# Patient Record
Sex: Female | Born: 1950 | Race: Black or African American | Hispanic: No | Marital: Married | Smoking: Former smoker
Health system: Southern US, Community
[De-identification: ages and names within clinical notes are randomized; demographics above are authoritative.]

## PROBLEM LIST (undated history)

## (undated) DIAGNOSIS — E113299 Type 2 diabetes mellitus with mild nonproliferative diabetic retinopathy without macular edema, unspecified eye: Secondary | ICD-10-CM

## (undated) DIAGNOSIS — E079 Disorder of thyroid, unspecified: Secondary | ICD-10-CM

## (undated) DIAGNOSIS — D509 Iron deficiency anemia, unspecified: Secondary | ICD-10-CM

## (undated) DIAGNOSIS — D8683 Sarcoid iridocyclitis: Secondary | ICD-10-CM

## (undated) DIAGNOSIS — H409 Unspecified glaucoma: Secondary | ICD-10-CM

## (undated) DIAGNOSIS — H269 Unspecified cataract: Secondary | ICD-10-CM

## (undated) DIAGNOSIS — K219 Gastro-esophageal reflux disease without esophagitis: Secondary | ICD-10-CM

## (undated) DIAGNOSIS — E785 Hyperlipidemia, unspecified: Secondary | ICD-10-CM

## (undated) DIAGNOSIS — K5790 Diverticulosis of intestine, part unspecified, without perforation or abscess without bleeding: Secondary | ICD-10-CM

## (undated) DIAGNOSIS — I10 Essential (primary) hypertension: Secondary | ICD-10-CM

## (undated) DIAGNOSIS — D860 Sarcoidosis of lung: Secondary | ICD-10-CM

## (undated) DIAGNOSIS — G4730 Sleep apnea, unspecified: Secondary | ICD-10-CM

## (undated) DIAGNOSIS — M5030 Other cervical disc degeneration, unspecified cervical region: Secondary | ICD-10-CM

## (undated) HISTORY — DX: Sarcoid iridocyclitis: D86.83

## (undated) HISTORY — DX: Other cervical disc degeneration, unspecified cervical region: M50.30

## (undated) HISTORY — DX: Sleep apnea, unspecified: G47.30

## (undated) HISTORY — DX: Iron deficiency anemia, unspecified: D50.9

## (undated) HISTORY — PX: OTHER SURGICAL HISTORY: SHX169

## (undated) HISTORY — PX: APPENDECTOMY: SHX54

## (undated) HISTORY — PX: TONSILLECTOMY: SUR1361

## (undated) HISTORY — PX: EYE SURGERY: SHX253

## (undated) HISTORY — DX: Unspecified glaucoma: H40.9

## (undated) HISTORY — DX: Sarcoidosis of lung: D86.0

## (undated) HISTORY — DX: Gastro-esophageal reflux disease without esophagitis: K21.9

## (undated) HISTORY — PX: VITRECTOMY: SHX106

## (undated) HISTORY — DX: Unspecified cataract: H26.9

## (undated) HISTORY — DX: Diverticulosis of intestine, part unspecified, without perforation or abscess without bleeding: K57.90

## (undated) HISTORY — PX: CATARACT EXTRACTION: SUR2

## (undated) HISTORY — DX: Hyperlipidemia, unspecified: E78.5

## (undated) HISTORY — PX: TOTAL ABDOMINAL HYSTERECTOMY: SHX209

## (undated) HISTORY — DX: Disorder of thyroid, unspecified: E07.9

## (undated) HISTORY — PX: SCLERAL BUCKLE: SHX5340

## (undated) HISTORY — PX: COLONOSCOPY: SHX174

## (undated) HISTORY — DX: Essential (primary) hypertension: I10

## (undated) HISTORY — PX: ABDOMINAL HYSTERECTOMY: SHX81

## (undated) HISTORY — DX: Type 2 diabetes mellitus with mild nonproliferative diabetic retinopathy without macular edema, unspecified eye: E11.3299

## (undated) HISTORY — PX: TUBAL LIGATION: SHX77

---

## 1998-07-22 ENCOUNTER — Ambulatory Visit (HOSPITAL_COMMUNITY): Admission: RE | Admit: 1998-07-22 | Discharge: 1998-07-22 | Payer: Self-pay | Admitting: Obstetrics and Gynecology

## 1998-08-06 ENCOUNTER — Ambulatory Visit (HOSPITAL_COMMUNITY): Admission: RE | Admit: 1998-08-06 | Discharge: 1998-08-06 | Payer: Self-pay | Admitting: Obstetrics and Gynecology

## 1998-08-09 ENCOUNTER — Inpatient Hospital Stay (HOSPITAL_COMMUNITY): Admission: RE | Admit: 1998-08-09 | Discharge: 1998-08-12 | Payer: Self-pay | Admitting: Obstetrics and Gynecology

## 1998-08-22 ENCOUNTER — Emergency Department (HOSPITAL_COMMUNITY): Admission: EM | Admit: 1998-08-22 | Discharge: 1998-08-22 | Payer: Self-pay | Admitting: Emergency Medicine

## 1999-03-31 ENCOUNTER — Encounter: Payer: Self-pay | Admitting: Emergency Medicine

## 1999-03-31 ENCOUNTER — Inpatient Hospital Stay (HOSPITAL_COMMUNITY): Admission: EM | Admit: 1999-03-31 | Discharge: 1999-04-01 | Payer: Self-pay | Admitting: Emergency Medicine

## 1999-04-17 ENCOUNTER — Emergency Department (HOSPITAL_COMMUNITY): Admission: EM | Admit: 1999-04-17 | Discharge: 1999-04-17 | Payer: Self-pay | Admitting: Emergency Medicine

## 1999-04-17 ENCOUNTER — Encounter: Payer: Self-pay | Admitting: Emergency Medicine

## 1999-09-19 ENCOUNTER — Ambulatory Visit (HOSPITAL_COMMUNITY): Admission: RE | Admit: 1999-09-19 | Discharge: 1999-09-19 | Payer: Self-pay | Admitting: Obstetrics and Gynecology

## 1999-09-19 ENCOUNTER — Encounter: Payer: Self-pay | Admitting: Obstetrics and Gynecology

## 2000-10-23 ENCOUNTER — Ambulatory Visit (HOSPITAL_COMMUNITY): Admission: RE | Admit: 2000-10-23 | Discharge: 2000-10-23 | Payer: Self-pay | Admitting: General Surgery

## 2000-10-23 ENCOUNTER — Encounter: Payer: Self-pay | Admitting: Internal Medicine

## 2001-03-25 ENCOUNTER — Ambulatory Visit (HOSPITAL_COMMUNITY): Admission: RE | Admit: 2001-03-25 | Discharge: 2001-03-25 | Payer: Self-pay | Admitting: Internal Medicine

## 2001-10-28 ENCOUNTER — Ambulatory Visit (HOSPITAL_COMMUNITY): Admission: RE | Admit: 2001-10-28 | Discharge: 2001-10-28 | Payer: Self-pay | Admitting: Obstetrics and Gynecology

## 2001-10-28 ENCOUNTER — Encounter: Payer: Self-pay | Admitting: Obstetrics and Gynecology

## 2002-10-17 ENCOUNTER — Other Ambulatory Visit: Admission: RE | Admit: 2002-10-17 | Discharge: 2002-10-17 | Payer: Self-pay | Admitting: Internal Medicine

## 2002-10-31 ENCOUNTER — Encounter: Payer: Self-pay | Admitting: Obstetrics and Gynecology

## 2002-10-31 ENCOUNTER — Ambulatory Visit (HOSPITAL_COMMUNITY): Admission: RE | Admit: 2002-10-31 | Discharge: 2002-10-31 | Payer: Self-pay | Admitting: Obstetrics and Gynecology

## 2004-10-11 ENCOUNTER — Ambulatory Visit (HOSPITAL_BASED_OUTPATIENT_CLINIC_OR_DEPARTMENT_OTHER): Admission: RE | Admit: 2004-10-11 | Discharge: 2004-10-11 | Payer: Self-pay | Admitting: Internal Medicine

## 2004-11-28 ENCOUNTER — Emergency Department (HOSPITAL_COMMUNITY): Admission: EM | Admit: 2004-11-28 | Discharge: 2004-11-28 | Payer: Self-pay | Admitting: Emergency Medicine

## 2005-12-09 ENCOUNTER — Ambulatory Visit (HOSPITAL_COMMUNITY): Admission: RE | Admit: 2005-12-09 | Discharge: 2005-12-09 | Payer: Self-pay | Admitting: Ophthalmology

## 2006-03-21 ENCOUNTER — Ambulatory Visit (HOSPITAL_COMMUNITY): Admission: RE | Admit: 2006-03-21 | Discharge: 2006-03-21 | Payer: Self-pay | Admitting: Internal Medicine

## 2006-03-21 ENCOUNTER — Encounter (INDEPENDENT_AMBULATORY_CARE_PROVIDER_SITE_OTHER): Payer: Self-pay | Admitting: *Deleted

## 2006-06-29 ENCOUNTER — Ambulatory Visit (HOSPITAL_COMMUNITY): Admission: RE | Admit: 2006-06-29 | Discharge: 2006-06-29 | Payer: Self-pay | Admitting: Internal Medicine

## 2007-06-07 ENCOUNTER — Ambulatory Visit (HOSPITAL_COMMUNITY): Admission: RE | Admit: 2007-06-07 | Discharge: 2007-06-07 | Payer: Self-pay | Admitting: Ophthalmology

## 2007-06-24 ENCOUNTER — Ambulatory Visit (HOSPITAL_COMMUNITY): Admission: RE | Admit: 2007-06-24 | Discharge: 2007-06-24 | Payer: Self-pay | Admitting: Ophthalmology

## 2007-07-03 ENCOUNTER — Ambulatory Visit (HOSPITAL_COMMUNITY): Admission: RE | Admit: 2007-07-03 | Discharge: 2007-07-03 | Payer: Self-pay | Admitting: Ophthalmology

## 2007-08-27 ENCOUNTER — Ambulatory Visit: Payer: Self-pay | Admitting: Internal Medicine

## 2008-04-22 ENCOUNTER — Ambulatory Visit (HOSPITAL_COMMUNITY): Admission: RE | Admit: 2008-04-22 | Discharge: 2008-04-22 | Payer: Self-pay | Admitting: Internal Medicine

## 2011-04-25 NOTE — Assessment & Plan Note (Signed)
[name]                             PULMONARY OFFICE NOTE   [name], Eileen Jefferson                     MRN:          [id number]  DATE:08/27/2007                            DOB:          May 14, 1951    PROBLEM LIST:  1. Sarcoid.  2. Obstructive sleep apnea.  3. Esophageal reflux.   HISTORY:  First visit for this woman since 2005, before moving to this  office.  She comes now with concern of cough.  Cough had been an issue  in the past.  She had had nocturnal polysomnogram on October 11, 2004,  documenting moderate obstructive apnea with an index of 16.7 per hour,  titrated to CPAP of 9, and she has continued using that.  Cough seems to  be worse when she lays down, and is said to have flared in the last few  weeks.  She has not recognized a trigger, has not had an infection, but  does notice occasional reflux.  Reflux had been thought to be the cause  of her cough in 2005 as well.  Her angiotensin-converting enzyme  inhibitor level in 2002 and sarcoid was thought to be inactive.  Chest x-  ray done at [facility] on June 05, 2007 was read as no active cardiopulmonary  disease, done preop for cataract surgery.  Lungs were clear.  She is  noted to be on lisinopril/hydrochlorothiazide and says she has been on  that for a year.  She started timolol eye drops 1 week ago.  She is  pretty clear that the current exacerbation of cough did not coincide  easily with those.  She does recognize seasonal rhinitis or postnasal  drip, and does not feel unusually short of breath.  There has been  little sputum, nothing purulent or bloody, no chest pain, leg pain,  edema, adenopathy, rash or fever.   MEDICATIONS:  1. CPAP at 9 CWP.  2. Lisinopril/hydrochlorothiazide 10/12.5.  3. Timolol eye drops 0.5% for left eye.  4. Acular for the left eye.   No medication allergy.   OBJECTIVE:  Weight 220 pounds, BP 112/62, pulse 62, room air saturation  100%.  She is obese in  no evident distress.  Palate spacing 3/4, but I can see the posterior pharyngeal wall is not  inflamed with no visible drainage.  Speech quality is normal.  There is  no stridor, thyromegaly or neck vein distension.  CHEST:  Clear to percussion and auscultation.  No cough demonstrated  until the end of our visit when she had 1 single dry cough episode.  Heart sounds regular without murmur or gallop.   IMPRESSION:  1. Cough, probably multifactorial with an active component of reflux.      The timing is not obvious for this to be related either to      lisinopril or timolol, although they may be aggravating factors.  2. There is past history of sarcoid and of obstructive sleep apnea on      continuous positive airway pressure.   PLAN:  1. For 3 weeks try changing lisinopril/hydrochlorothiazide to Benicar  20 mg/hydrochlorothiazide 12.5 mg once daily as discussed.  2. After that trial, if there is no difference in her cough, then she      will try a reflux management program, following reflux precautions,      elevating head of bed on brick and taking over-the-counter Prilosec      once daily.  3. Schedule return 2 months, earlier p.r.n.     Clinton D. Maple Hudson, MD, Tonny Bollman, FACP  Electronically Signed    CDY/MedQ  DD: 08/27/2007  DT: 08/27/2007  Job #: [id number]   cc:   Jonita Albee, M.D.

## 2011-04-25 NOTE — Op Note (Signed)
NAME:  Eileen Jefferson, Eileen Jefferson              ACCOUNT NO.:  [id number]   MEDICAL RECORD NO.:  [id number]          PATIENT TYPE:  AMB   LOCATION:  SDS                          FACILITY:  [facility]   PHYSICIAN:  Jillyn Hidden A. Rankin, M.D.   DATE OF BIRTH:  1951-09-18   DATE OF PROCEDURE:  06/24/2007  DATE OF DISCHARGE:                               OPERATIVE REPORT   PREOPERATIVE DIAGNOSES:  1. Dense, nonclearing vitreous hemorrhage, left eye.  2. 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   POSTOPERATIVE DIAGNOSES:  1. Dense, nonclearing vitreous hemorrhage, left eye.  2. 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  3. Retinal hole at the base of the fibrovascular proliferations      elsewhere temporally at the 9-o'clock position anterior to the      equator.   PROCEDURE:  1. Posterior vitrectomy with membrane peel, epiretinal membrane and      fibrovascular proliferations, OS (left eye).  2. Endolaser panphotocoagulation, OS (left eye).   SURGEON:  Alford Highland. Rankin, M.D.   ANESTHESIA:  General endotracheal anesthesia.   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  She understands this is an attempt to clear  the vitreous of opacification, but also to remove the fibrovascular  proliferations.  She understands the risks of anesthesia, including the  rare occurrence of death, but also to the eye, including but not limited  to hemorrhage, infection, scarring, need for further surgery, no change  in vision, loss of vision and progression of disease despite  intervention.  Appropriate signed consent was obtained and the patient  was taken to the operating room.   DESCRIPTION OF PROCEDURES:  In the operating room, appropriate  monitoring was followed by mild sedation and then general endotracheal  anesthesia.  The left periocular region was sterilely prepped and draped  in the usual ophthalmic fashion.  A lid speculum was applied.  A 25-  gauge trocar was placed in the inferotemporal quadrant.  A superior  trocar was applied.  Core vitrectomy was then begun.  Physician-induced  posterior hyaloid removal was necessary, and this was carried out at the  posterior pole, and then carried anteriorly anterior to the equator 360  degrees.  The vitreous skirt was trimmed.  The fibrovascular  proliferation temporally was identified and transected using scissors  delamination, as well as small-point vitrectomy.   The vitreous skirt was trimmed 360 degrees.   The anterior hyaloid was removed.  A fluid-air exchange was completed so  that the retinal hole could be drained, and then laser photocoagulation  placed in a retinopexy fashion around that, which was done without  difficulty, and thereafter, an air-fluid exchange, and fluid left in the  eye so as to leave the eye filled with fluid.   At this time, the retina was attached completely.  Hemostasis was  spontaneous.  The vitreous fluid was clear.  The superior trocar was  removed.  The infusion was then removed.  Subconjunctival Decadron  applied.  A sterile patch and a Fox shield were applied.  The patient  was awakened from anesthesia without difficulty and taken to the PACU.      Alford Highland Rankin, M.D.  Electronically Signed     GAR/MEDQ  D:  06/24/2007  T:  06/24/2007  Job:  [id number]

## 2011-04-25 NOTE — Op Note (Signed)
NAME:  Eileen Jefferson, Eileen Jefferson              ACCOUNT NO.:  [id number]   MEDICAL RECORD NO.:  [id number]          PATIENT TYPE:  AMB   LOCATION:  SDS                          FACILITY:  [facility]   PHYSICIAN:  Salley Scarlet., M.D.DATE OF BIRTH:  May 06, 1951   DATE OF PROCEDURE:  06/07/2007  DATE OF DISCHARGE:                               OPERATIVE REPORT   PREOPERATIVE DIAGNOSIS:  Immature cataract, left eye.   POSTOPERATIVE DIAGNOSIS:  Immature cataract, left eye.   OPERATION:  Kellman phacoemulsification cataract, left eye, with  intraocular lens implantation.   ANESTHESIA:  Local using Xylocaine 2% with Marcaine 0.75% and Wydase.   JUSTIFICATION FOR PROCEDURE:  This is a 60 year old lady who has been  followed for several years for progressive cataract formation.  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  She has recently seen Dr. Luciana Axe who recommended  vitrectomy because of proliferative disease.  However, because of the  posterior subcapsular cataract, he is recommending that I remove the  cataract at this time so it would enable him to see better to do his  vitrectomy.  The situation has been carefully explained to the patient.  Although she has a visual acuity best corrected to 20/50, she  understands the reason for surgery at this time and she is, therefore,  admitted for cataract extraction of the left eye with intraocular lens  implantation.   PROCEDURE:  Under the influence of IV sedation, a Van Lint akinesia and  retrobulbar anesthesia was given.  The patient was prepped and draped in  the usual manner.  The lid speculum was inserted under the upper and  lower lid of the left eye and a 4-0 silk traction suture was passed  through the belly of the superior rectus muscle for retraction.  A small  fornix based conjunctival flap was turned and hemostasis achieved using  cautery.  An incision was made in the sclera at  the limbus.  This  incision was dissected down to clear cornea using a crescent blade.  A  sideport incision was made at the 1:30 o'clock position.  Ocucoat was  injected into the eye through the sideport incision.  The anterior  chamber was then entered through the corneoscleral tunnel incision at  11:30 o'clock position using a 2.7 mm keratome.  An anterior capsulotomy  was done using a bent 25 gauge needle.  The nucleus was hydrodissected  using Xylocaine.  The KTP handpiece was passed in the eye and the  nucleus was emulsified without difficulty.  The residual cortical  material was aspirated.  The posterior capsule was polished using an  olive tipped polisher.  The anterior chamber was reformed by using  Ocucoat.  The wound was widened slightly to accommodate a foldable  acrylic lens.  This lens was seated into the eye behind the iris without  difficulty.  The anterior chamber was reformed and the pupil was  constricted using Miochol.  The lips of the wound were hydrated and  tested to make sure there was no leak.  After ascertaining there was no  leak, the conjunctiva was closed over the wound using thermal cautery.  1 mL of Celestone and 0.5 mL of gentamicin were injected  subconjunctivally.  Maxitrol ophthalmic ointment and Polocaine ointment  were applied along with a patch and Fox shield.  The patient tolerated  the procedure well and  was discharged to the post anesthesia recovery room in satisfactory  condition.  She is instructed to rest today, to take Vicodin every 4  hours as needed for pain, and to see me in the office tomorrow for  further evaluation.   DISCHARGE DIAGNOSIS:  Immature cataract, left eye.      Salley Scarlet., M.D.  Electronically Signed     TB/MEDQ  D:  06/08/2007  T:  06/08/2007  Job:  [id number]

## 2011-04-28 NOTE — Procedures (Signed)
NAME:  AMMI, HUTT NO.:  [id number]   MEDICAL RECORD NO.:  [id number]          PATIENT TYPE:  OUT   LOCATION:  SLEEP CENTER                 FACILITY:  [facility]   PHYSICIAN:  Clinton D. Maple Hudson, M.D. DATE OF BIRTH:  1951/07/13   DATE OF STUDY:  10/11/2004                              NOCTURNAL POLYSOMNOGRAM   REFERRING PHYSICIAN:  Dr. Jetty Duhamel.   INDICATION FOR STUDY:  Hypersomnia with sleep apnea.   EPWORTH SLEEPINESS SCORE:  14/24   BMI:  39.   WEIGHT:  215 pounds.   SLEEP ARCHITECTURE:  Total sleep time 277 minutes with sleep efficiency  76%.  Stage I was 10%, Stage II 43%, Stages III and IV 31%, REM was 17% of  total sleep time.  Sleep latency 1.5 minutes.  REM latency 120 minutes.  Awake after sleep onset 81 minutes, arousal index 14.   RESPIRATORY DATA:  Split-study protocol. RDI 16.7/hr indicating mild to  moderate obstructive sleep apnea/hypopnea syndrome before CPAP. This  included 4 obstructive apneas and 36 hypopneas before CPAP. Events were not  positional. REM RDI was 28.7. CPAP was titrated to 9 CWP, RDI 2.5/hr using a  medium Respironics nasal mask.   OXYGEN DATA:  Extremely loud snoring with oxygen desaturation to a nadir of  85% before CPAP.  A heated humidifier was used.   CARDIAC DATA:  Normal cardiac rhythm.   MOVEMENT/PARASOMNIA:  Occasional leg jerks.   IMPRESSION/RECOMMENDATION:  Moderate obstructive sleep apnea/hypopnea  syndrome, respiratory disturbance index 16.7/hr with desaturation to 85%.  CPAP control at 9 CWP, respiratory disturbance index 2.5/hr, using a medium  Respironics Nasal Mask with heated humidifier.                                                           Clinton D. Maple Hudson, M.D.  Diplomate, American Board  CDY/MEDQ  D:  10/16/2004 09:49:49  T:  10/17/2004 07:58:41  Job:  [id number]

## 2011-06-13 ENCOUNTER — Encounter: Payer: Self-pay | Admitting: Internal Medicine

## 2011-06-13 ENCOUNTER — Ambulatory Visit (INDEPENDENT_AMBULATORY_CARE_PROVIDER_SITE_OTHER): Payer: Medicare Other | Admitting: Internal Medicine

## 2011-06-13 DIAGNOSIS — G4730 Sleep apnea, unspecified: Secondary | ICD-10-CM

## 2011-06-13 DIAGNOSIS — D509 Iron deficiency anemia, unspecified: Secondary | ICD-10-CM

## 2011-06-13 DIAGNOSIS — K222 Esophageal obstruction: Secondary | ICD-10-CM

## 2011-06-13 DIAGNOSIS — K5730 Diverticulosis of large intestine without perforation or abscess without bleeding: Secondary | ICD-10-CM

## 2011-06-13 MED ORDER — PEG-KCL-NACL-NASULF-NA ASC-C 100 G PO SOLR: 1.0000 | Freq: Once | ORAL | Status: DC

## 2011-06-13 NOTE — Patient Instructions (Signed)
You have been scheduled for a Upper Endoscopy/ Colonoscopy with propofol. Separate instructions given. Pick up your prep kit from your pharmacy.  Hold iron supplement one week prior to your procedures. cc: Lesle Chris, MD

## 2011-06-15 ENCOUNTER — Encounter: Payer: Self-pay | Admitting: Internal Medicine

## 2011-06-15 NOTE — Progress Notes (Signed)
HISTORY OF PRESENT ILLNESS:  Eileen Jefferson is a 60 y.o. female with multiple medical problems as listed below. She is sent today for consultation regarding probable iron deficiency anemia. The patient was evaluated in December 2003 for anemia with a hemoglobin of 11.2, MCV 70.2, and iron saturation 12%. In January 2004 she underwent complete colonoscopy. This was negative except for left-sided diverticulosis. Upper endoscopy revealed an incidental esophageal stricture was otherwise normal. She was placed on iron and sent back to her primary provider. Currently with a different primary provider. Laboratories in May of 2012 reveal hemoglobin 11.2 and MCV 70.8. BUN and creatinine normal. Iron saturation low at 10%. She was placed on this appointment made. Her GI review of systems is remarkable only for occasional regurgitation and belching. She has known hemorrhoids. She denies melena, hematochezia, abdominal pain, change in bowel habits, or weight loss. No new medications. She denies aspirin or NSAID use. No family history of colon cancer.  REVIEW OF SYSTEMS:  All non-GI ROS negative except for cough and visual impairment  Past Medical History  Diagnosis Date  . Pulmonary sarcoidosis   . Diverticulosis   . Iron deficiency anemia   . Hyperlipidemia   . HTN (hypertension)   . Sleep apnea     Past Surgical History  Procedure Date  . C-section     x 2  . Appendectomy   . Tonsillectomy   . Total abdominal hysterectomy   . Tubal ligation   . Cataract extraction     bilateral  . Vitrectomy     bilateral  . Scleral buckle     right    Social History Eileen Jefferson  reports that she has never smoked. She has never used smokeless tobacco. She reports that she does not drink alcohol or use illicit drugs.  family history includes Clotting disorder in her mother; Colon polyps in her mother; Diabetes in her brother; Heart disease in her brother; and Stroke in her maternal grandmother.   There is no history of Colon cancer.  Allergies  Allergen Reactions  . Iohexol Other (See Comments)    " made me feel like I was burning inside"       PHYSICAL EXAMINATION: Vital signs: BP 130/76  Pulse 84  Ht 5\' 2"  (1.575 m)  Wt 218 lb (98.884 kg)  BMI 39.87 kg/m2  Constitutional: generally well-appearing, no acute distress Psychiatric: alert and oriented x3, cooperative Eyes: extraocular movements intact, anicteric, conjunctiva pink Mouth: oral pharynx moist, no lesions Neck: supple no lymphadenopathy Cardiovascular: heart regular rate and rhythm, no murmur Lungs: clear to auscultation bilaterally Abdomen: soft, nontender, nondistended, no obvious ascites, no peritoneal signs, normal bowel sounds, no organomegaly Rectal: Deferred until colonoscopy Extremities: no lower extremity edema bilaterally Skin: no lesions on visible extremities Neuro: No focal deficits.   ASSESSMENT:  #1. Microcytic anemia with low iron saturation #2. Colonoscopy in 2004 revealing diverticulosis #3. Upper endoscopy in 2004 revealing peptic stricture #4. Multiple comorbidities including sleep apnea and obesity   PLAN:  #1. Colonoscopy and upper endoscopy.The nature of the procedures, as well as the risks, benefits, and alternatives were carefully and thoroughly reviewed with the patient. Ample time for discussion and questions allowed. The patient understood, was satisfied, and agreed to proceed. Movi prep prescribed. The patient instructed on its use #2. Procedural sedation with propofol via CRNA supervision given the high-risk with sleep apnea and obesity. Discussed with patient. She is agreeable.

## 2011-06-20 ENCOUNTER — Encounter: Payer: Self-pay | Admitting: Internal Medicine

## 2011-07-28 ENCOUNTER — Encounter: Payer: Self-pay | Admitting: Internal Medicine

## 2011-07-28 ENCOUNTER — Ambulatory Visit (AMBULATORY_SURGERY_CENTER): Payer: Medicare Other | Admitting: Internal Medicine

## 2011-07-28 VITALS — BP 127/70 | HR 85 | Temp 98.2°F | Resp 18 | Ht 62.0 in | Wt 218.0 lb

## 2011-07-28 DIAGNOSIS — K5730 Diverticulosis of large intestine without perforation or abscess without bleeding: Secondary | ICD-10-CM

## 2011-07-28 DIAGNOSIS — R112 Nausea with vomiting, unspecified: Secondary | ICD-10-CM

## 2011-07-28 DIAGNOSIS — D649 Anemia, unspecified: Secondary | ICD-10-CM

## 2011-07-28 DIAGNOSIS — D509 Iron deficiency anemia, unspecified: Secondary | ICD-10-CM

## 2011-07-28 MED ORDER — SODIUM CHLORIDE 0.9 % IV SOLN: 500.0000 mL | INTRAVENOUS | Status: DC

## 2011-07-28 MED ORDER — PROMETHAZINE HCL 25 MG/ML IJ SOLN: INTRAMUSCULAR | Status: DC

## 2011-07-28 NOTE — Progress Notes (Signed)
11:00am    Patient is in recovery room and states extreme nausea.   Dr. Marina Goodell was informed, and a verbal order was received for phenergan 12.5mg  IV stat for pt.    Phenergan 12.5 obtained and diluted with 20 ml of sterile water; given slowly IV to patient.

## 2011-07-28 NOTE — Patient Instructions (Signed)
Your upper scope was normal, but Dr. Marina Goodell did do some biopsies to r/o celiac disease.  You will receive the results by mail within 2 weeks.   Your colonoscopy did show some diverticulosis.    It would be beneficial to you to increase the fiber in your diet.  Resume your routine medications today.  Read the handouts given to you by your recovery room nurse.  IF you have any questions or concerns, please call us at [phone].  Thank-you.

## 2011-07-31 ENCOUNTER — Telehealth: Payer: Self-pay

## 2011-07-31 NOTE — Telephone Encounter (Signed)

## 2011-09-26 LAB — BASIC METABOLIC PANEL
BUN: 11
Chloride: 101
Creatinine, Ser: 0.99
GFR calc Af Amer: >60
GFR calc non Af Amer: 58 — ABNORMAL LOW

## 2011-09-26 LAB — CBC
Hemoglobin: 10.4 — ABNORMAL LOW
RBC: 4.73
WBC: 6.9

## 2011-09-27 LAB — CBC
HCT: 36.3
Hemoglobin: 11.2 — ABNORMAL LOW
MCHC: 30.9
RDW: 15.9 — ABNORMAL HIGH

## 2011-09-27 LAB — BASIC METABOLIC PANEL
CO2: 29
Chloride: 99
Glucose, Bld: 103 — ABNORMAL HIGH
Potassium: 3.5
Sodium: 135

## 2011-09-27 LAB — URINALYSIS, ROUTINE W REFLEX MICROSCOPIC
Bilirubin Urine: NEGATIVE
Glucose, UA: NEGATIVE
Hgb urine dipstick: NEGATIVE
Protein, ur: NEGATIVE
Specific Gravity, Urine: 1.015 (ref 1.005–1.035)
Urobilinogen, UA: 0.2

## 2012-01-29 ENCOUNTER — Ambulatory Visit (INDEPENDENT_AMBULATORY_CARE_PROVIDER_SITE_OTHER): Payer: Medicare Other | Admitting: Family Medicine

## 2012-01-29 DIAGNOSIS — R05 Cough: Secondary | ICD-10-CM

## 2012-01-29 DIAGNOSIS — J029 Acute pharyngitis, unspecified: Secondary | ICD-10-CM

## 2012-01-29 DIAGNOSIS — D869 Sarcoidosis, unspecified: Secondary | ICD-10-CM

## 2012-01-29 MED ORDER — AZITHROMYCIN 250 MG PO TABS: ORAL_TABLET | ORAL | Status: AC

## 2012-01-29 NOTE — Progress Notes (Signed)
  Subjective:    Patient ID: Eileen Jefferson, female    DOB: [birth year], 61 y.o.   MRN: [id number]  HPI  Eileen Jefferson is a 61 y.o. female Sore/scratchy throat x 3 days.  Coughing yellow phlegm.  Hx sarcoidosis, but usually affects eyes.  Has not wheezed in years No fever/wheeze currently.  Tx: Nyquil  Son with cold now.    Review of Systems  Constitutional: Negative for fever and chills.  HENT: Positive for congestion, sore throat, voice change and postnasal drip. Negative for rhinorrhea, sneezing and sinus pressure.   Respiratory: Positive for cough. Negative for chest tightness, shortness of breath and wheezing.   Cardiovascular: Negative for chest pain.  Skin: Negative for rash.  .     Objective:   Physical Exam  Constitutional: She is oriented to person, place, and time. She appears well-developed and well-nourished. No distress.  HENT:  Head: Normocephalic and atraumatic.  Right Ear: Hearing, tympanic membrane, external ear and ear canal normal.  Left Ear: Hearing, tympanic membrane, external ear and ear canal normal.  Nose: Nose normal.  Mouth/Throat: Oropharynx is clear and moist. No oropharyngeal exudate.  Eyes: Conjunctivae and EOM are normal. Pupils are equal, round, and reactive to light.  Cardiovascular: Normal rate, regular rhythm, normal heart sounds and intact distal pulses.   No murmur heard. Pulmonary/Chest: Effort normal and breath sounds normal. No respiratory distress. She has no wheezes. She has no rhonchi.  Neurological: She is alert and oriented to person, place, and time.  Skin: Skin is warm and dry. No rash noted.  Psychiatric: She has a normal mood and affect. Her behavior is normal.    Results for orders placed in visit on 01/29/12  POCT RAPID STREP A (OFFICE)      Component Value Range   Rapid Strep A Screen Negative  Negative          Assessment & Plan:   1. Pharyngitis  POCT rapid strep A  2. Cough    3. Sarcoid     Likely viral  at present.  Sx care - fluids,lozenges, rest, rtc precautions.  Hx of sarcoid, but no recent flare.  Rx Zpak # 1, can start in next 3 days if not improving. Return to the clinic or go to the nearest emergency room if symptoms worsen or new symptoms occur.

## 2012-01-29 NOTE — Patient Instructions (Signed)
Use lozenges, drink plenty of fluids for sore throat.  Can try mucinex over the counter as needed for cough, but if any fever, shortness of breath, or wheezing - return to clinic or ER.  Can fill antibiotic if not improving in few days.  Sore Throat Sore throats may be caused by bacteria and viruses. They may also be caused by:  Smoking.   Pollution.   Allergies.  If a sore throat is due to strep infection (a bacterial infection), you may need:  A throat swab.   A culture test to verify the strep infection.  You will need one of these:  An antibiotic shot.   Oral medicine for a full 10 days.  Strep infection is very contagious. A doctor should check any close contacts who have a sore throat or fever. A sore throat caused by a virus infection will usually last only 3-4 days. Antibiotics will not treat a viral sore throat.  Infectious mononucleosis (a viral disease), however, can cause a sore throat that lasts for up to 3 weeks. Mononucleosis can be diagnosed with blood tests. You must have been sick for at least 1 week in order for the test to give accurate results. HOME CARE INSTRUCTIONS   To treat a sore throat, take mild pain medicine.   Increase your fluids.   Eat a soft diet.   Do not smoke.      Gargling with warm water or salt water (1 tsp. salt in 8 oz. water) can be helpful.   Try throat sprays or lozenges or sucking on hard candy to ease the symptoms.  Call your doctor if your sore throat lasts longer than 1 week.  SEEK IMMEDIATE MEDICAL CARE IF:  You have difficulty breathing.   You have increased swelling in the throat.   You have pain so severe that you are unable to swallow fluids or your saliva.   You have a severe headache, a high fever, vomiting, or a red rash.  Document Released: 01/04/2005 Document Revised: 08/09/2011 Document Reviewed: 11/14/2007 [facility] Patient Information 2012 [place], [place].

## 2012-04-29 ENCOUNTER — Other Ambulatory Visit: Payer: Self-pay | Admitting: Emergency Medicine

## 2012-10-23 ENCOUNTER — Encounter: Payer: Self-pay | Admitting: Pulmonary Disease

## 2012-10-23 ENCOUNTER — Ambulatory Visit (INDEPENDENT_AMBULATORY_CARE_PROVIDER_SITE_OTHER): Payer: BC Managed Care – PPO | Admitting: Pulmonary Disease

## 2012-10-23 VITALS — BP 124/82 | HR 93 | Temp 98.9°F | Ht 62.0 in | Wt 230.0 lb

## 2012-10-23 DIAGNOSIS — G4733 Obstructive sleep apnea (adult) (pediatric): Secondary | ICD-10-CM | POA: Insufficient documentation

## 2012-10-23 NOTE — Patient Instructions (Signed)
Will arrange for sleep study Will call to arrange for follow up after sleep study reviewed 

## 2012-10-23 NOTE — Progress Notes (Deleted)
  Subjective:    Patient ID: Eileen Jefferson, female    DOB: [birth year], 61 y.o.   MRN: [id number]  HPI    Review of Systems  Constitutional: Negative for fever and unexpected weight change.  HENT: Positive for rhinorrhea. Negative for ear pain, nosebleeds, congestion, sore throat, sneezing, trouble swallowing, dental problem, postnasal drip and sinus pressure.   Eyes: Negative for redness and itching.  Respiratory: Positive for cough. Negative for chest tightness, shortness of breath and wheezing.   Cardiovascular: Negative for palpitations and leg swelling.  Gastrointestinal: Negative for nausea and vomiting.  Genitourinary: Negative for dysuria.  Musculoskeletal: Positive for myalgias and arthralgias. Negative for joint swelling.  Skin: Negative for rash.  Neurological: Negative for headaches.  Hematological: Does not bruise/bleed easily.  Psychiatric/Behavioral: Negative for dysphoric mood. The patient is not nervous/anxious.        Objective:   Physical Exam        Assessment & Plan:

## 2012-10-23 NOTE — Progress Notes (Signed)
Primary Care Physician:   Referring provider:    Chief Complaint  Patient presents with  . Advice Only    former pt of [facility] last seen 2008. last sleep study 10/11/2004.     History of Present Illness: Eileen Jefferson is a 61 y.o. female for evaluation of sleep apnea.  She had a sleep study in 2005, and was diagnosed with sleep apnea.  She was started on CPAP with full face mask.  She only tried one type of mask.  She did not like the way the mask fit, and felt like the pressure was too much.  As a result she stopped using CPAP.  Since then her sleep has gotten worse.  Her family reports that she continues to snore and stop breathing while asleep.  She feels tired during the day.  She falls asleep easily while watching TV or reading.  She naps for an hour every day since she retired.  She used to work night shift.  She goes to bed at 11 pm.  She does not use anything to help sleep.  She falls asleep quickly.  She wakes up several times.  She gets up at 3 am to see her husband off to work, and then goes back to bed until 730 am.  She sometimes feel tired in the morning, but denies morning headache.  Her Epworth score is 12 out of 24.  The patient denies sleep walking, sleep talking, bruxism, or nightmares.  There is no history of restless legs.  The patient denies sleep hallucinations, sleep paralysis, or cataplexy.  She has a history of sarcoidosis with ocular involvement.  As a result she can not drive due to visual problems.  Tests: PSG 10/11/04>>AHI 16.7, SpO2 low 85%  Past Medical History  Diagnosis Date  . Pulmonary sarcoidosis   . Diverticulosis   . Iron deficiency anemia   . Hyperlipidemia   . HTN (hypertension)   . Sleep apnea     Past Surgical History  Procedure Date  . C-section     x 2  . Appendectomy   . Tonsillectomy   . Total abdominal hysterectomy   . Tubal ligation   . Cataract extraction     bilateral  . Vitrectomy     bilateral  . Scleral buckle    right    Current Outpatient Prescriptions on File Prior to Visit  Medication Sig Dispense Refill  . brimonidine-timolol (COMBIGAN) 0.2-0.5 % ophthalmic solution Place 1 drop into the left eye 2 (two) times daily.        . brinzolamide (AZOPT) 1 % ophthalmic suspension Place 1 drop into the left eye 2 (two) times daily.        Marland Kitchen lisinopril-hydrochlorothiazide (PRINZIDE,ZESTORETIC) 10-12.5 MG per tablet TAKE 1 TABLET BY MOUTH DIALY  30 tablet  0  . prednisoLONE acetate (PRED FORTE) 1 % ophthalmic suspension Place 1 drop into the right eye daily.        . ferrous sulfate 325 (65 FE) MG tablet Take 325 mg by mouth daily.          Allergies  Allergen Reactions  . Iohexol Other (See Comments)    " made me feel like I was burning inside"    Family History  Problem Relation Age of Onset  . Colon polyps Mother     brothers x2  . Colon cancer Neg Hx   . Heart disease Brother     mother, MGM  . Diabetes Brother  x 2, MGM  . Clotting disorder Mother     brother x 2, MGM  . Stroke Maternal Grandmother     History  Substance Use Topics  . Smoking status: Former Smoker    Types: Cigarettes    Quit date: 12/11/1968  . Smokeless tobacco: Never Used  . Alcohol Use: No    Review of Systems  Constitutional: Negative for fever and unexpected weight change.  HENT: Positive for rhinorrhea. Negative for ear pain, nosebleeds, congestion, sore throat, sneezing, trouble swallowing, dental problem, postnasal drip and sinus pressure.   Eyes: Negative for redness and itching.  Respiratory: Positive for cough. Negative for chest tightness, shortness of breath and wheezing.   Cardiovascular: Negative for palpitations and leg swelling.  Gastrointestinal: Negative for nausea and vomiting.  Genitourinary: Negative for dysuria.  Musculoskeletal: Positive for myalgias and arthralgias. Negative for joint swelling.  Skin: Negative for rash.  Neurological: Negative for headaches.  Hematological: Does  not bruise/bleed easily.  Psychiatric/Behavioral: Negative for dysphoric mood. The patient is not nervous/anxious.    Physical Exam: Filed Vitals:   10/23/12 1615  BP: 124/82  Pulse: 93  Temp: 98.9 F (37.2 C)  Height: 5\' 2"  (1.575 m)  Weight: 230 lb (104.327 kg)  SpO2: 94%  ,  Current Encounter SPO2  10/23/12 1615 94%  07/28/11 0956 100%    Wt Readings from Last 3 Encounters:  10/23/12 230 lb (104.327 kg)  01/29/12 228 lb 6.4 oz (103.602 kg)  07/28/11 218 lb (98.884 kg)    Body mass index is 42.07 kg/(m^2).   General - No distress ENT - No sinus tenderness, no oral exudate, no LAN, no thyromegaly Cardiac - s1s2 regular, no murmur, pulses symmetric Chest - No wheeze/rales/dullness, good air entry, normal respiratory excursion Back - No focal tenderness Abd - Soft, non-tender, no organomegaly, + bowel sounds Ext - No edema Neuro - Normal strength, cranial nerves intact Skin - No rashes Psych - Normal mood, and behavior.   Lab Results  Component Value Date   WBC 6.9 06/24/2007   HGB 10.4* 06/24/2007   HCT 32.9* 06/24/2007   MCV 69.5* 06/24/2007   PLT 310 06/24/2007    Lab Results  Component Value Date   CREATININE 0.99 06/24/2007   BUN 11 06/24/2007   NA 135 06/24/2007   K 3.7 06/24/2007   CL 101 06/24/2007   CO2 28 06/24/2007    Assessment/Plan:  Coralyn Helling, MD [place] [facility]/Critical Care/Sleep Pager:  [phone] 10/23/2012, 4:35 PM

## 2012-10-23 NOTE — Assessment & Plan Note (Signed)
She has prior history of sleep apnea, but had trouble tolerating CPAP mask.  She has continued snoring, witnessed apnea, sleep disruption, and daytime sleepiness.  She has history of hypertension and sarcoidosis.  I am concerned she still has sleep apnea.  I have explained how sleep apnea can affect the patient's health.  Driving precautions and importance of weight loss were discussed.  Treatment options for sleep apnea were reviewed.  To further assess will arrange for in lab sleep study.

## 2012-10-31 ENCOUNTER — Ambulatory Visit (HOSPITAL_BASED_OUTPATIENT_CLINIC_OR_DEPARTMENT_OTHER): Payer: BC Managed Care – PPO | Attending: Pulmonary Disease | Admitting: Radiology

## 2012-10-31 VITALS — Ht 62.0 in | Wt 230.0 lb

## 2012-10-31 DIAGNOSIS — I10 Essential (primary) hypertension: Secondary | ICD-10-CM | POA: Insufficient documentation

## 2012-10-31 DIAGNOSIS — Z9119 Patient's noncompliance with other medical treatment and regimen: Secondary | ICD-10-CM | POA: Insufficient documentation

## 2012-10-31 DIAGNOSIS — G4733 Obstructive sleep apnea (adult) (pediatric): Secondary | ICD-10-CM | POA: Insufficient documentation

## 2012-10-31 DIAGNOSIS — Z91199 Patient's noncompliance with other medical treatment and regimen due to unspecified reason: Secondary | ICD-10-CM | POA: Insufficient documentation

## 2012-10-31 DIAGNOSIS — D869 Sarcoidosis, unspecified: Secondary | ICD-10-CM | POA: Insufficient documentation

## 2012-10-31 DIAGNOSIS — E785 Hyperlipidemia, unspecified: Secondary | ICD-10-CM | POA: Insufficient documentation

## 2012-11-13 ENCOUNTER — Telehealth: Payer: Self-pay | Admitting: Pulmonary Disease

## 2012-11-13 DIAGNOSIS — G4733 Obstructive sleep apnea (adult) (pediatric): Secondary | ICD-10-CM

## 2012-11-13 NOTE — Procedures (Signed)
NAME:  Eileen, Jefferson NO.:  [id number]  MEDICAL RECORD NO.:  [id number]          PATIENT TYPE:  OUT  LOCATION:  SLEEP CENTER                 FACILITY:  [facility]  PHYSICIAN:  Coralyn Helling, MD        DATE OF BIRTH:  [birth year]  DATE OF STUDY:  10/31/2012                           NOCTURNAL POLYSOMNOGRAM  REFERRING PHYSICIAN:  Coralyn Helling, MD  FACILITY:  [facility]  REFERRING PHYSICIAN:  Coralyn Helling, MD  INDICATION:  [name] is a 61 year old female, who has a history of hypertension and sarcoidosis.  She was also diagnosed with obstructive sleep apnea after having a sleep study on October 11, 2004.  This showed moderate sleep apnea with an apnea/hypopnea index of 16.7, oxygen saturation nadir of 85%.  She had difficulty tolerating CPAP therapy at that time.  She continued to have snoring, sleep disruption, and daytime sleepiness.  She is therefore referred to the sleep lab for further evaluation of hypersomnia with obstructive sleep apnea.  Height is 5 feet 2 inches, weight is 230 pounds, BMI is 42, neck size is 15 inches.  MEDICATIONS:  Lisinopril/hydrochlorothiazide, prednisone, Azopt, and Combigan.  EPWORTH SLEEPINESS SCORE:  9.  SLEEP ARCHITECTURE:  Total recording time was 325 minutes.  Total sleep time was 311 minutes.  Sleep efficiency was 83%.  Sleep latency was 17 minutes.  REM latency was 111 minutes.  The study was notable for lack of slow-wave sleep.  She slept predominantly in the nonsupine position.  RESPIRATORY DATA:  The average respiratory rate was 18.  Loud snoring was noted by the technician.  The overall apnea/hypopnea index was 12.9. The events were exclusively obstructive in nature.  She had a preponderance of respiratory events during REM sleep.  There REM apnea/hypopnea index was 45.  The non-REM apnea/hypopnea index was 1.3.  OXYGEN DATA:  The baseline oxygenation was 91%.  The oxygen saturation nadir was 59%.  The study  was conducted without the use of supplemental oxygen.  CARDIAC DATA:  The average heart rate is 80 and the rhythm strip showed sinus rhythm.  MOVEMENT/PARASOMNIA:  The periodic limb movement index was 0 and the patient had no restroom trips.  IMPRESSION:  This study shows evidence for mild obstructive sleep apnea with an apnea/hypopnea index of 13 and oxygen saturation nadir of 59%. She did have predominance of her respiratory events during REM sleep.  In addition to diet, exercise, and weight reduction, additional therapeutic interventions could include CPAP therapy, oral appliance, or surgical intervention.     Coralyn Helling, MD Diplomat, American Board of Sleep Medicine    VS/MEDQ  D:  11/13/2012 12:40:18  T:  11/13/2012 65:78:46  Job:  [id number]

## 2012-11-13 NOTE — Telephone Encounter (Signed)
PSG 10/31/12>>AHI 12.9, REM 44.8, SpO2 low 59%, PLMI 0.  Will have my nurse schedule ROV to review results.

## 2012-11-14 NOTE — Telephone Encounter (Signed)
lmomtcb x1 

## 2012-11-20 ENCOUNTER — Encounter (HOSPITAL_BASED_OUTPATIENT_CLINIC_OR_DEPARTMENT_OTHER): Payer: BC Managed Care – PPO

## 2012-11-20 NOTE — Telephone Encounter (Signed)
i spoke with pt and she is scheduled 11/25/12 at 2:15

## 2012-11-25 ENCOUNTER — Encounter: Payer: Self-pay | Admitting: Pulmonary Disease

## 2012-11-25 ENCOUNTER — Ambulatory Visit (INDEPENDENT_AMBULATORY_CARE_PROVIDER_SITE_OTHER): Payer: BC Managed Care – PPO | Admitting: Pulmonary Disease

## 2012-11-25 VITALS — BP 130/84 | HR 85 | Temp 98.1°F | Ht 62.5 in | Wt 233.0 lb

## 2012-11-25 DIAGNOSIS — G4733 Obstructive sleep apnea (adult) (pediatric): Secondary | ICD-10-CM

## 2012-11-25 NOTE — Assessment & Plan Note (Signed)
She has mild sleep apnea, but severe sleep apnea in REM sleep.  I have reviewed her sleep test results with the patient.  Explained how sleep apnea can affect the patient's health.  Driving precautions and importance of weight loss were discussed.  Treatment options for sleep apnea were reviewed.  Will arrange for auto CPAP set up.

## 2012-11-25 NOTE — Progress Notes (Signed)
Chief Complaint  Patient presents with  . Follow-up    Go over Sleep Study results    History of Present Illness: Eileen Jefferson is a 61 y.o. female with OSA.  She is here to review her sleep study.   TESTS: PSG 10/11/04>>AHI 16.7, SpO2 low 85% PSG 10/31/12>>AHI 12.9, REM 44.8, SpO2 low 59%, PLMI 0.   Past Medical History  Diagnosis Date  . Pulmonary sarcoidosis   . Diverticulosis   . Iron deficiency anemia   . Hyperlipidemia   . HTN (hypertension)   . Sleep apnea     Past Surgical History  Procedure Date  . C-section     x 2  . Appendectomy   . Tonsillectomy   . Total abdominal hysterectomy   . Tubal ligation   . Cataract extraction     bilateral  . Vitrectomy     bilateral  . Scleral buckle     right    Outpatient Encounter Prescriptions as of 11/25/2012  Medication Sig Dispense Refill  . brimonidine-timolol (COMBIGAN) 0.2-0.5 % ophthalmic solution Place 1 drop into the left eye 2 (two) times daily.        . brinzolamide (AZOPT) 1 % ophthalmic suspension Place 1 drop into the left eye 2 (two) times daily.        . ferrous sulfate 325 (65 FE) MG tablet Take 325 mg by mouth daily.        Marland Kitchen lisinopril-hydrochlorothiazide (PRINZIDE,ZESTORETIC) 10-12.5 MG per tablet TAKE 1 TABLET BY MOUTH DIALY  30 tablet  0  . prednisoLONE acetate (PRED FORTE) 1 % ophthalmic suspension Place 1 drop into the right eye daily.          Allergies  Allergen Reactions  . Iohexol Other (See Comments)    " made me feel like I was burning inside"    Physical Exam:  Filed Vitals:   11/25/12 1421  BP: 130/84  Pulse: 85  Temp: 98.1 F (36.7 C)  Height: 5' 2.5" (1.588 m)  Weight: 233 lb (105.688 kg)  SpO2: 95%     Current Encounter SPO2  11/25/12 1421 95%  10/23/12 1615 94%  07/28/11 0956 100%     Body mass index is 41.94 kg/(m^2).   Wt Readings from Last 2 Encounters:  11/25/12 233 lb (105.688 kg)  10/31/12 230 lb (104.327 kg)    General - No distress  ENT -  No sinus tenderness, no oral exudate, no LAN, no thyromegaly  Cardiac - s1s2 regular, no murmur, pulses symmetric  Chest - No wheeze/rales/dullness, good air entry, normal respiratory excursion  Back - No focal tenderness  Abd - Soft, non-tender, no organomegaly, + bowel sounds  Ext - No edema  Neuro - Normal strength, cranial nerves intact  Skin - No rashes  Psych - Normal mood, and behavior.  Assessment/Plan:  Coralyn Helling, MD [place] [facility]/Critical Care/Sleep Pager:  [phone] 11/25/2012, 2:27 PM

## 2012-11-25 NOTE — Patient Instructions (Signed)
Will arrange for CPAP set up at home Follow up in 8 weeks 

## 2013-01-02 ENCOUNTER — Telehealth: Payer: Self-pay | Admitting: Pulmonary Disease

## 2013-01-02 NOTE — Telephone Encounter (Signed)
lmomtcb x1 

## 2013-01-02 NOTE — Telephone Encounter (Signed)
Auto CPAP 12/02/12 to 12/15/12 >> used on 3 of 14 nights with average 5 hrs 45 min.  Average AHI 0.6 with median CPAP 12 cm H2O and 95th percentile CPAP 13 cm H2O.  Will have my nurse inform pt that CPAP is controlling sleep apnea well, but she needs to use CPAP whenever asleep to get maximal benefit.  Will discuss in more detail at [facility] in February.

## 2013-01-08 NOTE — Telephone Encounter (Signed)
lmomtcb x2 on home # 

## 2013-01-09 ENCOUNTER — Encounter: Payer: Self-pay | Admitting: *Deleted

## 2013-01-09 NOTE — Telephone Encounter (Signed)
I spoke with patient about results and she verbalized understanding and had no questions 

## 2013-01-09 NOTE — Telephone Encounter (Signed)
lmomtcb x3 for pt. Will send letter out to pt. Will forward to VS so he is aware.

## 2013-01-20 ENCOUNTER — Ambulatory Visit: Payer: Medicare Other | Admitting: Pulmonary Disease

## 2013-02-05 ENCOUNTER — Encounter: Payer: Self-pay | Admitting: Pulmonary Disease

## 2013-02-05 ENCOUNTER — Ambulatory Visit (INDEPENDENT_AMBULATORY_CARE_PROVIDER_SITE_OTHER): Payer: BC Managed Care – PPO | Admitting: Pulmonary Disease

## 2013-02-05 VITALS — BP 138/76 | HR 88 | Temp 99.6°F | Ht 62.5 in | Wt 236.0 lb

## 2013-02-05 DIAGNOSIS — G4733 Obstructive sleep apnea (adult) (pediatric): Secondary | ICD-10-CM

## 2013-02-05 NOTE — Progress Notes (Signed)
Chief Complaint  Patient presents with  . Follow-up    pt reports she wears the CPAP everynight x 4-6 hrs sometimes more. Pt denies any problems w/ mask/.machine. Pt reports feeling rested during the day    History of Present Illness: Eileen Jefferson is a 62 y.o. female with OSA.  She has been doing better with CPAP.  She is now using about 6 hours per night.  She has nasal pillows, and this works better.  She is sleeping better, and feeling better.  She had a death in the family around the time she first got her CPAP, and was not able to use it then.    TESTS: PSG 10/11/04>>AHI 16.7, SpO2 low 85% PSG 10/31/12>>AHI 12.9, REM 44.8, SpO2 low 59%, PLMI 0. Auto CPAP 12/11/12 to 02/04/13 >> used on 37  of 59 nights with average 5 hrs 3 min. Average AHI 0.7 with median CPAP 10 cm H2O and 95th percentile CPAP 14 cm H2O.  Eileen Jefferson  has a past medical history of Pulmonary sarcoidosis; Diverticulosis; Iron deficiency anemia; Hyperlipidemia; HTN (hypertension); and Sleep apnea.  Eileen Jefferson  has past surgical history that includes c-section; Appendectomy; Tonsillectomy; Total abdominal hysterectomy; Tubal ligation; Cataract extraction; Vitrectomy; and Scleral buckle.  Prior to Admission medications   Medication Sig Start Date End Date Taking? Authorizing Provider  brimonidine-timolol (COMBIGAN) 0.2-0.5 % ophthalmic solution Place 1 drop into the left eye 2 (two) times daily.      Historical Provider, MD  brinzolamide (AZOPT) 1 % ophthalmic suspension Place 1 drop into the left eye 2 (two) times daily.      Historical Provider, MD  ferrous sulfate 325 (65 FE) MG tablet Take 325 mg by mouth daily.      Historical Provider, MD  lisinopril-hydrochlorothiazide (PRINZIDE,ZESTORETIC) 10-12.5 MG per tablet TAKE 1 TABLET BY MOUTH DIALY 04/29/12   Pattricia Boss, PA-C  prednisoLONE acetate (PRED FORTE) 1 % ophthalmic suspension Place 1 drop into the right eye daily.      Historical Provider, MD     Allergies  Allergen Reactions  . Iohexol Other (See Comments)    " made me feel like I was burning inside"     Physical Exam:  General - No distress ENT - No sinus tenderness, no oral exudate, no LAN Cardiac - s1s2 regular, no murmur Chest - No wheeze/rales/dullness Back - No focal tenderness Abd - Soft, non-tender Ext - No edema Neuro - Normal strength Skin - No rashes Psych - normal mood, and behavior   Assessment/Plan:  Coralyn Helling, MD [place] [facility]/Critical Care/Sleep Pager:  [phone] 02/05/2013, 4:44 PM

## 2013-02-05 NOTE — Assessment & Plan Note (Signed)
She reports compliance with therapy and improvement in her symptoms with CPAP.

## 2013-02-05 NOTE — Patient Instructions (Addendum)
Follow up in 1 year.

## 2013-03-06 ENCOUNTER — Encounter: Payer: Self-pay | Admitting: Internal Medicine

## 2013-10-02 ENCOUNTER — Ambulatory Visit (INDEPENDENT_AMBULATORY_CARE_PROVIDER_SITE_OTHER): Payer: BC Managed Care – PPO | Admitting: Internal Medicine

## 2013-10-02 VITALS — BP 146/86 | HR 84 | Temp 98.0°F | Resp 16 | Ht 62.5 in | Wt 235.2 lb

## 2013-10-02 DIAGNOSIS — Z79899 Other long term (current) drug therapy: Secondary | ICD-10-CM

## 2013-10-02 DIAGNOSIS — Z23 Encounter for immunization: Secondary | ICD-10-CM

## 2013-10-02 DIAGNOSIS — G4733 Obstructive sleep apnea (adult) (pediatric): Secondary | ICD-10-CM

## 2013-10-02 DIAGNOSIS — N898 Other specified noninflammatory disorders of vagina: Secondary | ICD-10-CM

## 2013-10-02 DIAGNOSIS — E785 Hyperlipidemia, unspecified: Secondary | ICD-10-CM

## 2013-10-02 DIAGNOSIS — E039 Hypothyroidism, unspecified: Secondary | ICD-10-CM

## 2013-10-02 DIAGNOSIS — D649 Anemia, unspecified: Secondary | ICD-10-CM

## 2013-10-02 DIAGNOSIS — D869 Sarcoidosis, unspecified: Secondary | ICD-10-CM

## 2013-10-02 DIAGNOSIS — R7302 Impaired glucose tolerance (oral): Secondary | ICD-10-CM

## 2013-10-02 DIAGNOSIS — E119 Type 2 diabetes mellitus without complications: Secondary | ICD-10-CM | POA: Insufficient documentation

## 2013-10-02 DIAGNOSIS — Z0000 Encounter for general adult medical examination without abnormal findings: Secondary | ICD-10-CM

## 2013-10-02 DIAGNOSIS — I10 Essential (primary) hypertension: Secondary | ICD-10-CM

## 2013-10-02 DIAGNOSIS — Z139 Encounter for screening, unspecified: Secondary | ICD-10-CM

## 2013-10-02 LAB — POCT WET PREP WITH KOH
KOH Prep POC: NEGATIVE
Trichomonas, UA: NEGATIVE
Yeast Wet Prep HPF POC: NEGATIVE

## 2013-10-02 LAB — LIPID PANEL
HDL: 40 mg/dL (ref >39)
Total CHOL/HDL Ratio: 5.7 Ratio
VLDL: 26 mg/dL (ref 0–40)

## 2013-10-02 LAB — POCT URINALYSIS DIPSTICK
Bilirubin, UA: NEGATIVE
Blood, UA: NEGATIVE
Leukocytes, UA: NEGATIVE
Nitrite, UA: NEGATIVE
Protein, UA: NEGATIVE
Urobilinogen, UA: 0.2
pH, UA: 5.5

## 2013-10-02 LAB — POCT UA - MICROSCOPIC ONLY
Casts, Ur, LPF, POC: NEGATIVE
Crystals, Ur, HPF, POC: NEGATIVE
Yeast, UA: NEGATIVE

## 2013-10-02 LAB — POCT CBC
MCH, POC: 21.2 pg — AB (ref 27–31.2)
MID (cbc): 0.4 (ref 0–0.9)
MPV: 9.5 fL (ref 0–99.8)
POC LYMPH PERCENT: 29.8 %L (ref 10–50)
POC MID %: 4.9 %M (ref 0–12)
Platelet Count, POC: 324 10*3/uL (ref 142–424)
RBC: 5.18 M/uL (ref 4.04–5.48)
RDW, POC: 16.9 %
WBC: 7.4 10*3/uL (ref 4.6–10.2)

## 2013-10-02 LAB — TSH: TSH: 1.59 u[IU]/mL (ref 0.350–4.500)

## 2013-10-02 LAB — COMPREHENSIVE METABOLIC PANEL
ALT: 14 U/L (ref 0–35)
AST: 14 U/L (ref 0–37)
Alkaline Phosphatase: 91 U/L (ref 39–117)
BUN: 13 mg/dL (ref 6–23)
Creat: 1.07 mg/dL (ref 0.50–1.10)
Total Bilirubin: 0.3 mg/dL (ref 0.3–1.2)

## 2013-10-02 LAB — IFOBT (OCCULT BLOOD): IFOBT: NEGATIVE

## 2013-10-02 MED ORDER — ZOSTER VACCINE LIVE 19400 UNT/0.65ML ~~LOC~~ SOLR: 0.6500 mL | Freq: Once | SUBCUTANEOUS | Status: DC

## 2013-10-02 MED ORDER — LISINOPRIL-HYDROCHLOROTHIAZIDE 10-12.5 MG PO TABS: 1.0000 | ORAL_TABLET | Freq: Every day | ORAL | Status: DC

## 2013-10-02 NOTE — Patient Instructions (Addendum)
1500 Calorie Diabetic Diet The 1500 calorie diabetic diet limits calories to 1500 each day. Following this diet and making healthy meal choices can help improve overall health. It controls blood glucose (sugar) levels and can also help lower blood pressure and cholesterol.  SERVING SIZES Measuring foods and serving sizes helps to make sure you are getting the right amount of food. The list below tells how big or small some common serving sizes are.  1 oz.........4 stacked dice.  3 oz.........Deck of cards.  1 tsp........Tip of little finger.  1 tbs........Thumb.  2 tbs........Golf ball.   cup.......Half of a fist.  1 cup........A fist. GUIDELINES FOR CHOOSING FOODS The goal of this diet is to eat a variety of foods and limit calories to 1500 each day. This can be done by choosing foods that are low in calories and fat. The diet also suggests eating small amounts of food frequently. Doing this helps control your blood glucose levels, so they do not get too high or too low. Each meal or snack may include a protein food source to help you feel more satisfied. Try to eat about the same amount of food around the same time each day. This includes weekend days, travel days, and days off work. Space your meals about 4 to 5 hours apart, and add a snack between them, if you wish.  For example, a daily food plan could include breakfast, a morning snack, lunch, dinner, and an evening snack. Healthy meals and snacks have different types of foods, including whole grains, vegetables, fruits, lean meats, poultry, fish, and dairy products. As you plan your meals, select a variety of foods. Choose from the bread and starch, vegetable, fruit, dairy, and meat/protein groups. Examples of foods from each group are listed below, with their suggested serving sizes. Use measuring cups and spoons to become familiar with what a healthy portion looks like. Bread and Starch Each serving equals 15 grams of  carbohydrate.  1 slice bread.   bagel.   cup cold cereal (unsweetened).   cup hot cereal or mashed potatoes.  1 small potato (size of a computer mouse).   cup cooked pasta or rice.   English muffin.  1 cup broth-based soup.  3 cups of popcorn.  4 to 6 whole-wheat crackers.   cup cooked beans, peas, or corn. Vegetables Each serving equals 5 grams of carbohydrate.   cup cooked vegetables.  1 cup raw vegetables.   cup tomato or vegetable juice. Fruit Each serving equals 15 grams of carbohydrate.  1 small apple or orange.  1  cup watermelon or strawberries.   cup applesauce (no sugar added).  2 tbs raisins.   banana.   cup canned fruit, packed in water or in its own juice.   cup unsweetened fruit juice. Dairy Each serving equals 12 to 15 grams of carbohydrate.  1 cup fat-free milk.  6 oz artificially sweetened yogurt or plain yogurt.  1 cup low-fat buttermilk.  1 cup soy milk.  1 cup almond milk. Meat/Protein  1 large egg.  2 to 3 oz meat, poultry, or fish.   cup low-fat cottage cheese.  1 tbs peanut butter.  1 oz low-fat cheese.   cup tuna, packed in water.   cup tofu. Fat  1 tsp oil.  1 tsp trans-fat-free margarine.  1 tsp butter.  1 tsp mayonnaise.  2 tbs avocado.  1 tbs salad dressing.  1 tbs cream cheese.  2 tbs sour cream. SAMPLE 1500 CALORIE DIET   PLAN Breakfast   whole-wheat English muffin (1 carb serving).  1 tsp trans-fat-free margarine.  1 scrambled egg.  1 cup fat-free milk (1 carb serving).  1 small orange (1 carb serving). Lunch  Chicken wrap.  1 whole-wheat tortilla, 8-inch (1 carb servings).  2 oz chicken breast, sliced.  2 tbs low-fat salad dressing, such as [place].   cup shredded lettuce.  2 slices tomato.   cup carrot sticks.  1 small apple (1 carb serving). Afternoon Snack  3 graham cracker squares (1 carb serving).  1 tbs peanut butter. Dinner  2 oz lean  pork chop, broiled.  1 cup brown rice (3 carb servings).   cup steamed carrots.   cup green beans.  1 cup fat-free milk (1 carb serving).  1 tsp trans-fat-free margarine. Evening Snack   cup low-fat cottage cheese.  1 small peach or pear, sliced (or  cup canned in water) (1 carb serving). MEAL PLAN You can use this worksheet to help you make a daily meal plan based on the 1500 calorie diabetic diet suggestions. If you are using this plan to help you control your blood glucose, you may interchange carbohydrate containing foods (dairy, starches, and fruits). Select a variety of fresh foods of varying colors and flavors. The total amount of carbohydrate in your meals or snacks is more important than making sure you include all of the food groups every time you eat. You can choose from approximately this many of the following foods to build your day's meals:  6 Starches.  3 Vegetables.  2 Fruits.  2 Dairy.  4 to 6 oz Meat/Protein.  Up to 3 Fats. Your dietician can use this worksheet to help you decide how many servings and which types of foods are right for you. BREAKFAST Food Group and Servings / Food Choice Starch _________________________________________________________ Dairy __________________________________________________________ Fruit ___________________________________________________________ Meat/Protein____________________________________________________ Fat ____________________________________________________________ LUNCH Food Group and Servings / Food Choice  Starch _________________________________________________________ Meat/Protein ___________________________________________________ Vegetables _____________________________________________________ Fruit __________________________________________________________ Dairy __________________________________________________________ Fat ____________________________________________________________ Eileen Jefferson Food Group and Servings / Food Choice Dairy __________________________________________________________ Starch _________________________________________________________ Meat/Protein____________________________________________________ Eileen Jefferson ___________________________________________________________ Eileen Jefferson Food Group and Servings / Food Choice Starch _________________________________________________________ Meat/Protein ___________________________________________________ Dairy __________________________________________________________ Vegetable ______________________________________________________ Fruit ___________________________________________________________ Fat ____________________________________________________________ Eileen Jefferson Food Group and Servings / Food Choice Fruit ___________________________________________________________ Meat/Protein ____________________________________________________ Dairy __________________________________________________________ Starch __________________________________________________________ DAILY TOTALS Starches _________________________ Vegetables _______________________ Fruits ____________________________ Dairy ____________________________ Meat/Protein_____________________ Fats _____________________________ Document Released: 06/19/2005 Document Revised: 02/19/2012 Document Reviewed: 10/14/2009 [employer] Patient Information 2014 [employer] DASH Diet The DASH diet stands for "Dietary Approaches to Stop Hypertension." It is a healthy eating plan that has been shown to reduce high blood pressure (hypertension) in as little as 14 days, while also possibly providing other significant health benefits. These other health benefits include reducing the risk of breast cancer after menopause and reducing the risk of type 2 diabetes, heart disease, colon cancer, and stroke. Health benefits also include weight loss and slowing kidney failure  in patients with chronic kidney disease.  DIET GUIDELINES  Limit salt (sodium). Your diet should contain less than 1500 mg of sodium daily.  Limit refined or processed carbohydrates. Your diet should include mostly whole grains. Desserts and added sugars should be used sparingly.  Include small amounts of heart-healthy fats. These types of fats include nuts, oils, and tub margarine. Limit saturated and trans fats. These fats have been shown to be harmful in the body. CHOOSING FOODS  The following food groups are based on a 2000 calorie diet. See your  Registered Dietitian for individual calorie needs. Grains and Grain Products (6 to 8 servings daily)  Eat More Often: Whole-wheat bread, brown rice, whole-grain or wheat pasta, quinoa, popcorn without added fat or salt (air popped).  Eat Less Often: White bread, white pasta, white rice, cornbread. Vegetables (4 to 5 servings daily)  Eat More Often: Fresh, frozen, and canned vegetables. Vegetables may be raw, steamed, roasted, or grilled with a minimal amount of fat.  Eat Less Often/Avoid: Creamed or fried vegetables. Vegetables in a cheese sauce. Fruit (4 to 5 servings daily)  Eat More Often: All fresh, canned (in natural juice), or frozen fruits. Dried fruits without added sugar. One hundred percent fruit juice ( cup [237 mL] daily).  Eat Less Often: Dried fruits with added sugar. Canned fruit in light or heavy syrup. [employer], Fish, and Poultry (2 servings or less daily. One serving is 3 to 4 oz [85-114 g]).  Eat More Often: Ninety percent or leaner ground beef, tenderloin, sirloin. Round cuts of beef, chicken breast, [place] breast. All fish. Grill, bake, or broil your meat. Nothing should be fried.  Eat Less Often/Avoid: Fatty cuts of meat, [place], or chicken leg, thigh, or wing. Fried cuts of meat or fish. Dairy (2 to 3 servings)  Eat More Often: Low-fat or fat-free milk, low-fat plain or light yogurt, reduced-fat or part-skim  cheese.  Eat Less Often/Avoid: Milk (whole, 2%).Whole milk yogurt. Full-fat cheeses. Nuts, Seeds, and Legumes (4 to 5 servings per week)  Eat More Often: All without added salt.  Eat Less Often/Avoid: Salted nuts and seeds, canned beans with added salt. Fats and Sweets (limited)  Eat More Often: Vegetable oils, tub margarines without trans fats, sugar-free gelatin. Mayonnaise and salad dressings.  Eat Less Often/Avoid: Coconut oils, palm oils, butter, stick margarine, cream, half and half, cookies, candy, pie. FOR MORE INFORMATION The Dash Diet Eating Plan: www.dashdiet.org Document Released: 11/16/2011 Document Revised: 02/19/2012 Document Reviewed: 11/16/2011 [facility] Patient Information 2014 [place], [place].

## 2013-10-02 NOTE — Progress Notes (Signed)
  Subjective:    Patient ID: Eileen Jefferson, female    DOB: 1951-11-16, 62 y.o.   MRN: [id number]  HPI    Review of Systems  Constitutional: Negative.   Eyes:       Last eye exam 05/2013  Respiratory: Negative.   Cardiovascular:       Hypertension  Gastrointestinal: Negative.   Endocrine:       Diabetes and thyroid abnormalities  Genitourinary: Negative.   Musculoskeletal: Negative.   Skin: Negative.   Allergic/Immunologic: Negative.   Neurological: Positive for headaches.  Hematological:       Anemia  Psychiatric/Behavioral: Negative.        Objective:   Physical Exam        Assessment & Plan:

## 2013-10-02 NOTE — Progress Notes (Signed)
Subjective:    Patient ID: Eileen Jefferson, female    DOB: [birth year], 62 y.o.   MRN: [id number]  HPI 62 year old female presents to clinic this morning for her CPE. She states that she needs refills for Lisinopril/HCTZ 10-12.5mg  QD and Lipitor 10mg  QD. Pt states she has not had Lipitor in over a year. Pt is following up with Dr. Mitzi Davenport (Opthamologist) in the morning. She had a normal mammogram done in 01/2013 and a colonoscopy done in 2012 which found Diverticulosis.  Patient states she has been feeling pretty good this past year. Patient does use her CPAP machine at night. She does state that she gets up to urinate 1-2 times at night and she is having night sweats. Patient does want to have flu shot today.  Pre diabetic by hx but has gained weight and drinks sweet tea.  Review of Systems  Constitutional: Negative.   Eyes:       Last eye exam 05/2013  Respiratory: Negative.   Cardiovascular:       Hypertension  Gastrointestinal: Negative.   Endocrine:       Diabetes and thyroid abnormalities  Genitourinary: Negative.   Musculoskeletal: Negative.   Skin: Negative.   Allergic/Immunologic: Negative.   Neurological: Positive for headaches.  Hematological:       Anemia  Psychiatric/Behavioral: Negative.        Objective:   Physical Exam  Vitals reviewed. Constitutional: She is oriented to person, place, and time. She appears well-nourished. No distress.  HENT:  Head: Normocephalic.  Right Ear: External ear normal.  Left Ear: External ear normal.  Nose: Nose normal.  Mouth/Throat: Oropharynx is clear and moist.  Eyes: Conjunctivae and EOM are normal. Pupils are equal, round, and reactive to light.  Neck: Normal range of motion. Neck supple.  Cardiovascular: Normal rate, regular rhythm, normal heart sounds and intact distal pulses.   Pulmonary/Chest: Effort normal and breath sounds normal.  Abdominal: Soft. Bowel sounds are normal. There is no tenderness. Hernia confirmed  negative in the right inguinal area and confirmed negative in the left inguinal area.  Genitourinary: Uterus normal. No breast swelling, tenderness, discharge or bleeding. Pelvic exam was performed with patient in the knee-chest position. There is no rash, tenderness or lesion on the right labia. There is no rash, tenderness, lesion or injury on the left labia. There is erythema around the vagina. No tenderness or bleeding around the vagina. No foreign body around the vagina. No signs of injury around the vagina. Vaginal discharge found.  Hx TAH  Musculoskeletal: Normal range of motion.  Lymphadenopathy:       Right: No inguinal adenopathy present.       Left: No inguinal adenopathy present.  Neurological: She is alert and oriented to person, place, and time. She has normal reflexes. No cranial nerve deficit or sensory deficit. She exhibits normal muscle tone. Coordination and gait normal.  Skin: Skin is warm and dry. No rash noted.  Psychiatric: She has a normal mood and affect. Her behavior is normal. Judgment and thought content normal.   EKG stable unchanged from 2011 Results for orders placed in visit on 10/02/13  POCT CBC      Result Value Range   WBC 7.4  4.6 - 10.2 K/uL   Lymph, poc 2.2  0.6 - 3.4   POC LYMPH PERCENT 29.8  10 - 50 %L   MID (cbc) 0.4  0 - 0.9   POC MID % 4.9  0 - 12 %  M   POC Granulocyte 4.8  2 - 6.9   Granulocyte percent 65.3  37 - 80 %G   RBC 5.18  4.04 - 5.48 M/uL   Hemoglobin 11.0 (*) 12.2 - 16.2 g/dL   HCT, POC 16.1  09.6 - 47.9 %   MCV 73.2 (*) 80 - 97 fL   MCH, POC 21.2 (*) 27 - 31.2 pg   MCHC 29.0 (*) 31.8 - 35.4 g/dL   RDW, POC 04.5     Platelet Count, POC 324  142 - 424 K/uL   MPV 9.5  0 - 99.8 fL  GLUCOSE, POCT (MANUAL RESULT ENTRY)      Result Value Range   POC Glucose 87  70 - 99 mg/dl  POCT GLYCOSYLATED HEMOGLOBIN (HGB A1C)      Result Value Range   Hemoglobin A1C 6.3    POCT UA - MICROSCOPIC ONLY      Result Value Range   WBC, Ur, HPF, POC  2-5     RBC, urine, microscopic neg     Bacteria, U Microscopic neg     Mucus, UA neg     Epithelial cells, urine per micros 0-4     Crystals, Ur, HPF, POC neg     Casts, Ur, LPF, POC neg     Yeast, UA neg    POCT URINALYSIS DIPSTICK      Result Value Range   Color, UA yellow     Clarity, UA clear     Glucose, UA neg     Bilirubin, UA neg     Ketones, UA nneg     Spec Grav, UA 1.015     Blood, UA neg     pH, UA 5.5     Protein, UA neg     Urobilinogen, UA 0.2     Nitrite, UA neg     Leukocytes, UA Negative    POCT WET PREP WITH KOH      Result Value Range   Trichomonas, UA Negative     Clue Cells Wet Prep HPF POC neg     Epithelial Wet Prep HPF POC 6-15     Yeast Wet Prep HPF POC neg     Bacteria Wet Prep HPF POC 1+     RBC Wet Prep HPF POC 20-25     WBC Wet Prep HPF POC 0-3     KOH Prep POC Negative    IFOBT (OCCULT BLOOD)      Result Value Range   IFOBT Negative            Assessment & Plan:  Flu/Pneumia/Shingles vacs RF lisinopril

## 2013-10-03 LAB — PAP IG W/ RFLX HPV ASCU

## 2013-10-05 ENCOUNTER — Encounter: Payer: Self-pay | Admitting: Family Medicine

## 2013-10-09 ENCOUNTER — Telehealth: Payer: Self-pay | Admitting: Pulmonary Disease

## 2013-10-09 NOTE — Telephone Encounter (Signed)
To:  Dr. Coralyn Helling  From:  Ritta Slot, [place]  Re:  [name]                             DOB:  [birth year]  At [facility] we are dedicated to providing our patients and referral sources with excellent service and continuous follow-up.    Please note as of today, October 09, 2013, we have not been able to contact your patient.  We have called [name] several times with no response.  We mailed a letter to [name] today requesting a download.  Without this data, we cannot help the patient with her compliance or communication with Physicians.  We would like to assist [name], and I will continue to try and get in touch with her.  If you have any questions, please contact me at [phone].  Thank you,     (Signature)  Ritta Slot, [place]

## 2013-10-09 NOTE — Telephone Encounter (Signed)
LMOM x 1 for pt to return call [name] only working number on chart is [phone]

## 2013-10-09 NOTE — Telephone Encounter (Signed)
Spoke with Clydie Braun with [facility]-- Clydie Braun states that patient has limited use recorded on CPAP modem. Clydie Braun states that she is not sure if modem is working or not or if she is even using CPAP as she says she is.   Clydie Braun has tried multiple times to get in touch with pt with patient and left multiple messages with no response. Clydie Braun is wanting to send pt a SD card for her machine to place in machine so that they can get a recent D/L of compliance.   [facility] has a number on file that we do not have in the patients chart--[phone]. Clydie Braun unsure if this is a direct contact for the patient or someone else's number that the patient knows.  I have called and left a messages for the patient to contact our office on [phone] and [phone]. Pt needs to contact Clydie Braun with [facility]

## 2013-10-10 NOTE — Telephone Encounter (Signed)
I spoke with the pt and advised that [facility] has been trying to get in contact with her but has been unable to do so. I provided her with the # and Karens name. Pt states she will call now. Carron Curie, CMA

## 2013-10-15 ENCOUNTER — Other Ambulatory Visit: Payer: Self-pay | Admitting: Internal Medicine

## 2013-10-28 ENCOUNTER — Encounter: Payer: Self-pay | Admitting: Pulmonary Disease

## 2014-03-04 ENCOUNTER — Telehealth: Payer: Self-pay | Admitting: Radiology

## 2014-04-06 ENCOUNTER — Ambulatory Visit (INDEPENDENT_AMBULATORY_CARE_PROVIDER_SITE_OTHER): Payer: BC Managed Care – PPO | Admitting: Family Medicine

## 2014-04-06 VITALS — BP 140/77 | HR 90 | Temp 98.2°F | Resp 16 | Ht 62.0 in | Wt 231.0 lb

## 2014-04-06 DIAGNOSIS — D8683 Sarcoid iridocyclitis: Secondary | ICD-10-CM

## 2014-04-06 DIAGNOSIS — R7303 Prediabetes: Secondary | ICD-10-CM

## 2014-04-06 DIAGNOSIS — H409 Unspecified glaucoma: Secondary | ICD-10-CM

## 2014-04-06 DIAGNOSIS — I10 Essential (primary) hypertension: Secondary | ICD-10-CM

## 2014-04-06 DIAGNOSIS — R7309 Other abnormal glucose: Secondary | ICD-10-CM

## 2014-04-06 DIAGNOSIS — E785 Hyperlipidemia, unspecified: Secondary | ICD-10-CM

## 2014-04-06 DIAGNOSIS — H3530 Unspecified macular degeneration: Secondary | ICD-10-CM | POA: Insufficient documentation

## 2014-04-06 DIAGNOSIS — E119 Type 2 diabetes mellitus without complications: Secondary | ICD-10-CM

## 2014-04-06 HISTORY — DX: Sarcoid iridocyclitis: D86.83

## 2014-04-06 HISTORY — DX: Unspecified glaucoma: H40.9

## 2014-04-06 LAB — BASIC METABOLIC PANEL
BUN: 19 mg/dL (ref 6–23)
CO2: 29 mEq/L (ref 19–32)
Calcium: 9.4 mg/dL (ref 8.4–10.5)
Chloride: 102 mEq/L (ref 96–112)
Creat: 1 mg/dL (ref 0.50–1.10)
GLUCOSE: 89 mg/dL (ref 70–99)
POTASSIUM: 4.2 meq/L (ref 3.5–5.3)
SODIUM: 140 meq/L (ref 135–145)

## 2014-04-06 LAB — LIPID PANEL
CHOL/HDL RATIO: 5.5 ratio
CHOLESTEROL: 252 mg/dL — AB (ref 0–200)
HDL: 46 mg/dL (ref >39)
LDL Cholesterol: 185 mg/dL — ABNORMAL HIGH (ref 0–99)
TRIGLYCERIDES: 107 mg/dL (ref <150)
VLDL: 21 mg/dL (ref 0–40)

## 2014-04-06 LAB — HEMOGLOBIN A1C
Hgb A1c MFr Bld: 7 % — ABNORMAL HIGH (ref <5.7)
MEAN PLASMA GLUCOSE: 154 mg/dL — AB (ref <117)

## 2014-04-06 NOTE — Patient Instructions (Signed)
I will be in touch with your labs and will find out if you had your tetanus shot.  Please come and see me in about 6 months

## 2014-04-06 NOTE — Progress Notes (Addendum)
[facility] [address], [place] [id number] [phone]  Date:  04/06/2014   Name:  Eileen FarberVictoria M Jefferson   DOB:  08-19-1951   MRN:  [id number]  PCP:  Tally DueGUEST, CHRIS WARREN, MD    Chief Complaint: Follow-up   History of Present Illness:  Eileen FarberVictoria M Jefferson is a 63 y.o. very pleasant female patient who presents with the following:  Here today for a follow-up visit.  She needs a BP check today.  She will occasionally check her BP at home- may get 130/74- 89, sometimes up to 140/90 at the highest.    She also has glaucoma and macular degeneration- her optho is Dr. Mitzi Daven[place], and Dr. Luciana Axeankin for her retinas.  She uses Cpap for her OSA.  History of borderline DM.    She is fasting today.   She has sarcoid mostly in her eyes.  She has just mild lung sx.  Otherwise she is feeling well today   Patient Active Problem List   Diagnosis Date Noted  . HTN (hypertension) 10/02/2013  . Sarcoidosis 10/02/2013  . Glucose intolerance (impaired glucose tolerance) 10/02/2013  . OSA (obstructive sleep apnea) 10/23/2012    Past Medical History  Diagnosis Date  . Pulmonary sarcoidosis   . Diverticulosis   . Iron deficiency anemia   . Hyperlipidemia   . HTN (hypertension)   . Sleep apnea   . Thyroid disease   . Diabetes mellitus without complication     per patient health survery-borderline    Past Surgical History  Procedure Laterality Date  . C-section      x 2  . Appendectomy    . Tonsillectomy    . Total abdominal hysterectomy    . Tubal ligation    . Cataract extraction      bilateral  . Vitrectomy      bilateral  . Scleral buckle      right    History  Substance Use Topics  . Smoking status: Former Smoker -- 0.20 packs/day for 1 years    Types: Cigarettes    Quit date: 12/11/1968  . Smokeless tobacco: Never Used  . Alcohol Use: No    Family History  Problem Relation Age of Onset  . Colon polyps Mother     brothers x2  . Colon cancer Neg Hx    . Heart disease Brother     mother, MGM  . Diabetes Brother     x 2, MGM  . Clotting disorder Mother     brother x 2, MGM  . Stroke Maternal Grandmother     Allergies  Allergen Reactions  . Contrast Media [Iodinated Diagnostic Agents]   . Iohexol Other (See Comments)    " made me feel like I was burning inside"    Medication list has been reviewed and updated.  Current Outpatient Prescriptions on File Prior to Visit  Medication Sig Dispense Refill  . atorvastatin (LIPITOR) 10 MG tablet TAKE 1 TABLET BY MOUTH DAILY  30 tablet  5  . brimonidine-timolol (COMBIGAN) 0.2-0.5 % ophthalmic solution Place 1 drop into the left eye 2 (two) times daily.        . brinzolamide (AZOPT) 1 % ophthalmic suspension Place 1 drop into the left eye 2 (two) times daily.        . ferrous sulfate 325 (65 FE) MG tablet Take 325 mg by mouth daily as needed.       Marland Kitchen. lisinopril-hydrochlorothiazide (PRINZIDE,ZESTORETIC) 10-12.5 MG per tablet  Take 1 tablet by mouth daily.  90 tablet  3  . prednisoLONE acetate (PRED FORTE) 1 % ophthalmic suspension Place 1 drop into the right eye daily.        Marland Kitchen. zoster vaccine live, PF, (ZOSTAVAX) [id number] UNT/0.65ML injection Inject 19,400 Units into the skin once.  1 each  0   No current facility-administered medications on file prior to visit.    Review of Systems:  As per HPI- otherwise negative.   Physical Examination: Filed Vitals:   04/06/14 0959  BP: 140/77  Pulse: 90  Temp: 98.2 F (36.8 C)  Resp: 16   Filed Vitals:   04/06/14 0959  Height: 5\' 2"  (1.575 m)  Weight: 231 lb (104.781 kg)   Body mass index is 42.24 kg/(m^2). Ideal Body Weight: Weight in (lb) to have BMI = 25: 136.4  GEN: WDWN, NAD, Non-toxic, A & O x 3, obese, looks well HEENT: Atraumatic, Normocephalic. Neck supple. No masses, No LAD.  S/p eye operations visible with irregular pupil right Ears and Nose: No external deformity. CV: RRR, No M/G/R. No JVD. No thrill. No extra heart  sounds. PULM: CTA B, no wheezes, crackles, rhonchi. No retractions. No resp. distress. No accessory muscle use. EXTR: No c/c/e NEURO Normal gait.  PSYCH: Normally interactive. Conversant. Not depressed or anxious appearing.  Calm demeanor.    Assessment and Plan: HTN (hypertension) - Plan: Basic metabolic panel  Hyperlipidemia - Plan: Lipid panel  Borderline diabetes - Plan: POCT glycosylated hemoglobin (Hb A1C)  Remind with labs- she does need a Tdap.  Looks like she had a td in 2003, nothing since per chart  BP is acceptable today Follow-up with labs when available   Signed Abbe AmsterdamJessica Copland, MD  Called to go over her labs on 4/30.  Cholesterol is too high- she admits to not taking her lipitor very consistently.  She will work on this. Also, she has now developed DM.  Will start metformin 500 qhs and go up to BID after a couple of weeks assuming GI SE are ok.  Plan to follow-up in 3 months  Results for orders placed in visit on 04/06/14  BASIC METABOLIC PANEL      Result Value Ref Range   Sodium 140  135 - 145 mEq/L   Potassium 4.2  3.5 - 5.3 mEq/L   Chloride 102  96 - 112 mEq/L   CO2 29  19 - 32 mEq/L   Glucose, Bld 89  70 - 99 mg/dL   BUN 19  6 - 23 mg/dL   Creat 9.811.00  1.910.50 - 4.781.10 mg/dL   Calcium 9.4  8.4 - 29.510.5 mg/dL  LIPID PANEL      Result Value Ref Range   Cholesterol 252 (*) 0 - 200 mg/dL   Triglycerides 621107  <308<150 mg/dL   HDL 46  >65>39 mg/dL   Total CHOL/HDL Ratio 5.5     VLDL 21  0 - 40 mg/dL   LDL Cholesterol 784185 (*) 0 - 99 mg/dL  HEMOGLOBIN [id number]      Result Value Ref Range   Hemoglobin A1C 7.0 (*) <5.7 %   Mean Plasma Glucose 154 (*) <117 mg/dL   Her

## 2014-04-09 ENCOUNTER — Encounter: Payer: Self-pay | Admitting: Family Medicine

## 2014-04-09 MED ORDER — METFORMIN HCL 500 MG PO TABS: 500.0000 mg | ORAL_TABLET | Freq: Two times a day (BID) | ORAL | Status: DC

## 2014-04-09 NOTE — Addendum Note (Signed)
Addended by: Abbe AmsterdamOPLAND, JESSICA C on: 04/09/2014 11:30 AM   Modules accepted: Orders

## 2014-04-13 ENCOUNTER — Telehealth: Payer: Self-pay

## 2014-04-13 DIAGNOSIS — E119 Type 2 diabetes mellitus without complications: Secondary | ICD-10-CM

## 2014-04-13 MED ORDER — GLUCOSE BLOOD VI STRP: ORAL_STRIP | Status: DC

## 2014-04-13 MED ORDER — BLOOD GLUCOSE METER KIT: PACK | Status: DC

## 2014-04-13 NOTE — Telephone Encounter (Signed)
Sent in rx for meter and strips.  LMOM; she may check her sugar a few times a week, and check if she is not feeling well

## 2014-04-13 NOTE — Telephone Encounter (Signed)
PT STATES SHE WAS PUT ON MEDICATION FOR DIABETICS AND WANTED TO KNOW IF SHE COULD GET AN ACCU-CHEK CALLED IN FOR HER SO SHE CAN TEST HER SUGAR PLEASE CALL [phone]    [facility] [address]

## 2014-04-15 ENCOUNTER — Ambulatory Visit: Payer: BC Managed Care – PPO | Admitting: Pulmonary Disease

## 2014-04-30 ENCOUNTER — Ambulatory Visit (INDEPENDENT_AMBULATORY_CARE_PROVIDER_SITE_OTHER): Payer: BC Managed Care – PPO | Admitting: Pulmonary Disease

## 2014-04-30 ENCOUNTER — Encounter: Payer: Self-pay | Admitting: Pulmonary Disease

## 2014-04-30 VITALS — BP 132/78 | HR 77 | Temp 98.0°F | Ht 62.5 in | Wt 235.0 lb

## 2014-04-30 DIAGNOSIS — G4733 Obstructive sleep apnea (adult) (pediatric): Secondary | ICD-10-CM

## 2014-04-30 NOTE — Patient Instructions (Signed)
Follow up in 1 year.

## 2014-04-30 NOTE — Progress Notes (Signed)
Chief Complaint  Patient presents with  . Follow-up    Wears CPAP x 6hr per night. States that she has not been using x 3-4 weeks d/t traveling a lot. Pt denies problems with mask or pressure setting.    History of Present Illness: Eileen Jefferson is a 63 y.o. female with OSA.  She uses cpap when she is at home.  She doesn't take her machine when she travels.    She has nasal pillows.  She goes to bed at 11 pm.  She gets up at 330 with her husband to go to work, and then goes back to sleep.  She gets out of bed at 8 am.  She feels rested.   TESTS: PSG 10/11/04>>AHI 16.7, SpO2 low 85% PSG 10/31/12>>AHI 12.9, REM 44.8, SpO2 low 59%, PLMI 0. Auto CPAP 12/11/12 to 02/04/13 >> used on 37  of 59 nights with average 5 hrs 3 min. Average AHI 0.7 with median CPAP 10 cm H2O and 95th percentile CPAP 14 cm H2O.  Eileen Jefferson  has a past medical history of Pulmonary sarcoidosis; Diverticulosis; Iron deficiency anemia; Hyperlipidemia; HTN (hypertension); Sleep apnea; Thyroid disease; and Diabetes mellitus without complication.  Eileen Jefferson  has past surgical history that includes c-section; Appendectomy; Tonsillectomy; Total abdominal hysterectomy; Tubal ligation; Cataract extraction; Vitrectomy; and Scleral buckle.  Prior to Admission medications   Medication Sig Start Date End Date Taking? Authorizing Provider  brimonidine-timolol (COMBIGAN) 0.2-0.5 % ophthalmic solution Place 1 drop into the left eye 2 (two) times daily.      Historical Provider, MD  brinzolamide (AZOPT) 1 % ophthalmic suspension Place 1 drop into the left eye 2 (two) times daily.      Historical Provider, MD  ferrous sulfate 325 (65 FE) MG tablet Take 325 mg by mouth daily.      Historical Provider, MD  lisinopril-hydrochlorothiazide (PRINZIDE,ZESTORETIC) 10-12.5 MG per tablet TAKE 1 TABLET BY MOUTH DIALY 04/29/12   Pattricia BossAlicia G Warrick, PA-C  prednisoLONE acetate (PRED FORTE) 1 % ophthalmic suspension Place 1 drop into the  right eye daily.      Historical Provider, MD    Allergies  Allergen Reactions  . Contrast Media [Iodinated Diagnostic Agents]   . Iohexol Other (See Comments)    " made me feel like I was burning inside"     Physical Exam:  General - No distress ENT - No sinus tenderness, no oral exudate, no LAN Cardiac - s1s2 regular, no murmur Chest - No wheeze/rales/dullness Back - No focal tenderness Abd - Soft, non-tender Ext - No edema Neuro - Normal strength Skin - No rashes Psych - normal mood, and behavior   Assessment/Plan:  Coralyn HellingVineet Karia Ehresman, MD [facility] Pager:  [phone] 04/30/2014, 4:38 PM

## 2014-04-30 NOTE — Assessment & Plan Note (Signed)
She is compliant with CPAP and reports benefit from therapy.  Advised her to try using CPAP more when she travels.

## 2014-06-25 ENCOUNTER — Other Ambulatory Visit: Payer: Self-pay | Admitting: Family Medicine

## 2014-06-26 MED ORDER — GLUCOSE BLOOD VI STRP: ORAL_STRIP | Status: DC

## 2014-07-02 ENCOUNTER — Other Ambulatory Visit: Payer: Self-pay

## 2014-07-02 MED ORDER — LANCETS MISC: Status: DC

## 2014-10-06 ENCOUNTER — Other Ambulatory Visit: Payer: Self-pay | Admitting: Internal Medicine

## 2014-10-10 ENCOUNTER — Other Ambulatory Visit: Payer: Self-pay | Admitting: Internal Medicine

## 2014-10-11 NOTE — Telephone Encounter (Signed)
Called in.

## 2014-10-12 ENCOUNTER — Ambulatory Visit: Payer: Self-pay | Admitting: Family Medicine

## 2014-11-11 ENCOUNTER — Other Ambulatory Visit: Payer: Self-pay | Admitting: Internal Medicine

## 2015-01-11 ENCOUNTER — Other Ambulatory Visit: Payer: Self-pay | Admitting: Family Medicine

## 2015-01-11 ENCOUNTER — Ambulatory Visit (INDEPENDENT_AMBULATORY_CARE_PROVIDER_SITE_OTHER): Payer: BLUE CROSS/BLUE SHIELD | Admitting: Family Medicine

## 2015-01-11 VITALS — BP 134/74 | HR 87 | Temp 98.1°F | Resp 18 | Ht 63.0 in | Wt 218.2 lb

## 2015-01-11 DIAGNOSIS — E785 Hyperlipidemia, unspecified: Secondary | ICD-10-CM | POA: Diagnosis not present

## 2015-01-11 DIAGNOSIS — Z23 Encounter for immunization: Secondary | ICD-10-CM | POA: Diagnosis not present

## 2015-01-11 DIAGNOSIS — I10 Essential (primary) hypertension: Secondary | ICD-10-CM | POA: Diagnosis not present

## 2015-01-11 DIAGNOSIS — D509 Iron deficiency anemia, unspecified: Secondary | ICD-10-CM | POA: Diagnosis not present

## 2015-01-11 DIAGNOSIS — E119 Type 2 diabetes mellitus without complications: Secondary | ICD-10-CM | POA: Diagnosis not present

## 2015-01-11 DIAGNOSIS — E669 Obesity, unspecified: Secondary | ICD-10-CM | POA: Diagnosis not present

## 2015-01-11 LAB — COMPREHENSIVE METABOLIC PANEL
ALBUMIN: 4.2 g/dL (ref 3.5–5.2)
ALT: 12 U/L (ref 0–35)
AST: 15 U/L (ref 0–37)
Alkaline Phosphatase: 98 U/L (ref 39–117)
BILIRUBIN TOTAL: 0.3 mg/dL (ref 0.2–1.2)
BUN: 22 mg/dL (ref 6–23)
CO2: 29 mEq/L (ref 19–32)
CREATININE: 1.06 mg/dL (ref 0.50–1.10)
Calcium: 9.5 mg/dL (ref 8.4–10.5)
Chloride: 105 mEq/L (ref 96–112)
Glucose, Bld: 67 mg/dL — ABNORMAL LOW (ref 70–99)
POTASSIUM: 3.9 meq/L (ref 3.5–5.3)
SODIUM: 142 meq/L (ref 135–145)
Total Protein: 7.8 g/dL (ref 6.0–8.3)

## 2015-01-11 LAB — CBC
HEMATOCRIT: 36.2 % (ref 36.0–46.0)
HEMOGLOBIN: 11.2 g/dL — AB (ref 12.0–15.0)
MCH: 21.5 pg — ABNORMAL LOW (ref 26.0–34.0)
MCHC: 30.9 g/dL (ref 30.0–36.0)
MCV: 69.5 fL — ABNORMAL LOW (ref 78.0–100.0)
MPV: 9.7 fL (ref 8.6–12.4)
Platelets: 339 10*3/uL (ref 150–400)
RBC: 5.21 MIL/uL — AB (ref 3.87–5.11)
RDW: 17.3 % — AB (ref 11.5–15.5)
WBC: 8.5 10*3/uL (ref 4.0–10.5)

## 2015-01-11 LAB — HEMOGLOBIN A1C
Hgb A1c MFr Bld: 6.3 % — ABNORMAL HIGH (ref <5.7)
Mean Plasma Glucose: 134 mg/dL — ABNORMAL HIGH (ref <117)

## 2015-01-11 LAB — LIPID PANEL
CHOLESTEROL: 238 mg/dL — AB (ref 0–200)
HDL: 48 mg/dL (ref >39)
LDL Cholesterol: 172 mg/dL — ABNORMAL HIGH (ref 0–99)
TRIGLYCERIDES: 92 mg/dL (ref <150)
Total CHOL/HDL Ratio: 5 Ratio
VLDL: 18 mg/dL (ref 0–40)

## 2015-01-11 MED ORDER — ATORVASTATIN CALCIUM 10 MG PO TABS
10.0000 mg | ORAL_TABLET | Freq: Every day | ORAL | Status: DC
Start: 2015-01-11 — End: 2015-01-12

## 2015-01-11 MED ORDER — LISINOPRIL-HYDROCHLOROTHIAZIDE 10-12.5 MG PO TABS: 1.0000 | ORAL_TABLET | Freq: Every day | ORAL | Status: DC

## 2015-01-11 MED ORDER — METFORMIN HCL 500 MG PO TABS: 500.0000 mg | ORAL_TABLET | Freq: Two times a day (BID) | ORAL | Status: DC

## 2015-01-11 NOTE — Progress Notes (Addendum)
[facility] [address], [place] [address] [phone]  Date:  01/11/2015   Name:  Eileen Jefferson   DOB:  January 13, 1951   MRN:  [id number]  PCP:  Kennon Portela, MD    Chief Complaint: Medication Refill   History of Present Illness:  Eileen Jefferson is a 64 y.o. very pleasant female patient who presents with the following:  Here today for a diabetes  recheck and medication refill.  She missed her appt so she came in to see me today.  Her mother died last year which was hard, although she was 65 years old.  Lab Results  Component Value Date   HGBA1C 7.0* 04/06/2014   She is otherwise doing well.  She last ate cereal at 8 pm- 7 hours ago BP Readings from Last 3 Encounters:  01/11/15 134/74  04/30/14 132/78  04/06/14 140/77   She will do a flu shot today, also due for a tetanus shot.  She has not had a Tdap yet.   She follows up regularly with her eye doctor for history of glaucoma and macular degeneratiion.   Her vision is stable.    Patient Active Problem List   Diagnosis Date Noted  . Glaucoma 04/06/2014  . Macular degeneration 04/06/2014  . 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 HTN (hypertension) 10/02/2013  . Sarcoidosis 10/02/2013  . Diabetes mellitus, type 2 10/02/2013  . OSA (obstructive sleep apnea) 10/23/2012    Past Medical History  Diagnosis Date  . Pulmonary sarcoidosis   . Diverticulosis   . Iron deficiency anemia   . Hyperlipidemia   . HTN (hypertension)   . Sleep apnea   . Thyroid disease   . Diabetes mellitus without complication     per patient health survery-borderline    Past Surgical History  Procedure Laterality Date  . C-section      x 2  . Appendectomy    . Tonsillectomy    . Total abdominal hysterectomy    . Tubal ligation    . Cataract extraction      bilateral  . Vitrectomy      bilateral  . Scleral buckle      right    History  Substance Use Topics  . Smoking status:  Former Smoker -- 0.20 packs/day for 1 years    Types: Cigarettes    Quit date: 12/11/1968  . Smokeless tobacco: Never Used  . Alcohol Use: No    Family History  Problem Relation Age of Onset  . Colon polyps Mother     brothers x2  . Colon cancer Neg Hx   . Heart disease Brother     mother, MGM  . Diabetes Brother     x 2, MGM  . Clotting disorder Mother     brother x 2, MGM  . Stroke Maternal Grandmother     Allergies  Allergen Reactions  . Contrast Media [Iodinated Diagnostic Agents]   . Iohexol Other (See Comments)    " made me feel like I was burning inside"    Medication list has been reviewed and updated.  Current Outpatient Prescriptions on File Prior to Visit  Medication Sig Dispense Refill  . atorvastatin (LIPITOR) 10 MG tablet Take 1 tablet (10 mg total) by mouth daily. PATIENT NEEDS OFFICE VISIT FOR ADDITIONAL REFILLS 30 tablet 0  . Blood Glucose Monitoring Suppl (CONTOUR NEXT EZ MONITOR) W/DEVICE KIT USE TO TEST GLUCOSE ONCE A DAY OR  AS NEEDED 100 kit 11  . brimonidine-timolol (COMBIGAN) 0.2-0.5 % ophthalmic solution Place 1 drop into the left eye 2 (two) times daily.      . brinzolamide (AZOPT) 1 % ophthalmic suspension Place 1 drop into the left eye 2 (two) times daily.      Marland Kitchen glucose blood (ONE TOUCH ULTRA TEST) test strip Use as instructed 100 each 12  . Lancets MISC Use to test blood sugar once daily or as needed. 100 each 3  . lisinopril-hydrochlorothiazide (PRINZIDE,ZESTORETIC) 10-12.5 MG per tablet TAKE 1 TABLET BY MOUTH DAILY. 30 tablet 0  . lisinopril-hydrochlorothiazide (PRINZIDE,ZESTORETIC) 10-12.5 MG per tablet TAKE 1 TABLET BY MOUTH DAILY. 30 tablet 0  . metFORMIN (GLUCOPHAGE) 500 MG tablet Take 1 tablet (500 mg total) by mouth 2 (two) times daily with a meal. Start with one at bedtime for one week 180 tablet 3  . prednisoLONE acetate (PRED FORTE) 1 % ophthalmic suspension Place 1 drop into the right eye daily.       No current facility-administered  medications on file prior to visit.    Review of Systems:  As per HPI- otherwise negative.   Physical Examination: Filed Vitals:   01/11/15 1432  BP: 134/74  Pulse: 87  Temp: 98.1 F (36.7 C)  Resp: 18   Filed Vitals:   01/11/15 1432  Height: _0  (1.6 m)  Weight: 218 lb 3.2 oz (98.975 kg)   Body mass index is 38.66 kg/(m^2). Ideal Body Weight: Weight in (lb) to have BMI = 25: 140.8  GEN: WDWN, NAD, Non-toxic, A & O x 3, obese, looks well HEENT: Atraumatic, Normocephalic. Neck supple. No masses, No LAD.  Bilateral TM wnl, oropharynx normal.  PEERL,EOMI.   Ears and Nose: No external deformity. CV: RRR, No M/G/R. No JVD. No thrill. No extra heart sounds. PULM: CTA B, no wheezes, crackles, rhonchi. No retractions. No resp. distress. No accessory muscle use. EXTR: No c/c/e NEURO Normal gait.  PSYCH: Normally interactive. Conversant. Not depressed or anxious appearing.  Calm demeanor.  Normal bilateral foot exam  Assessment and Plan: Diabetes mellitus type 2, controlled - Plan: metFORMIN (GLUCOPHAGE) 500 MG tablet, Hemoglobin A1c, Microalbumin, urine  Hyperlipidemia - Plan: atorvastatin (LIPITOR) 10 MG tablet, Lipid panel  Essential hypertension - Plan: lisinopril-hydrochlorothiazide (PRINZIDE,ZESTORETIC) 10-12.5 MG per tablet, CBC, Comprehensive metabolic panel  Immunization due - Plan: Flu Vaccine QUAD 36+ mos IM, Tdap vaccine greater than or equal to 7yo IM  Refills and labs today. tdap and flu shot today Follow-up pending labs  Signed Lamar Blinks, MD  2/2- labs in, gave her a call.  A1c looks ok.  She is anemic- better than usual but suspect low iron.  I will ask lab to add a ferritin. Also her cholesterol needs improvement- we will increase her lipitor to 20 mg  Results for orders placed or performed in visit on 01/11/15  CBC  Result Value Ref Range   WBC 8.5 4.0 - 10.5 K/uL   RBC 5.21 (H) 3.87 - 5.11 MIL/uL   Hemoglobin 11.2 (L) 12.0 - 15.0 g/dL   HCT  36.2 36.0 - 46.0 %   MCV 69.5 (L) 78.0 - 100.0 fL   MCH 21.5 (L) 26.0 - 34.0 pg   MCHC 30.9 30.0 - 36.0 g/dL   RDW 17.3 (H) 11.5 - 15.5 %   Platelets 339 150 - 400 K/uL   MPV 9.7 8.6 - 12.4 fL  Comprehensive metabolic panel  Result Value Ref Range   Sodium 142  135 - 145 mEq/L   Potassium 3.9 3.5 - 5.3 mEq/L   Chloride 105 96 - 112 mEq/L   CO2 29 19 - 32 mEq/L   Glucose, Bld 67 (L) 70 - 99 mg/dL   BUN 22 6 - 23 mg/dL   Creat 1.06 0.50 - 1.10 mg/dL   Total Bilirubin 0.3 0.2 - 1.2 mg/dL   Alkaline Phosphatase 98 39 - 117 U/L   AST 15 0 - 37 U/L   ALT 12 0 - 35 U/L   Total Protein 7.8 6.0 - 8.3 g/dL   Albumin 4.2 3.5 - 5.2 g/dL   Calcium 9.5 8.4 - 10.5 mg/dL  Lipid panel  Result Value Ref Range   Cholesterol 238 (H) 0 - 200 mg/dL   Triglycerides 92 <150 mg/dL   HDL 48 >39 mg/dL   Total CHOL/HDL Ratio 5.0 Ratio   VLDL 18 0 - 40 mg/dL   LDL Cholesterol 172 (H) 0 - 99 mg/dL  Hemoglobin A1c  Result Value Ref Range   Hgb A1c MFr Bld 6.3 (H) <5.7 %   Mean Plasma Glucose 134 (H) <117 mg/dL  Microalbumin, urine  Result Value Ref Range   Microalb, Ur 0.6 <2.0 mg/dL

## 2015-01-11 NOTE — Patient Instructions (Signed)
Let's recheck in 3-6 months depending on your lab results Take care and let me know if you need anything I am sorry to hear about the loss of your mother

## 2015-01-12 ENCOUNTER — Encounter: Payer: Self-pay | Admitting: Family Medicine

## 2015-01-12 LAB — MICROALBUMIN, URINE: Microalb, Ur: 0.6 mg/dL (ref <2.0)

## 2015-01-12 MED ORDER — ATORVASTATIN CALCIUM 20 MG PO TABS: 10.0000 mg | ORAL_TABLET | Freq: Every day | ORAL | Status: DC

## 2015-01-12 NOTE — Addendum Note (Signed)
Addended by: Abbe AmsterdamOPLAND, JESSICA C on: 01/12/2015 12:54 PM   Modules accepted: Orders

## 2015-01-14 ENCOUNTER — Encounter: Payer: Self-pay | Admitting: Family Medicine

## 2015-01-14 LAB — FERRITIN: Ferritin: 77 ng/mL (ref 10–291)

## 2015-05-05 ENCOUNTER — Encounter: Payer: Self-pay | Admitting: Family Medicine

## 2015-05-05 DIAGNOSIS — E11319 Type 2 diabetes mellitus with unspecified diabetic retinopathy without macular edema: Secondary | ICD-10-CM | POA: Insufficient documentation

## 2015-05-13 ENCOUNTER — Encounter: Payer: Self-pay | Admitting: *Deleted

## 2015-07-11 ENCOUNTER — Other Ambulatory Visit: Payer: Self-pay | Admitting: Family Medicine

## 2015-09-13 ENCOUNTER — Telehealth: Payer: Self-pay | Admitting: Family Medicine

## 2015-09-13 NOTE — Telephone Encounter (Signed)
Left a message for patient to return call and let us know where and when she had her mammogram done.  It states in her chart that she had it in 02/2015 but does not say where.

## 2016-01-13 ENCOUNTER — Other Ambulatory Visit: Payer: Self-pay | Admitting: Family Medicine

## 2016-01-20 ENCOUNTER — Ambulatory Visit (INDEPENDENT_AMBULATORY_CARE_PROVIDER_SITE_OTHER): Payer: Commercial Managed Care - HMO | Admitting: Family Medicine

## 2016-01-20 VITALS — BP 120/74 | HR 85 | Temp 98.0°F | Resp 20 | Ht 63.0 in | Wt 220.2 lb

## 2016-01-20 DIAGNOSIS — H409 Unspecified glaucoma: Secondary | ICD-10-CM

## 2016-01-20 DIAGNOSIS — Z119 Encounter for screening for infectious and parasitic diseases, unspecified: Secondary | ICD-10-CM

## 2016-01-20 DIAGNOSIS — E119 Type 2 diabetes mellitus without complications: Secondary | ICD-10-CM | POA: Diagnosis not present

## 2016-01-20 DIAGNOSIS — I10 Essential (primary) hypertension: Secondary | ICD-10-CM

## 2016-01-20 DIAGNOSIS — E785 Hyperlipidemia, unspecified: Secondary | ICD-10-CM | POA: Diagnosis not present

## 2016-01-20 DIAGNOSIS — Z23 Encounter for immunization: Secondary | ICD-10-CM

## 2016-01-20 LAB — COMPREHENSIVE METABOLIC PANEL
ALBUMIN: 4.1 g/dL (ref 3.6–5.1)
ALT: 8 U/L (ref 6–29)
AST: 13 U/L (ref 10–35)
Alkaline Phosphatase: 89 U/L (ref 33–130)
BUN: 20 mg/dL (ref 7–25)
CALCIUM: 9.6 mg/dL (ref 8.6–10.4)
CHLORIDE: 105 mmol/L (ref 98–110)
CO2: 27 mmol/L (ref 20–31)
CREATININE: 1.1 mg/dL — AB (ref 0.50–0.99)
Glucose, Bld: 80 mg/dL (ref 65–99)
POTASSIUM: 4.6 mmol/L (ref 3.5–5.3)
SODIUM: 145 mmol/L (ref 135–146)
TOTAL PROTEIN: 8.1 g/dL (ref 6.1–8.1)
Total Bilirubin: 0.3 mg/dL (ref 0.2–1.2)

## 2016-01-20 LAB — LIPID PANEL
CHOL/HDL RATIO: 4.9 ratio (ref <=5.0)
CHOLESTEROL: 229 mg/dL — AB (ref 125–200)
HDL: 47 mg/dL (ref >=46)
LDL Cholesterol: 161 mg/dL — ABNORMAL HIGH (ref <130)
Triglycerides: 103 mg/dL (ref <150)
VLDL: 21 mg/dL (ref <30)

## 2016-01-20 LAB — CBC
HCT: 37 % (ref 36.0–46.0)
Hemoglobin: 11.1 g/dL — ABNORMAL LOW (ref 12.0–15.0)
MCH: 21.2 pg — AB (ref 26.0–34.0)
MCHC: 30 g/dL (ref 30.0–36.0)
MCV: 70.6 fL — AB (ref 78.0–100.0)
MPV: 10 fL (ref 8.6–12.4)
Platelets: 349 10*3/uL (ref 150–400)
RBC: 5.24 MIL/uL — AB (ref 3.87–5.11)
RDW: 16.7 % — ABNORMAL HIGH (ref 11.5–15.5)
WBC: 8.5 10*3/uL (ref 4.0–10.5)

## 2016-01-20 MED ORDER — LISINOPRIL-HYDROCHLOROTHIAZIDE 10-12.5 MG PO TABS: ORAL_TABLET | ORAL | Status: DC

## 2016-01-20 MED ORDER — ATORVASTATIN CALCIUM 20 MG PO TABS: 20.0000 mg | ORAL_TABLET | Freq: Every day | ORAL | Status: DC

## 2016-01-20 MED ORDER — METFORMIN HCL 500 MG PO TABS: ORAL_TABLET | ORAL | Status: DC

## 2016-01-20 NOTE — Patient Instructions (Signed)
It would be my pleasure to continue to see you as a patient at my new office, starting the last week of February.  If you prefer to remain at [facility] one of my partners will be happy to see you here.   [facility] [address] Shavers [place], [place] [address] Phone: [phone]  I will be in touch with your labs and refilled your medications today.  I also did your referral to Dr. Melene Muller Let's plan to follow-up in about 6 months unless your labs dictate otherwise

## 2016-01-20 NOTE — Progress Notes (Signed)
[facility] [address], [place] [address] [phone] 0000  Date:  01/20/2016   Name:  Eileen Jefferson   DOB:  [birth year]   MRN:  [id number]  PCP:  Kennon Portela, MD    Chief Complaint: Medication Refill   History of Present Illness:  Eileen Jefferson is a 65 y.o. very pleasant female patient who presents with the following:  Here today for a follow-up visit of her DM and HTN Her mother and brother did die in the last year-  She is still taking the same medication  BP Readings from Last 3 Encounters:  01/20/16 120/74  01/11/15 134/74  04/30/14 132/78   She sees Dr. Lanell Matar- she needs a new referral to him.  She sees him every 3-4 months. She does have glaucoma Needs a flu shot  Lab Results  Component Value Date   HGBA1C 6.3* 01/11/2015     Patient Active Problem List   Diagnosis Date Noted  . Diabetic retinopathy ([place]) 05/05/2015  . Obesity 01/11/2015  . Glaucoma 04/06/2014  . Macular degeneration 04/06/2014  . 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 HTN (hypertension) 10/02/2013  . Sarcoidosis ([place]) 10/02/2013  . Diabetes mellitus, type 2 ([place]) 10/02/2013  . OSA (obstructive sleep apnea) 10/23/2012    Past Medical History  Diagnosis Date  . Pulmonary sarcoidosis ([place])   . Diverticulosis   . Iron deficiency anemia   . Hyperlipidemia   . HTN (hypertension)   . Sleep apnea   . Thyroid disease   . Diabetes mellitus without complication ([place])     per patient health survery-borderline    Past Surgical History  Procedure Laterality Date  . C-section      x 2  . Appendectomy    . Tonsillectomy    . Total abdominal hysterectomy    . Tubal ligation    . Cataract extraction      bilateral  . Vitrectomy      bilateral  . Scleral buckle      right    Social History  Substance Use Topics  . Smoking status: Former Smoker -- 0.20 packs/day for 1 years    Types: Cigarettes    Quit date: 12/11/1968   . Smokeless tobacco: Never Used  . Alcohol Use: No    Family History  Problem Relation Age of Onset  . Colon polyps Mother     brothers x2  . Colon cancer Neg Hx   . Heart disease Brother     mother, MGM  . Diabetes Brother     x 2, MGM  . Clotting disorder Mother     brother x 2, MGM  . Stroke Maternal Grandmother     Allergies  Allergen Reactions  . Contrast Media [Iodinated Diagnostic Agents]   . Iohexol Other (See Comments)    " made me feel like I was burning inside"    Medication list has been reviewed and updated.  Current Outpatient Prescriptions on File Prior to Visit  Medication Sig Dispense Refill  . atorvastatin (LIPITOR) 20 MG tablet TAKE 0.5 TABLETS (10 MG TOTAL) BY MOUTH DAILY. 30 tablet 0  . Blood Glucose Monitoring Suppl (CONTOUR NEXT EZ MONITOR) W/DEVICE KIT USE TO TEST GLUCOSE ONCE A DAY OR AS NEEDED 100 kit 11  . brimonidine-timolol (COMBIGAN) 0.2-0.5 % ophthalmic solution Place 1 drop into the left eye 2 (two) times daily.      . brinzolamide (AZOPT) 1 %  ophthalmic suspension Place 1 drop into the left eye 2 (two) times daily.      Marland Kitchen glucose blood (ONE TOUCH ULTRA TEST) test strip Use as instructed 100 each 12  . Lancets MISC Use to test blood sugar once daily or as needed. 100 each 3  . lisinopril-hydrochlorothiazide (PRINZIDE,ZESTORETIC) 10-12.5 MG per tablet TAKE 1 TABLET BY MOUTH DAILY. "OV NEEDED" 30 tablet 0  . metFORMIN (GLUCOPHAGE) 500 MG tablet TAKE 1 TABLET (500 MG TOTAL) BY MOUTH 2 (TWO) TIMES DAILY WITH A MEAL. 180 tablet 0  . prednisoLONE acetate (PRED FORTE) 1 % ophthalmic suspension Place 1 drop into the right eye daily.       No current facility-administered medications on file prior to visit.    Review of Systems:  As per HPI- otherwise negative.   Physical Examination: Filed Vitals:   01/20/16 1050  BP: 120/74  Pulse: 85  Temp: 98 F (36.7 C)  Resp: 20   Filed Vitals:   01/20/16 1050  Height: 5' 3"  (1.6 m)  Weight:  220 lb 3.2 oz (99.882 kg)   Body mass index is 39.02 kg/(m^2). Ideal Body Weight: Weight in (lb) to have BMI = 25: 140.8  GEN: WDWN, NAD, Non-toxic, A & O x 3, obese, looks well HEENT: Atraumatic, Normocephalic. Neck supple. No masses, No LAD. Ears and Nose: No external deformity. CV: RRR, No M/G/R. No JVD. No thrill. No extra heart sounds. PULM: CTA B, no wheezes, crackles, rhonchi. No retractions. No resp. distress. No accessory muscle use. ABD: S, NT, ND, +BS. No rebound. No HSM. EXTR: No c/c/e NEURO Normal gait.  PSYCH: Normally interactive. Conversant. Not depressed or anxious appearing.  Calm demeanor.    Assessment and Plan: Diabetes mellitus type 2 without retinopathy ([place]) - Plan: metFORMIN (GLUCOPHAGE) 500 MG tablet, Comprehensive metabolic panel, Hemoglobin A1c  Glaucoma - Plan: Ambulatory referral to Ophthalmology  Hyperlipidemia - Plan: atorvastatin (LIPITOR) 20 MG tablet, Lipid panel  Essential hypertension - Plan: lisinopril-hydrochlorothiazide (PRINZIDE,ZESTORETIC) 10-12.5 MG tablet, CBC  Immunization due - Plan: Flu Vaccine QUAD 36+ mos IM  Screening examination for infectious disease - Plan: Hepatitis C antibody  Flu shot today Did refills Labs pending  Signed Lamar Blinks, MD

## 2016-01-21 LAB — HEPATITIS C ANTIBODY: HCV Ab: NEGATIVE

## 2016-01-21 LAB — HEMOGLOBIN A1C
Hgb A1c MFr Bld: 6.6 % — ABNORMAL HIGH (ref <5.7)
MEAN PLASMA GLUCOSE: 143 mg/dL — AB (ref <117)

## 2016-01-26 ENCOUNTER — Encounter: Payer: Self-pay | Admitting: Family Medicine

## 2016-01-27 ENCOUNTER — Telehealth: Payer: Self-pay

## 2016-01-27 DIAGNOSIS — E785 Hyperlipidemia, unspecified: Secondary | ICD-10-CM

## 2016-01-27 NOTE — Telephone Encounter (Signed)
Dr Patsy Lager called patient and told her to increase her atorvastatin (LIPITOR) 20 MG tablet To 40 MG   [facility] [address]   [phone]

## 2016-01-29 ENCOUNTER — Ambulatory Visit (INDEPENDENT_AMBULATORY_CARE_PROVIDER_SITE_OTHER): Payer: Commercial Managed Care - HMO | Admitting: Internal Medicine

## 2016-01-29 VITALS — BP 122/76 | HR 86 | Temp 98.6°F | Resp 12 | Ht 63.0 in | Wt 220.0 lb

## 2016-01-29 DIAGNOSIS — J988 Other specified respiratory disorders: Secondary | ICD-10-CM

## 2016-01-29 DIAGNOSIS — J22 Unspecified acute lower respiratory infection: Secondary | ICD-10-CM

## 2016-01-29 MED ORDER — AZITHROMYCIN 500 MG PO TABS: 500.0000 mg | ORAL_TABLET | Freq: Every day | ORAL | Status: DC

## 2016-01-29 MED ORDER — HYDROCODONE-HOMATROPINE 5-1.5 MG/5ML PO SYRP: 5.0000 mL | ORAL_SOLUTION | Freq: Four times a day (QID) | ORAL | Status: DC | PRN

## 2016-01-29 NOTE — Progress Notes (Signed)
   Subjective:    Patient ID: Eileen Jefferson, female    DOB: 01-18-1951, 65 y.o.   MRN: [id number] By signing my name below, I, Littie Deeds, attest that this documentation has been prepared under the direction and in the presence of Ellamae Sia, MD.  Electronically Signed: Littie Deeds, Medical Scribe. 01/29/2016. 9:36 AM.  HPI HPI Comments: Eileen Jefferson is a 65 y.o. female who [facility] complaining of gradual onset sore throat that started 4 days ago. Patient reports having associated productive cough and fever. The cough is worse at night. She has a cough at baseline which she attributes to lisinopril, but her current cough is worse than her usual cough. Patient denies rhinorrhea.   Review of Systems  Constitutional: Positive for fever.  HENT: Positive for sore throat. Negative for rhinorrhea.   Respiratory: Positive for cough.        Objective:   Physical Exam  Constitutional: She is oriented to person, place, and time. She appears well-developed and well-nourished. No distress.  HENT:  Head: Normocephalic and atraumatic.  Nose: Nose normal.  Mouth/Throat: Posterior oropharyngeal erythema present. No oropharyngeal exudate.  Throat red with no exudate.  Eyes: Pupils are equal, round, and reactive to light.  Neck: Neck supple.  Cardiovascular: Normal rate.   Pulmonary/Chest: Effort normal. She has no wheezes. She has rhonchi.  Rhonchi at both bases.  Musculoskeletal: She exhibits no edema.  Neurological: She is alert and oriented to person, place, and time. No cranial nerve deficit.  Skin: Skin is warm and dry. No rash noted.  Psychiatric: She has a normal mood and affect. Her behavior is normal.  Nursing note and vitals reviewed.         Assessment & Plan:  LRI with pharyngitis  Meds ordered this encounter  Medications  . azithromycin (ZITHROMAX) 500 MG tablet    Sig: Take 1 tablet (500 mg total) by mouth daily.   Dispense:  5 tablet    Refill:  0  . HYDROcodone-homatropine (HYCODAN) 5-1.5 MG/5ML syrup    Sig: Take 5 mLs by mouth every 6 (six) hours as needed.    Dispense:  120 mL    Refill:  0     I have completed the patient encounter in its entirety as documented by the scribe, with editing by me where necessary. Robert P. Merla Riches, M.D.

## 2016-01-31 MED ORDER — ATORVASTATIN CALCIUM 40 MG PO TABS: 40.0000 mg | ORAL_TABLET | Freq: Every day | ORAL | Status: DC

## 2016-01-31 NOTE — Telephone Encounter (Signed)
Dr Patsy Lager wrote the following in lab result notes on 01/26/16: "I would like to get your LDL cholesterol lower.  Would you be ok with increasing your dose of atrovastatin to 40 mg?"  Sent in new Rx for the 40 mg tabs. Notified pt done on VM.

## 2016-02-04 DIAGNOSIS — D869 Sarcoidosis, unspecified: Secondary | ICD-10-CM | POA: Diagnosis not present

## 2016-02-04 DIAGNOSIS — E113593 Type 2 diabetes mellitus with proliferative diabetic retinopathy without macular edema, bilateral: Secondary | ICD-10-CM | POA: Diagnosis not present

## 2016-02-04 DIAGNOSIS — H401133 Primary open-angle glaucoma, bilateral, severe stage: Secondary | ICD-10-CM | POA: Diagnosis not present

## 2016-02-05 ENCOUNTER — Other Ambulatory Visit: Payer: Self-pay | Admitting: Family Medicine

## 2016-03-11 DIAGNOSIS — Z1231 Encounter for screening mammogram for malignant neoplasm of breast: Secondary | ICD-10-CM | POA: Diagnosis not present

## 2016-03-11 LAB — HM MAMMOGRAPHY

## 2016-03-29 ENCOUNTER — Encounter: Payer: Self-pay | Admitting: Family Medicine

## 2016-06-28 DIAGNOSIS — E079 Disorder of thyroid, unspecified: Secondary | ICD-10-CM | POA: Insufficient documentation

## 2016-06-28 DIAGNOSIS — E785 Hyperlipidemia, unspecified: Secondary | ICD-10-CM | POA: Insufficient documentation

## 2016-06-28 NOTE — Progress Notes (Signed)
Subjective:    Patient ID: Eileen Jefferson, female    DOB: 08/24/1951, 65 y.o.   MRN: [id number] Chief Complaint  Patient presents with  . Medication Refill    METFORMIN/TEST STRIPS    HPI  Eileen Jefferson is a 65 yo woman who is here for a follow-up on her chronic medical problems.  This is my first time meeting Eileen Jefferson.  DMII: On metformin 500 bid.  Her optho is Dr. Venetia Maxon who Eileen Jefferson sees every 3-4 mos for glaucoma and DM retinopathy - Eileen Jefferson would like a referral again for her insurance as Eileen Jefferson has an appt with him in Aug. Cbgs fasting 105-150, normally around 120s fasting. Rarely checks during day. No lows.  Not taking Diona Fanti but willing to start.  Hemoglobin a1c 7 -> 6.3 -> 6.6.  Urine microalb 02/16 nml HPL: On lipitor 40.  Lipid panel has not been at goal with LDL 185 -> 172 -> 161 HTN: On lisinopril-hctz 10-12.5.  Does check BP at home occ. OSA:  On cpap but with partial compliance - Eileen Jefferson doesn't know why. 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 Does have a goiter. Anemia: Hgb has ben stable at 11 for the past 2 years - nml ferritin.   No vit or supp.  Has been walking in the mornings for exercise and Eileen Jefferson plans to increase this.  Past Medical History  Diagnosis Date  . Pulmonary sarcoidosis ([place])   . Diverticulosis   . Iron deficiency anemia   . Hyperlipidemia   . HTN (hypertension)   . Sleep apnea   . Thyroid disease   . Diabetes mellitus without complication ([place])     per patient health survery-borderline   Past Surgical History  Procedure Laterality Date  . C-section      x 2  . Appendectomy    . Tonsillectomy    . Total abdominal hysterectomy    . Tubal ligation    . Cataract extraction      bilateral  . Vitrectomy      bilateral  . Scleral buckle      right   Current Outpatient Prescriptions on File Prior to Visit  Medication Sig Dispense Refill  . atorvastatin (LIPITOR) 40 MG tablet  Take 1 tablet (40 mg total) by mouth daily at 6 PM. 90 tablet 1  . Blood Glucose Monitoring Suppl (CONTOUR NEXT EZ MONITOR) W/DEVICE KIT USE TO TEST GLUCOSE ONCE A DAY OR AS NEEDED 100 kit 11  . brimonidine-timolol (COMBIGAN) 0.2-0.5 % ophthalmic solution Place 1 drop into the left eye 2 (two) times daily.      . brinzolamide (AZOPT) 1 % ophthalmic suspension Place 1 drop into the left eye 2 (two) times daily.      Marland Kitchen glucose blood (ONE TOUCH ULTRA TEST) test strip Use as instructed 100 each 12  . Lancets MISC Use to test blood sugar once daily or as needed. 100 each 3  . lisinopril-hydrochlorothiazide (PRINZIDE,ZESTORETIC) 10-12.5 MG tablet TAKE 1 TABLET BY MOUTH DAILY. 90 tablet 3  . metFORMIN (GLUCOPHAGE) 500 MG tablet TAKE 1 TABLET (500 MG TOTAL) BY MOUTH 2 (TWO) TIMES DAILY WITH A MEAL. 180 tablet 3   No current facility-administered medications on file prior to visit.   Allergies  Allergen Reactions  . Contrast Media [Iodinated Diagnostic Agents]   . Iohexol Other (See Comments)    " made me feel like I was burning inside"  Family History  Problem Relation Age of Onset  . Colon polyps Mother     brothers x2  . Colon cancer Neg Hx   . Heart disease Brother     mother, MGM  . Diabetes Brother     x 2, MGM  . Clotting disorder Mother     brother x 2, MGM  . Stroke Maternal Grandmother    Social History   Social History  . Marital Status: Married    Spouse Name: N/A  . Number of Children: 2  . Years of Education: N/A   Occupational History  . behavioral health    Social History Main Topics  . Smoking status: Former Smoker -- 0.20 packs/day for 1 years    Types: Cigarettes    Quit date: 12/11/1968  . Smokeless tobacco: Never Used  . Alcohol Use: No  . Drug Use: No  . Sexual Activity: Not Asked   Other Topics Concern  . None   Social History Narrative    Review of Systems  Constitutional: Positive for activity change and fatigue. Negative for appetite change,  chills, diaphoresis, fever and unexpected weight change.  Eyes: Positive for visual disturbance.  Respiratory: Negative for cough and shortness of breath.   Cardiovascular: Negative for chest pain, palpitations and leg swelling.  Genitourinary: Negative for decreased urine volume.  Neurological: Negative for syncope and headaches.  Hematological: Does not bruise/bleed easily.       Objective:  BP 130/82 mmHg  Pulse 97  Temp(Src) 97.8 F (36.6 C) (Oral)  Resp 18  Ht 5' 3" (1.6 m)  Wt 220 lb (99.791 kg)  BMI 38.98 kg/m2  SpO2 100%  Physical Exam  Constitutional: Eileen Jefferson is oriented to person, place, and time. Eileen Jefferson appears well-developed and well-nourished. No distress.  HENT:  Head: Normocephalic and atraumatic.  Right Ear: External ear normal.  Left Ear: External ear normal.  Eyes: Conjunctivae are normal. No scleral icterus.  Neck: Normal range of motion. Neck supple. No thyromegaly present.  Cardiovascular: Normal rate, regular rhythm, normal heart sounds and intact distal pulses.   Pulmonary/Chest: Effort normal and breath sounds normal. No respiratory distress.  Musculoskeletal: Eileen Jefferson exhibits no edema.  Lymphadenopathy:    Eileen Jefferson has no cervical adenopathy.  Neurological: Eileen Jefferson is alert and oriented to person, place, and time.  Skin: Skin is warm and dry. Eileen Jefferson is not diaphoretic. No erythema.  Psychiatric: Eileen Jefferson has a normal mood and affect. Her behavior is normal.       Diabetic Foot Exam - Simple   Simple Foot Form  Diabetic Foot exam was performed with the following findings:  Yes 06/29/2016  8:39 AM  Visual Inspection  No deformities, no ulcerations, no other skin breakdown bilaterally:  Yes  Sensation Testing  Intact to touch and monofilament testing bilaterally:  Yes  Pulse Check  Posterior Tibialis and Dorsalis pulse intact bilaterally:  Yes  Comments      Assessment & Plan:   1. Type 2 diabetes mellitus with retinopathy, without long-term current use of insulin,  macular edema presence unspecified, unspecified laterality, unspecified retinopathy severity ([place]) - Cr. Is at baseline of 1 - 1.1 but cont to monitor as eGFR today 61. Current Hemoglobin a1c 7 -> 6.3 -> 6.6 -> 6.5 so well controlled, continue current regimen of metformin  2. Hyperlipidemia - LDL still not near goal at 175.  Increase atorvastatin from 40 to 80. Recheck alt at next OV in 3 mos, then repeat fasting labs in 6 mos.  3. Essential hypertension - at goal, cont lisinopril-hctz  4. OSA (obstructive sleep apnea)  - pt will try to increase her compliance with cpap  5. Sarcoidosis ([place])   6. Thyroid disease - resolved hypothyroid, cont to monitor  7. Anemia, unspecified anemia type - stable, likely congenital - cons hgb electrophoresis at f/u  8. Screening for STD (sexually transmitted disease)   9.      Imm - sent rx for zostavax to pt's pharmacy  Orders Placed This Encounter  Procedures  . Lipid panel    Order Specific Question:   Has the patient fasted?    Answer:   Yes  . Comprehensive metabolic panel    Order Specific Question:   Has the patient fasted?    Answer:   Yes  . CBC  . HIV antibody  . TSH  . Microalbumin/Creatinine Ratio, Urine  . POCT glycosylated hemoglobin (Hb A1C)  . HM DIABETES FOOT EXAM    Meds ordered this encounter  Medications  . glucose blood (ONE TOUCH ULTRA TEST) test strip    Sig: Use as instructed    Dispense:  100 each    Refill:  12    PLEASE DISPENSE STRIPS COORDINATED WITH MONITOR PT HAS- INSURANCE WILL COVER.  Marland Kitchen atorvastatin (LIPITOR) 80 MG tablet    Sig: Take 1 tablet (80 mg total) by mouth daily at 6 PM.    Dispense:  90 tablet    Refill:  1    THIS IS AN INCREASE IN DOSE. PLS DC ANY RFs LEFT OF 40 MG.    I personally performed the services described in this documentation, which was scribed in my presence. The recorded information has been reviewed and considered, and addended by me as needed.   [name], M.D.  [facility] [address], [place] [address] [phone] phone [phone] fax  07/02/16 11:15 AM  Results for orders placed or performed in visit on 06/29/16  Lipid panel  Result Value Ref Range   Cholesterol 239 (H) 125 - 200 mg/dL   Triglycerides 84 <150 mg/dL   HDL 47 >=46 mg/dL   Total CHOL/HDL Ratio 5.1 (H) <=5.0 Ratio   VLDL 17 <30 mg/dL   LDL Cholesterol 175 (H) <130 mg/dL  Comprehensive metabolic panel  Result Value Ref Range   Sodium 142 135 - 146 mmol/L   Potassium 4.4 3.5 - 5.3 mmol/L   Chloride 104 98 - 110 mmol/L   CO2 30 20 - 31 mmol/L   Glucose, Bld 95 65 - 99 mg/dL   BUN 23 7 - 25 mg/dL   Creat 1.11 (H) 0.50 - 0.99 mg/dL   Total Bilirubin 0.3 0.2 - 1.2 mg/dL   Alkaline Phosphatase 75 33 - 130 U/L   AST 12 10 - 35 U/L   ALT 9 6 - 29 U/L   Total Protein 7.9 6.1 - 8.1 g/dL   Albumin 4.0 3.6 - 5.1 g/dL   Calcium 9.6 8.6 - 10.4 mg/dL  CBC  Result Value Ref Range   WBC 7.4 3.8 - 10.8 K/uL   RBC 5.26 (H) 3.80 - 5.10 MIL/uL   Hemoglobin 11.1 (L) 11.7 - 15.5 g/dL   HCT 37.0 35.0 - 45.0 %   MCV 70.3 (L) 80.0 - 100.0 fL   MCH 21.1 (L) 27.0 - 33.0 pg   MCHC 30.0 (L) 32.0 - 36.0 g/dL   RDW 16.4 (H) 11.0 - 15.0 %  Platelets 368 140 - 400 K/uL   MPV 9.8 7.5 - 12.5 fL  HIV antibody  Result Value Ref Range   HIV 1&2 Ab, 4th Generation NONREACTIVE NONREACTIVE  TSH  Result Value Ref Range   TSH 1.39 mIU/L  POCT glycosylated hemoglobin (Hb A1C)  Result Value Ref Range   Hemoglobin A1C 6.5

## 2016-06-29 ENCOUNTER — Encounter: Payer: Self-pay | Admitting: Family Medicine

## 2016-06-29 ENCOUNTER — Ambulatory Visit (INDEPENDENT_AMBULATORY_CARE_PROVIDER_SITE_OTHER): Payer: Commercial Managed Care - HMO | Admitting: Family Medicine

## 2016-06-29 VITALS — BP 130/82 | HR 97 | Temp 97.8°F | Resp 18 | Ht 63.0 in | Wt 220.0 lb

## 2016-06-29 DIAGNOSIS — G4733 Obstructive sleep apnea (adult) (pediatric): Secondary | ICD-10-CM | POA: Diagnosis not present

## 2016-06-29 DIAGNOSIS — E11319 Type 2 diabetes mellitus with unspecified diabetic retinopathy without macular edema: Secondary | ICD-10-CM | POA: Diagnosis not present

## 2016-06-29 DIAGNOSIS — I10 Essential (primary) hypertension: Secondary | ICD-10-CM

## 2016-06-29 DIAGNOSIS — E079 Disorder of thyroid, unspecified: Secondary | ICD-10-CM | POA: Diagnosis not present

## 2016-06-29 DIAGNOSIS — D649 Anemia, unspecified: Secondary | ICD-10-CM | POA: Diagnosis not present

## 2016-06-29 DIAGNOSIS — D869 Sarcoidosis, unspecified: Secondary | ICD-10-CM

## 2016-06-29 DIAGNOSIS — Z113 Encounter for screening for infections with a predominantly sexual mode of transmission: Secondary | ICD-10-CM

## 2016-06-29 DIAGNOSIS — E785 Hyperlipidemia, unspecified: Secondary | ICD-10-CM | POA: Diagnosis not present

## 2016-06-29 LAB — COMPREHENSIVE METABOLIC PANEL
ALBUMIN: 4 g/dL (ref 3.6–5.1)
ALT: 9 U/L (ref 6–29)
AST: 12 U/L (ref 10–35)
Alkaline Phosphatase: 75 U/L (ref 33–130)
BILIRUBIN TOTAL: 0.3 mg/dL (ref 0.2–1.2)
BUN: 23 mg/dL (ref 7–25)
CHLORIDE: 104 mmol/L (ref 98–110)
CO2: 30 mmol/L (ref 20–31)
CREATININE: 1.11 mg/dL — AB (ref 0.50–0.99)
Calcium: 9.6 mg/dL (ref 8.6–10.4)
GLUCOSE: 95 mg/dL (ref 65–99)
Potassium: 4.4 mmol/L (ref 3.5–5.3)
SODIUM: 142 mmol/L (ref 135–146)
Total Protein: 7.9 g/dL (ref 6.1–8.1)

## 2016-06-29 LAB — CBC
HEMATOCRIT: 37 % (ref 35.0–45.0)
Hemoglobin: 11.1 g/dL — ABNORMAL LOW (ref 11.7–15.5)
MCH: 21.1 pg — AB (ref 27.0–33.0)
MCHC: 30 g/dL — AB (ref 32.0–36.0)
MCV: 70.3 fL — AB (ref 80.0–100.0)
MPV: 9.8 fL (ref 7.5–12.5)
PLATELETS: 368 10*3/uL (ref 140–400)
RBC: 5.26 MIL/uL — ABNORMAL HIGH (ref 3.80–5.10)
RDW: 16.4 % — AB (ref 11.0–15.0)
WBC: 7.4 10*3/uL (ref 3.8–10.8)

## 2016-06-29 LAB — TSH: TSH: 1.39 mIU/L

## 2016-06-29 LAB — LIPID PANEL
CHOL/HDL RATIO: 5.1 ratio — AB (ref <=5.0)
Cholesterol: 239 mg/dL — ABNORMAL HIGH (ref 125–200)
HDL: 47 mg/dL (ref >=46)
LDL CALC: 175 mg/dL — AB (ref <130)
Triglycerides: 84 mg/dL (ref <150)
VLDL: 17 mg/dL (ref <30)

## 2016-06-29 LAB — POCT GLYCOSYLATED HEMOGLOBIN (HGB A1C): HEMOGLOBIN A1C: 6.5

## 2016-06-29 LAB — HIV ANTIBODY (ROUTINE TESTING W REFLEX): HIV: NONREACTIVE

## 2016-06-29 MED ORDER — GLUCOSE BLOOD VI STRP: ORAL_STRIP | Status: DC

## 2016-06-29 NOTE — Patient Instructions (Addendum)
IF you received an x-ray today, you will receive an invoice from [facility]. Please contact [facility] at [phone] with questions or concerns regarding your invoice.   IF you received labwork today, you will receive an invoice from [facility]. Please contact [facility] at [phone] with questions or concerns regarding your invoice.   Our billing staff will not be able to assist you with questions regarding bills from these companies.  You will be contacted with the lab results as soon as they are available. The fastest way to get your results is to activate your My Chart account. Instructions are located on the last page of this paperwork. If you have not heard from us regarding the results in 2 weeks, please contact this office.    Bone Health Bones protect organs, store calcium, and anchor muscles. Good health habits, such as eating nutritious foods and exercising regularly, are important for maintaining healthy bones. They can also help to prevent a condition that causes bones to lose density and become weak and brittle (osteoporosis). WHY IS BONE MASS IMPORTANT? Bone mass refers to the amount of bone tissue that you have. The higher your bone mass, the stronger your bones. An important step toward having healthy bones throughout life is to have strong and dense bones during childhood. A young adult who has a high bone mass is more likely to have a high bone mass later in life. Bone mass at its greatest it is called peak bone mass. A large decline in bone mass occurs in older adults. In women, it occurs about the time of menopause. During this time, it is important to practice good health habits, because if more bone is lost than what is replaced, the bones will become less healthy and more likely to break (fracture). If you find that you have a low bone mass, you may be able to prevent osteoporosis or further bone loss by changing your diet  and lifestyle. HOW CAN I FIND OUT IF MY BONE MASS IS LOW? Bone mass can be measured with an X-ray test that is called a bone mineral density (BMD) test. This test is recommended for all women who are age 63 or older. It may also be recommended for men who are age 63 or older, or for people who are more likely to develop osteoporosis due to:  Having bones that break easily.  Having a long-term disease that weakens bones, such as kidney disease or rheumatoid arthritis.  Having menopause earlier than normal.  Taking medicine that weakens bones, such as steroids, thyroid hormones, or hormone treatment for breast cancer or prostate cancer.  Smoking.  Drinking three or more alcoholic drinks each day. WHAT ARE THE NUTRITIONAL RECOMMENDATIONS FOR HEALTHY BONES? To have healthy bones, you need to get enough of the right minerals and vitamins. Most nutrition experts recommend getting these nutrients from the foods that you eat. Nutritional recommendations vary from person to person. Ask your health care provider what is healthy for you. Here are some general guidelines. Calcium Recommendations Calcium is the most important (essential) mineral for bone health. Most people can get enough calcium from their diet, but supplements may be recommended for people who are at risk for osteoporosis. Good sources of calcium include: 1. Dairy products, such as low-fat or nonfat milk, cheese, and yogurt. 2. Dark green leafy vegetables, such as bok choy and broccoli. 3. Calcium-fortified foods, such as orange juice, cereal, bread, soy beverages, and tofu products. 4.  Nuts, such as almonds. Follow these recommended amounts for daily calcium intake:  Children, age 60-3: 700 mg.  Children, age 88-8: 1,000 mg.  Children, age 37-13: 1,300 mg.  Teens, age 32-18: 1,300 mg.  Adults, age 62-50: 1,000 mg.  Adults, age 65-70:  Men: 1,000 mg.  Women: 1,200 mg.  Adults, age 73 or older: 1,200 mg.  Pregnant and  breastfeeding females:  Teens: 1,300 mg.  Adults: 1,000 mg. Vitamin D Recommendations Vitamin D is the most essential vitamin for bone health. It helps the body to absorb calcium. Sunlight stimulates the skin to make vitamin D, so be sure to get enough sunlight. If you live in a cold climate or you do not get outside often, your health care provider may recommend that you take vitamin D supplements. Good sources of vitamin D in your diet include:  Egg yolks.  Saltwater fish.  Milk and cereal fortified with vitamin D. Follow these recommended amounts for daily vitamin D intake:  Children and teens, age 60-18: 600 international units.  Adults, age 67 or younger: 400-800 international units.  Adults, age 82 or older: 800-1,000 international units. Other Nutrients Other nutrients for bone health include:  Phosphorus. This mineral is found in meat, poultry, dairy foods, nuts, and legumes. The recommended daily intake for adult men and adult women is 700 mg.  Magnesium. This mineral is found in seeds, nuts, dark green vegetables, and legumes. The recommended daily intake for adult men is 400-420 mg. For adult women, it is 310-320 mg.  Vitamin K. This vitamin is found in green leafy vegetables. The recommended daily intake is 120 mg for adult men and 90 mg for adult women. WHAT TYPE OF PHYSICAL ACTIVITY IS BEST FOR BUILDING AND MAINTAINING HEALTHY BONES? Weight-bearing and strength-building activities are important for building and maintaining peak bone mass. Weight-bearing activities cause muscles and bones to work against gravity. Strength-building activities increases muscle strength that supports bones. Weight-bearing and muscle-building activities include:  Walking and hiking.  Jogging and running.  Dancing.  Gym exercises.  Lifting weights.  Tennis and racquetball.  Climbing stairs.  Aerobics. Adults should get at least 30 minutes of moderate physical activity on most  days. Children should get at least 60 minutes of moderate physical activity on most days. Ask your health care provide what type of exercise is best for you. WHERE CAN I FIND MORE INFORMATION? For more information, check out the following websites:  National Osteoporosis Foundation: http://burton-owens.org/  [employer]: http://www.niams.http://www.johnson-fowler.biz/.asp   This information is not intended to replace advice given to you by your health care provider. Make sure you discuss any questions you have with your health care provider.   Document Released: 02/17/2004 Document Revised: 04/13/2015 Document Reviewed: 12/02/2014 Elsevier Interactive Patient Education 2016 Elsevier Inc. Sleep Apnea  Sleep apnea is a sleep disorder characterized by abnormal pauses in breathing while you sleep. When your breathing pauses, the level of oxygen in your blood decreases. This causes you to move out of deep sleep and into light sleep. As a result, your quality of sleep is poor, and the system that carries your blood throughout your body (cardiovascular system) experiences stress. If sleep apnea remains untreated, the following conditions can develop:  High blood pressure (hypertension).  Coronary artery disease.  Inability to achieve or maintain an erection (impotence).  Impairment of your thought process (cognitive dysfunction). There are three types of sleep apnea: 5. Obstructive sleep apnea--Pauses in breathing during sleep because of a blocked  airway. 6. Central sleep apnea--Pauses in breathing during sleep because the area of the brain that controls your breathing does not send the correct signals to the muscles that control breathing. 7. Mixed sleep apnea--A combination of both obstructive and central sleep apnea. RISK FACTORS The following risk factors can increase your risk of developing sleep apnea:  Being  overweight.  Smoking.  Having narrow passages in your nose and throat.  Being of older age.  Being female.  Alcohol use.  Sedative and tranquilizer use.  Ethnicity. Among individuals younger than 35 years, African Americans are at increased risk of sleep apnea. SYMPTOMS   Difficulty staying asleep.  Daytime sleepiness and fatigue.  Loss of energy.  Irritability.  Loud, heavy snoring.  Morning headaches.  Trouble concentrating.  Forgetfulness.  Decreased interest in sex.  Unexplained sleepiness. DIAGNOSIS  In order to diagnose sleep apnea, your caregiver will perform a physical examination. A sleep study done in the comfort of your own home may be appropriate if you are otherwise healthy. Your caregiver may also recommend that you spend the night in a sleep lab. In the sleep lab, several monitors record information about your heart, lungs, and brain while you sleep. Your leg and arm movements and blood oxygen level are also recorded. TREATMENT The following actions may help to resolve mild sleep apnea:  Sleeping on your side.   Using a decongestant if you have nasal congestion.   Avoiding the use of depressants, including alcohol, sedatives, and narcotics.   Losing weight and modifying your diet if you are overweight. There also are devices and treatments to help open your airway:  Oral appliances. These are custom-made mouthpieces that shift your lower jaw forward and slightly open your bite. This opens your airway.  Devices that create positive airway pressure. This positive pressure "splints" your airway open to help you breathe better during sleep. The following devices create positive airway pressure:  Continuous positive airway pressure (CPAP) device. The CPAP device creates a continuous level of air pressure with an air pump. The air is delivered to your airway through a mask while you sleep. This continuous pressure keeps your airway open.  Nasal  expiratory positive airway pressure (EPAP) device. The EPAP device creates positive air pressure as you exhale. The device consists of single-use valves, which are inserted into each nostril and held in place by adhesive. The valves create very little resistance when you inhale but create much more resistance when you exhale. That increased resistance creates the positive airway pressure. This positive pressure while you exhale keeps your airway open, making it easier to breath when you inhale again.  Bilevel positive airway pressure (BPAP) device. The BPAP device is used mainly in patients with central sleep apnea. This device is similar to the CPAP device because it also uses an air pump to deliver continuous air pressure through a mask. However, with the BPAP machine, the pressure is set at two different levels. The pressure when you exhale is lower than the pressure when you inhale.  Surgery. Typically, surgery is only done if you cannot comply with less invasive treatments or if the less invasive treatments do not improve your condition. Surgery involves removing excess tissue in your airway to create a wider passage way.   This information is not intended to replace advice given to you by your health care provider. Make sure you discuss any questions you have with your health care provider.   Document Released: 11/17/2002 Document Revised: 12/18/2014 Document  Reviewed: 04/04/2012 Elsevier Interactive Patient Education [employer]

## 2016-07-02 ENCOUNTER — Encounter: Payer: Self-pay | Admitting: Family Medicine

## 2016-07-02 MED ORDER — ZOSTER VACCINE LIVE 19400 UNT/0.65ML ~~LOC~~ SUSR: 0.6500 mL | Freq: Once | SUBCUTANEOUS | 0 refills | Status: AC

## 2016-07-02 MED ORDER — ATORVASTATIN CALCIUM 80 MG PO TABS: 80.0000 mg | ORAL_TABLET | Freq: Every day | ORAL | 1 refills | Status: DC

## 2016-10-02 DIAGNOSIS — H401133 Primary open-angle glaucoma, bilateral, severe stage: Secondary | ICD-10-CM | POA: Diagnosis not present

## 2016-10-02 DIAGNOSIS — D869 Sarcoidosis, unspecified: Secondary | ICD-10-CM | POA: Diagnosis not present

## 2016-10-02 DIAGNOSIS — E113593 Type 2 diabetes mellitus with proliferative diabetic retinopathy without macular edema, bilateral: Secondary | ICD-10-CM | POA: Diagnosis not present

## 2016-10-25 ENCOUNTER — Other Ambulatory Visit: Payer: Self-pay

## 2016-10-25 DIAGNOSIS — I10 Essential (primary) hypertension: Secondary | ICD-10-CM

## 2016-10-25 DIAGNOSIS — E119 Type 2 diabetes mellitus without complications: Secondary | ICD-10-CM

## 2016-10-25 MED ORDER — GLUCOSE BLOOD VI STRP: ORAL_STRIP | 2 refills | Status: DC

## 2016-10-25 MED ORDER — ACCU-CHEK AVIVA VI SOLN: 2 refills | Status: AC

## 2016-10-25 MED ORDER — ALCOHOL SWABS PADS: MEDICATED_PAD | 2 refills | Status: DC

## 2016-10-25 MED ORDER — LISINOPRIL-HYDROCHLOROTHIAZIDE 10-12.5 MG PO TABS: ORAL_TABLET | ORAL | 0 refills | Status: DC

## 2016-10-25 MED ORDER — ATORVASTATIN CALCIUM 80 MG PO TABS: 80.0000 mg | ORAL_TABLET | Freq: Every day | ORAL | 0 refills | Status: DC

## 2016-10-25 MED ORDER — METFORMIN HCL 500 MG PO TABS: ORAL_TABLET | ORAL | 0 refills | Status: DC

## 2016-10-25 MED ORDER — ACCU-CHEK AVIVA PLUS W/DEVICE KIT: PACK | 0 refills | Status: AC

## 2016-10-25 MED ORDER — ACCU-CHEK SOFTCLIX LANCETS MISC: 2 refills | Status: DC

## 2017-01-05 ENCOUNTER — Other Ambulatory Visit: Payer: Self-pay | Admitting: Family Medicine

## 2017-01-05 DIAGNOSIS — E785 Hyperlipidemia, unspecified: Secondary | ICD-10-CM

## 2017-01-05 NOTE — Telephone Encounter (Signed)
Meds ordered this encounter  Medications  . atorvastatin (LIPITOR) 80 MG tablet    Sig: TAKE 1 TABLET BY MOUTH EVERY DAY AT 6PM    Dispense:  90 tablet    Refill:  0   Left message for patient that Rx sent and reminder that needs OV and fasting labs before this supply is exhausted.

## 2017-01-10 NOTE — Progress Notes (Signed)
Subjective:    Eileen Jefferson is a 66 y.o. female who presents for Medicare Annual/Subsequent preventive examination.  Preventive Screening-Counseling & Management  Tobacco History  Smoking Status  . Former Smoker  . Packs/day: 0.20  . Years: 1.00  . Types: Cigarettes  . Quit date: 12/11/1968  Smokeless Tobacco  . Never Used     Problems Prior to Visit 1. DMII: On metformin 500 bid.  Her optho is Dr. Venetia Maxon who she sees every 3-4 mos for glaucoma and DM retinopathy - she would like a referral again for her insurance as she had an appt with him in Aug 2017. Cbgs fasting 105-150, normally around 120s fasting. Rarely checks during day. No lows.  taking Asa.  Hemoglobin a1c 7 -> 6.3 -> 6.6 ->6.5.  Urine microalb 02/16 nml. cbg this a.m. Was 112. 2. HPL: On lipitor 40.  Lipid panel has not been at goal with LDL 185 -> 172 -> 161-> 175 at last visit so atorvastatin was increased from 40 to 80. She had a 7-up today but is otherwise fasting. 3. HTN: On lisinopril-hctz 10-12.5.  Does check BP at home occ stays 130s/70s. None >140. 4. OSA:  On cpap but with partial compliance - she doesn't know why. 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 Thyroid disease:  Was hypothyroid but it spontaneous recovered. Does have a goiter. 7. Anemia: Hgb has ben stable at 11 for the past 2 years - nml ferritin.  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 No vit or supp.    Current Problems (verified) Patient Active Problem List   Diagnosis Date Noted  . Hyperlipidemia 06/28/2016  . Thyroid disease 06/28/2016  . Diabetic retinopathy ([place]) 05/05/2015  . Obesity 01/11/2015  . Glaucoma 04/06/2014  . Macular degeneration 04/06/2014  . 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 HTN (hypertension) 10/02/2013  . Sarcoidosis ([place]) 10/02/2013  . Diabetes mellitus, type 2 ([place]) 10/02/2013  . OSA (obstructive sleep apnea) 10/23/2012    Medications Prior to Visit Current Outpatient  Prescriptions on File Prior to Visit  Medication Sig Dispense Refill  . ACCU-CHEK SOFTCLIX LANCETS lancets Test blood sugar once daily. Dx: E11.9 100 each 2  . Alcohol Swabs PADS Test blood sugar once daily. Dx: E11.9 100 each 2  . atorvastatin (LIPITOR) 80 MG tablet Take 1 tablet (80 mg total) by mouth daily at 6 PM. 90 tablet 0  . atorvastatin (LIPITOR) 80 MG tablet TAKE 1 TABLET BY MOUTH EVERY DAY AT 6PM 90 tablet 0  . Blood Glucose Calibration (ACCU-CHEK AVIVA) SOLN Test blood sugar once daily. Dx: E11.9 1 each 2  . Blood Glucose Monitoring Suppl (ACCU-CHEK AVIVA PLUS) w/Device KIT Test blood sugar once daily. Dx: E11.9 1 kit 0  . brimonidine-timolol (COMBIGAN) 0.2-0.5 % ophthalmic solution Place 1 drop into the left eye 2 (two) times daily.      . brinzolamide (AZOPT) 1 % ophthalmic suspension Place 1 drop into the left eye 2 (two) times daily.      Marland Kitchen glucose blood (ACCU-CHEK AVIVA PLUS) test strip Test blood sugar once daily. Dx: E11.9 100 each 2  . lisinopril-hydrochlorothiazide (PRINZIDE,ZESTORETIC) 10-12.5 MG tablet TAKE 1 TABLET BY MOUTH DAILY. 90 tablet 0  . metFORMIN (GLUCOPHAGE) 500 MG tablet TAKE 1 TABLET (500 MG TOTAL) BY MOUTH 2 (TWO) TIMES DAILY WITH A MEAL. 180 tablet 0   No current facility-administered medications on file prior to visit.     Current Medications (verified) Current Outpatient Prescriptions  Medication  Sig Dispense Refill  . ACCU-CHEK SOFTCLIX LANCETS lancets Test blood sugar once daily. Dx: E11.9 100 each 2  . Alcohol Swabs PADS Test blood sugar once daily. Dx: E11.9 100 each 2  . atorvastatin (LIPITOR) 80 MG tablet Take 1 tablet (80 mg total) by mouth daily at 6 PM. 90 tablet 0  . atorvastatin (LIPITOR) 80 MG tablet TAKE 1 TABLET BY MOUTH EVERY DAY AT 6PM 90 tablet 0  . Blood Glucose Calibration (ACCU-CHEK AVIVA) SOLN Test blood sugar once daily. Dx: E11.9 1 each 2  . Blood Glucose Monitoring Suppl (ACCU-CHEK AVIVA PLUS) w/Device KIT Test blood sugar once  daily. Dx: E11.9 1 kit 0  . brimonidine-timolol (COMBIGAN) 0.2-0.5 % ophthalmic solution Place 1 drop into the left eye 2 (two) times daily.      . brinzolamide (AZOPT) 1 % ophthalmic suspension Place 1 drop into the left eye 2 (two) times daily.      Marland Kitchen glucose blood (ACCU-CHEK AVIVA PLUS) test strip Test blood sugar once daily. Dx: E11.9 100 each 2  . lisinopril-hydrochlorothiazide (PRINZIDE,ZESTORETIC) 10-12.5 MG tablet TAKE 1 TABLET BY MOUTH DAILY. 90 tablet 0  . metFORMIN (GLUCOPHAGE) 500 MG tablet TAKE 1 TABLET (500 MG TOTAL) BY MOUTH 2 (TWO) TIMES DAILY WITH A MEAL. 180 tablet 0   No current facility-administered medications for this visit.      Allergies (verified) Contrast media [iodinated diagnostic agents] and Iohexol   PAST HISTORY  Family History Family History  Problem Relation Age of Onset  . Colon polyps Mother     brothers x2  . Colon cancer Neg Hx   . Heart disease Brother     mother, MGM  . Diabetes Brother     x 2, MGM  . Clotting disorder Mother     brother x 2, MGM  . Stroke Maternal Grandmother     Social History Social History  Substance Use Topics  . Smoking status: Former Smoker    Packs/day: 0.20    Years: 1.00    Types: Cigarettes    Quit date: 12/11/1968  . Smokeless tobacco: Never Used  . Alcohol use No     Are there smokers in your home (other than you)? No - with husband, daughter moved back in temp as transferred to [employer] and she is married with a 13 yo son  Risk Factors Current exercise habits: Home exercise routine includes walking a little less  Dietary issues discussed: Trying heart healthy. No vit/supp.  Cardiac risk factors: advanced age (older than 68 for men, 13 for women), diabetes mellitus, dyslipidemia, hypertension, microalbuminuria, obesity (BMI >= 30 kg/m2) and sedentary lifestyle.  Depression Screen Depression screen [facility] 2/9 01/11/2017 06/29/2016 01/20/2016 04/06/2014  Decreased Interest 0 0 0 0  Down, Depressed, Hopeless 0 0 0  0  PHQ - 2 Score 0 0 0 0   Activities of Daily Living In your present state of health, do you have any difficulty performing the following activities?:  Driving? 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  No Feeding yourself? No Getting from bed to chair? No Climbing a flight of stairs? No Preparing food and eating?: No Bathing or showering? No Getting dressed: No Getting to the toilet? No Using the toilet:No Moving around from place to place: No In the past year have you fallen or had a near fall?:No Her husband had knee replacement so got their house set up for safety with bathroom bars etc.  Are you sexually active?  No  Do you have more than one partner?  No  Hearing Difficulties: No Do you often ask people to speak up or repeat themselves? No Do you experience ringing or noises in your ears? No Do you have difficulty understanding soft or whispered voices? No   Do you feel that you have a problem with memory? No  Do you often misplace items? No  Do you feel safe at home?  Yes  Cognitive Testing  Alert? Yes  Normal Appearance?Yes  Oriented to person? Yes  Place? Yes   Time? Yes  Recall of three objects?  Yes  Can perform simple calculations? Yes  Displays appropriate judgment?Yes  Can read the correct time from a watch face?Yes   Advanced Directives have been discussed with the patient? Yes  List the Names of Other Physician/Practitioners you currently use: 1.    Indicate any recent Medical Services you may have received from other than [facility] providers in the past year (date may be approximate).  Immunization History  Administered Date(s) Administered  . Influenza,inj,Quad PF,36+ Mos 10/02/2013, 01/11/2015, 01/20/2016  . Pneumococcal Polysaccharide-23 10/02/2013  . Tdap 01/11/2015    Screening Tests Health Maintenance  Topic Date Due  . ZOSTAVAX  05/05/2013  . INFLUENZA VACCINE  07/11/2016  . PAP SMEAR  10/02/2016  .  HEMOGLOBIN A1C  12/30/2016  . OPHTHALMOLOGY EXAM  04/10/2017  . FOOT EXAM  06/29/2017  . MAMMOGRAM  03/11/2018  . PNEUMOCOCCAL POLYSACCHARIDE VACCINE (2) 10/02/2018  . COLONOSCOPY  07/27/2021  . TETANUS/TDAP  01/11/2025  . Hepatitis C Screening  Completed  . HIV Screening  Completed    All answers were reviewed with the patient and necessary referrals were made:  Geoffery Aultman, MD   01/10/2017   History reviewed: allergies, current medications, past family history, past medical history, past social history, past surgical history and problem list  Review of Systems A comprehensive review of systems was negative.    Objective:  BP 133/75   Pulse 72   Temp 98.3 F (36.8 C) (Oral)   Resp 18   Ht 5' 3"  (1.6 m)   Wt 219 lb (99.3 kg)   SpO2 97%   BMI 38.79 kg/m    Visual Acuity Screening   Right eye Left eye Both eyes  Without correction: 20/200 20/30 20/30  With correction:       BP 133/75   Pulse 72   Temp 98.3 F (36.8 C) (Oral)   Resp 18   Ht 5' 3"  (1.6 m)   Wt 219 lb (99.3 kg)   SpO2 97%   BMI 38.79 kg/m   General Appearance:    Alert, cooperative, no distress, appears stated age  Head:    Normocephalic, without obvious abnormality, atraumatic  Eyes:    PERRL, conjunctiva/corneas clear, EOM's intact, fundi    benign, both eyes  Ears:    Normal TM's and external ear canals, both ears  Nose:   Nares normal, septum midline, mucosa normal, no drainage    or sinus tenderness  Throat:   Lips, mucosa, and tongue normal; teeth and gums normal  Neck:   Supple, symmetrical, trachea midline, no adenopathy;    thyroid:  no enlargement/tenderness/nodules; no carotid   bruit or JVD  Back:     Symmetric, no curvature, ROM normal, no CVA tenderness  Lungs:     Clear to auscultation bilaterally, respirations unlabored  Chest Wall:    No tenderness or deformity   Heart:  Regular rate and rhythm, S1 and S2 normal, no murmur, rub   or gallop  Breast Exam:    No tenderness,  masses, or nipple abnormality  Abdomen:     Soft, non-tender, bowel sounds active all four quadrants,    no masses, no organomegaly        Extremities:   Extremities normal, atraumatic, no cyanosis or edema  Pulses:   2+ and symmetric all extremities  Skin:   Skin color, texture, turgor normal, no rashes or lesions  Lymph nodes:   Cervical, supraclavicular, and axillary nodes normal  Neurologic:   CNII-XII intact, normal strength, sensation and reflexes    throughout       Assessment:     1. Medicare annual wellness visit, subsequent   2. Encounter for screening mammogram for breast cancer   3. Screening for cardiovascular, respiratory, and genitourinary diseases   4. Screening for cervical cancer   5. Screening for colorectal cancer   6. Routine screening for STI (sexually transmitted infection)   7. Type 2 diabetes mellitus with retinopathy, without long-term current use of insulin, macular edema presence unspecified, unspecified laterality, unspecified retinopathy severity ([place])   8. Hyperlipidemia, unspecified hyperlipidemia type   9. Essential hypertension   10. OSA (obstructive sleep apnea)   11. Sarcoidosis ([place])   12. Thyroid disease   13. Medication monitoring encounter   14. Need for prophylactic vaccination and inoculation against influenza   15. Hand dermatitis         Plan:  a1c, cmp, lipid, ua, tsh, cbc- ordered; no pap due to total hyst with BSO Needs flu shot - done today   During the course of the visit the patient was educated and counseled about appropriate screening and preventive services including:       Zostavax?? Script was given -   See Health Maintenance tab  Last pap 10/02/13 w/o HPV screen done, nml. Repeat today and again at 20. Then none further if normal.  Mammogram done at [facility] 03/2016 so due 03/2017   Colonoscopy done 07/[facility] at [facility] by Dr. Henrene Pastor - due for 10 years before repeat. Also had an EGD at that time that was also  normal- these reuslts are in the chart.   Immunization History  Administered Date(s) Administered  . Influenza,inj,Quad PF,36+ Mos 10/02/2013, 01/11/2015, 01/20/2016  . Pneumococcal Polysaccharide-23 10/02/2013  . Tdap 01/11/2015      Orders Placed This Encounter  Procedures  . Flu Vaccine QUAD 36+ mos IM  . Hemoglobin A1c  . Comprehensive metabolic panel    Order Specific Question:   Has the patient fasted?    Answer:   Yes  . CBC  . Lipid panel    Order Specific Question:   Has the patient fasted?    Answer:   Yes  . TSH  . POCT urinalysis dipstick    Meds ordered this encounter  Medications  . Zoster Vaccine Live, PF, (ZOSTAVAX) 84166 UNT/0.65ML injection    Sig: Inject 19,400 Units into the skin once.    Dispense:  1 vial    Refill:  0  . triamcinolone cream (KENALOG) 0.1 %    Sig: Apply 1 application topically 2 (two) times daily.    Dispense:  80 g    Refill:  0     [name], M.D.  Primary Care at [facility] [address] [place], [place] [address] [phone] phone [phone] fax  01/30/17 10:50 PM   Diet review  for nutrition referral? Yes ____  Not Indicated ____   Patient Instructions (the written plan) was given to the patient.  Medicare Attestation I have personally reviewed: The patient's medical and social history Their use of alcohol, tobacco or illicit drugs Their current medications and supplements The patient's functional ability including ADLs,fall risks, home safety risks, cognitive, and hearing and visual impairment Diet and physical activities Evidence for depression or mood disorders  The patient's weight, height, BMI, and visual acuity have been recorded in the chart.  I have made referrals, counseling, and provided education to the patient based on review of the above and I have provided the patient with a written personalized care plan for preventive services.     [name], MD   01/10/2017

## 2017-01-11 ENCOUNTER — Encounter: Payer: Self-pay | Admitting: Family Medicine

## 2017-01-11 ENCOUNTER — Ambulatory Visit (INDEPENDENT_AMBULATORY_CARE_PROVIDER_SITE_OTHER): Payer: Medicare HMO | Admitting: Family Medicine

## 2017-01-11 ENCOUNTER — Telehealth: Payer: Self-pay

## 2017-01-11 VITALS — BP 133/75 | HR 72 | Temp 98.3°F | Resp 18 | Ht 63.0 in | Wt 219.0 lb

## 2017-01-11 DIAGNOSIS — D869 Sarcoidosis, unspecified: Secondary | ICD-10-CM | POA: Diagnosis not present

## 2017-01-11 DIAGNOSIS — Z1231 Encounter for screening mammogram for malignant neoplasm of breast: Secondary | ICD-10-CM | POA: Diagnosis not present

## 2017-01-11 DIAGNOSIS — E079 Disorder of thyroid, unspecified: Secondary | ICD-10-CM

## 2017-01-11 DIAGNOSIS — G4733 Obstructive sleep apnea (adult) (pediatric): Secondary | ICD-10-CM

## 2017-01-11 DIAGNOSIS — I10 Essential (primary) hypertension: Secondary | ICD-10-CM

## 2017-01-11 DIAGNOSIS — E11319 Type 2 diabetes mellitus with unspecified diabetic retinopathy without macular edema: Secondary | ICD-10-CM

## 2017-01-11 DIAGNOSIS — Z1389 Encounter for screening for other disorder: Secondary | ICD-10-CM | POA: Diagnosis not present

## 2017-01-11 DIAGNOSIS — Z0000 Encounter for general adult medical examination without abnormal findings: Secondary | ICD-10-CM | POA: Diagnosis not present

## 2017-01-11 DIAGNOSIS — Z1211 Encounter for screening for malignant neoplasm of colon: Secondary | ICD-10-CM | POA: Diagnosis not present

## 2017-01-11 DIAGNOSIS — E785 Hyperlipidemia, unspecified: Secondary | ICD-10-CM | POA: Diagnosis not present

## 2017-01-11 DIAGNOSIS — Z136 Encounter for screening for cardiovascular disorders: Secondary | ICD-10-CM

## 2017-01-11 DIAGNOSIS — Z23 Encounter for immunization: Secondary | ICD-10-CM | POA: Diagnosis not present

## 2017-01-11 DIAGNOSIS — Z1383 Encounter for screening for respiratory disorder NEC: Secondary | ICD-10-CM | POA: Diagnosis not present

## 2017-01-11 DIAGNOSIS — Z124 Encounter for screening for malignant neoplasm of cervix: Secondary | ICD-10-CM | POA: Diagnosis not present

## 2017-01-11 DIAGNOSIS — Z113 Encounter for screening for infections with a predominantly sexual mode of transmission: Secondary | ICD-10-CM | POA: Diagnosis not present

## 2017-01-11 DIAGNOSIS — Z5181 Encounter for therapeutic drug level monitoring: Secondary | ICD-10-CM

## 2017-01-11 DIAGNOSIS — Z1212 Encounter for screening for malignant neoplasm of rectum: Secondary | ICD-10-CM

## 2017-01-11 DIAGNOSIS — L309 Dermatitis, unspecified: Secondary | ICD-10-CM

## 2017-01-11 LAB — POCT URINALYSIS DIP (MANUAL ENTRY)
BILIRUBIN UA: NEGATIVE
Bilirubin, UA: NEGATIVE
Blood, UA: NEGATIVE
GLUCOSE UA: NEGATIVE
Nitrite, UA: NEGATIVE
Protein Ur, POC: NEGATIVE
SPEC GRAV UA: 1.02
Urobilinogen, UA: 0.2
pH, UA: 6

## 2017-01-11 MED ORDER — ZOSTER VACCINE LIVE 19400 UNT/0.65ML ~~LOC~~ SUSR: 0.6500 mL | Freq: Once | SUBCUTANEOUS | 0 refills | Status: AC

## 2017-01-11 MED ORDER — TRIAMCINOLONE ACETONIDE 0.1 % EX CREA
1.0000 | TOPICAL_CREAM | Freq: Two times a day (BID) | CUTANEOUS | 0 refills | Status: DC
Start: 2017-01-11 — End: 2017-07-12

## 2017-01-11 NOTE — Telephone Encounter (Signed)
Pt states that the cortizone was not sent to the pharmacy   Best number [phone]

## 2017-01-11 NOTE — Patient Instructions (Addendum)
IF you received an x-ray today, you will receive an invoice from [facility]. Please contact [facility] at [phone] with questions or concerns regarding your invoice.   IF you received labwork today, you will receive an invoice from [facility]. Please contact [facility] at [phone] with questions or concerns regarding your invoice.   Our billing staff will not be able to assist you with questions regarding bills from these companies.  You will be contacted with the lab results as soon as they are available. The fastest way to get your results is to activate your My Chart account. Instructions are located on the last page of this paperwork. If you have not heard from us regarding the results in 2 weeks, please contact this office.      [name] , Thank you for taking time to come for your Medicare Wellness Visit. I appreciate your ongoing commitment to your health goals. Please review the following plan we discussed and let me know if I can assist you in the future.   These are the goals we discussed: Goals    None      This is a list of the screening recommended for you and due dates:  Health Maintenance  Topic Date Due  . Shingles Vaccine  05/05/2013  . Flu Shot  07/11/2016  . Pap Smear  10/02/2016  . Hemoglobin A1C  12/30/2016  . Eye exam for diabetics  04/10/2017  . Complete foot exam   06/29/2017  . Mammogram  03/11/2018  . Pneumococcal vaccine (2) 10/02/2018  . Colon Cancer Screening  07/27/2021  . Tetanus Vaccine  01/11/2025  .  Hepatitis C: One time screening is recommended by Center for Disease Control  (CDC) for  adults born from [birth year]1945 through 1963.   Completed  . HIV Screening  Completed   Exercising to Stay Healthy Introduction Exercising regularly is important. It has many health benefits, such as:  Improving your overall fitness, flexibility, and endurance.  Increasing your bone density.  Helping with weight control.  Decreasing  your body fat.  Increasing your muscle strength.  Reducing stress and tension.  Improving your overall health. In order to become healthy and stay healthy, it is recommended that you do moderate-intensity and vigorous-intensity exercise. You can tell that you are exercising at a moderate intensity if you have a higher heart rate and faster breathing, but you are still able to hold a conversation. You can tell that you are exercising at a vigorous intensity if you are breathing much harder and faster and cannot hold a conversation while exercising. How often should I exercise? Choose an activity that you enjoy and set realistic goals. Your health care provider can help you to make an activity plan that works for you. Exercise regularly as directed by your health care provider. This may include:  Doing resistance training twice each week, such as:  Push-ups.  Sit-ups.  Lifting weights.  Using resistance bands.  Doing a given intensity of exercise for a given amount of time. Choose from these options:  150 minutes of moderate-intensity exercise every week.  75 minutes of vigorous-intensity exercise every week.  A mix of moderate-intensity and vigorous-intensity exercise every week. Children, pregnant women, people who are out of shape, people who are overweight, and older adults may need to consult a health care provider for individual recommendations. If you have any sort of medical condition, be sure to consult your health care provider before starting a new exercise  program. What are some exercise ideas? Some moderate-intensity exercise ideas include:  Walking at a rate of 1 mile in 15 minutes.  Biking.  Hiking.  Golfing.  Dancing. Some vigorous-intensity exercise ideas include:  Walking at a rate of at least 4.5 miles per hour.  Jogging or running at a rate of 5 miles per hour.  Biking at a rate of at least 10 miles per hour.  Lap swimming.  Roller-skating or  in-line skating.  Cross-country skiing.  Vigorous competitive sports, such as football, basketball, and soccer.  Jumping rope.  Aerobic dancing. What are some everyday activities that can help me to get exercise?  Yard work, such as:  Child psychotherapist.  Raking and bagging leaves.  Washing and waxing your car.  Pushing a stroller.  Shoveling snow.  Gardening.  Washing windows or floors. How can I be more active in my day-to-day activities?  Use the stairs instead of the elevator.  Take a walk during your lunch break.  If you drive, park your car farther away from work or school.  If you take public transportation, get off one stop early and walk the rest of the way.  Make all of your phone calls while standing up and walking around.  Get up, stretch, and walk around every 30 minutes throughout the day. What guidelines should I follow while exercising?  Do not exercise so much that you hurt yourself, feel dizzy, or get very short of breath.  Consult your health care provider before starting a new exercise program.  Wear comfortable clothes and shoes with good support.  Drink plenty of water while you exercise to prevent dehydration or heat stroke. Body water is lost during exercise and must be replaced.  Work out until you breathe faster and your heart beats faster. This information is not intended to replace advice given to you by your health care provider. Make sure you discuss any questions you have with your health care provider. Document Released: 12/30/2010 Document Revised: 05/04/2016 Document Reviewed: 04/30/2014  2017 Elsevier

## 2017-01-12 NOTE — Telephone Encounter (Signed)
I had sent a rx for triamcinolone to her pharmacy - see med list. It is stronger than otc cotisone but the same type of cream.

## 2017-01-12 NOTE — Telephone Encounter (Signed)
Pt advised.

## 2017-01-12 NOTE — Telephone Encounter (Signed)
I tried to review note, cortisone rx?

## 2017-01-13 LAB — COMPREHENSIVE METABOLIC PANEL
A/G RATIO: 1.1 — AB (ref 1.2–2.2)
ALT: 14 IU/L (ref 0–32)
AST: 14 IU/L (ref 0–40)
Albumin: 4.1 g/dL (ref 3.6–4.8)
Alkaline Phosphatase: 92 IU/L (ref 39–117)
BUN/Creatinine Ratio: 15 (ref 12–28)
BUN: 15 mg/dL (ref 8–27)
Bilirubin Total: 0.3 mg/dL (ref 0.0–1.2)
CO2: 28 mmol/L (ref 18–29)
Calcium: 9.4 mg/dL (ref 8.7–10.3)
Chloride: 99 mmol/L (ref 96–106)
Creatinine, Ser: 1.02 mg/dL — ABNORMAL HIGH (ref 0.57–1.00)
GFR calc Af Amer: 68 mL/min/{1.73_m2} (ref >59)
GFR, EST NON AFRICAN AMERICAN: 59 mL/min/{1.73_m2} — AB (ref >59)
GLOBULIN, TOTAL: 3.8 g/dL (ref 1.5–4.5)
Glucose: 76 mg/dL (ref 65–99)
POTASSIUM: 4 mmol/L (ref 3.5–5.2)
SODIUM: 141 mmol/L (ref 134–144)
Total Protein: 7.9 g/dL (ref 6.0–8.5)

## 2017-01-13 LAB — LIPID PANEL
CHOL/HDL RATIO: 4.5 ratio — AB (ref 0.0–4.4)
Cholesterol, Total: 195 mg/dL (ref 100–199)
HDL: 43 mg/dL (ref >39)
LDL Calculated: 129 mg/dL — ABNORMAL HIGH (ref 0–99)
Triglycerides: 115 mg/dL (ref 0–149)
VLDL Cholesterol Cal: 23 mg/dL (ref 5–40)

## 2017-01-13 LAB — CBC
HEMATOCRIT: 36.5 % (ref 34.0–46.6)
Hemoglobin: 11 g/dL — ABNORMAL LOW (ref 11.1–15.9)
MCH: 21.4 pg — AB (ref 26.6–33.0)
MCHC: 30.1 g/dL — AB (ref 31.5–35.7)
MCV: 71 fL — AB (ref 79–97)
Platelets: 315 10*3/uL (ref 150–379)
RBC: 5.14 x10E6/uL (ref 3.77–5.28)
RDW: 16.8 % — AB (ref 12.3–15.4)
WBC: 8.6 10*3/uL (ref 3.4–10.8)

## 2017-01-13 LAB — TSH: TSH: 1.03 u[IU]/mL (ref 0.450–4.500)

## 2017-01-13 LAB — HEMOGLOBIN A1C
Est. average glucose Bld gHb Est-mCnc: 134 mg/dL
HEMOGLOBIN A1C: 6.3 % — AB (ref 4.8–5.6)

## 2017-01-15 DIAGNOSIS — R69 Illness, unspecified: Secondary | ICD-10-CM | POA: Diagnosis not present

## 2017-03-14 ENCOUNTER — Other Ambulatory Visit: Payer: Self-pay | Admitting: Family Medicine

## 2017-03-14 DIAGNOSIS — I10 Essential (primary) hypertension: Secondary | ICD-10-CM

## 2017-03-15 NOTE — Telephone Encounter (Signed)
BP Readings from Last 3 Encounters:  01/11/17 133/75  06/29/16 130/82  01/29/16 122/76

## 2017-03-19 ENCOUNTER — Other Ambulatory Visit: Payer: Self-pay | Admitting: Family Medicine

## 2017-03-19 DIAGNOSIS — Z1231 Encounter for screening mammogram for malignant neoplasm of breast: Secondary | ICD-10-CM

## 2017-03-24 ENCOUNTER — Ambulatory Visit (INDEPENDENT_AMBULATORY_CARE_PROVIDER_SITE_OTHER): Payer: Medicare HMO

## 2017-03-24 ENCOUNTER — Ambulatory Visit: Payer: Medicare HMO

## 2017-03-24 ENCOUNTER — Ambulatory Visit (INDEPENDENT_AMBULATORY_CARE_PROVIDER_SITE_OTHER): Payer: Medicare HMO | Admitting: Family Medicine

## 2017-03-24 VITALS — BP 150/74 | HR 62 | Temp 98.2°F | Resp 18 | Ht 63.0 in | Wt 219.6 lb

## 2017-03-24 DIAGNOSIS — I10 Essential (primary) hypertension: Secondary | ICD-10-CM

## 2017-03-24 DIAGNOSIS — N644 Mastodynia: Secondary | ICD-10-CM

## 2017-03-24 DIAGNOSIS — N182 Chronic kidney disease, stage 2 (mild): Secondary | ICD-10-CM

## 2017-03-24 DIAGNOSIS — E119 Type 2 diabetes mellitus without complications: Secondary | ICD-10-CM | POA: Diagnosis not present

## 2017-03-24 DIAGNOSIS — E1122 Type 2 diabetes mellitus with diabetic chronic kidney disease: Secondary | ICD-10-CM

## 2017-03-24 DIAGNOSIS — M79621 Pain in right upper arm: Secondary | ICD-10-CM | POA: Diagnosis not present

## 2017-03-24 DIAGNOSIS — M19011 Primary osteoarthritis, right shoulder: Secondary | ICD-10-CM | POA: Diagnosis not present

## 2017-03-24 DIAGNOSIS — E785 Hyperlipidemia, unspecified: Secondary | ICD-10-CM | POA: Diagnosis not present

## 2017-03-24 MED ORDER — MELOXICAM 7.5 MG PO TABS: 7.5000 mg | ORAL_TABLET | Freq: Every day | ORAL | 0 refills | Status: DC

## 2017-03-24 MED ORDER — METFORMIN HCL 500 MG PO TABS: ORAL_TABLET | ORAL | 1 refills | Status: DC

## 2017-03-24 MED ORDER — ROSUVASTATIN CALCIUM 40 MG PO TABS: 40.0000 mg | ORAL_TABLET | Freq: Every day | ORAL | 1 refills | Status: DC

## 2017-03-24 NOTE — Patient Instructions (Addendum)
Your cholesterol was much better but not yet at goal so we are going to change your lipitor (atorvastatin) to the stronger medicine crestor (rosuvastatin).  Follow-up in 4-6 months to repeat FASTING labs. Continue on your same diabetes and blood pressure medicines. Continue to check your blood pressure at home and call so we can increase your medicine if it is >140/85 have the time.  Do not use any other otc pain medication other than tylenol/acetaminophen - so no aleve, ibuprofen, motrin, advil, etc.  Take the meloxicam once a day. If you are still having any pain in 1 month, please come back in for further evaluation. Ice the area of pain.  Meds ordered this encounter  Medications  . rosuvastatin (CRESTOR) 40 MG tablet    Sig: Take 1 tablet (40 mg total) by mouth daily.    Dispense:  90 tablet    Refill:  1  . meloxicam (MOBIC) 7.5 MG tablet    Sig: Take 1 tablet (7.5 mg total) by mouth daily.    Dispense:  30 tablet    Refill:  0  . metFORMIN (GLUCOPHAGE) 500 MG tablet    Sig: TAKE 1 TABLET (500 MG TOTAL) BY MOUTH 2 (TWO) TIMES DAILY WITH A MEAL.    Dispense:  180 tablet    Refill:  1       IF you received an x-ray today, you will receive an invoice from [facility]. Please contact [facility] at [phone] with questions or concerns regarding your invoice.   IF you received labwork today, you will receive an invoice from [place]. Please contact [facility] at [phone] with questions or concerns regarding your invoice.   Our billing staff will not be able to assist you with questions regarding bills from these companies.  You will be contacted with the lab results as soon as they are available. The fastest way to get your results is to activate your My Chart account. Instructions are located on the last page of this paperwork. If you have not heard from [place] regarding the results in 2 weeks, please contact this office.

## 2017-03-24 NOTE — Progress Notes (Signed)
By signing my name below, I, [name], attest that this documentation has been prepared under the direction and in the presence of [name], MD.  Electronically Signed: [name], Medical Scribe. 03/24/17. 10:24 AM.  Subjective:    Patient ID: Eileen Jefferson, female    DOB: Jul 12, 1951, 66 y.o.   MRN: [id number]  HPI Chief Complaint  Patient presents with  . Breast Pain    right breast and under armpit and shoulder x aweek     HPI Comments: Eileen Jefferson is a 66 y.o. female who presents to the Primary Care at [facility] and [facility] complaining of non radiating right breast shoulder and axilla pain onset 1 week ago. X-ray of her neck in ER in 2005 that showed DDD and C5-6 and C6-7 cervical spondylosis. [name] last mammogram was at [facility] 03/13/16 and nl.  Pain worsens with using her extremities. Pt used extra strength tylenol for her sxs with little relief of her sxs. She has a mammogram scheduled on 04/05/17. [name] blood sugar was 130 this morning, and it usually runs 130-180. Pt has not been using her CPAP more. Denies past injuries or problems to her shoulder. Denies neck pain, and back pain.  Patient Active Problem List   Diagnosis Date Noted  . Hyperlipidemia 06/28/2016  . Thyroid disease 06/28/2016  . Diabetic retinopathy ([place]) 05/05/2015  . Obesity 01/11/2015  . Glaucoma 04/06/2014  . Macular degeneration 04/06/2014  . 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 HTN (hypertension) 10/02/2013  . Sarcoidosis 10/02/2013  . Diabetes mellitus, type 2 ([place]) 10/02/2013  . OSA (obstructive sleep apnea) 10/23/2012   Past Medical History:  Diagnosis Date  . Diabetes mellitus without complication ([place])    per patient health survery-borderline  . Diverticulosis   . HTN (hypertension)   . Hyperlipidemia   . Iron deficiency anemia   . Pulmonary sarcoidosis ([place])   . Sleep apnea   . Thyroid disease    Past Surgical History:  Procedure Laterality  Date  . APPENDECTOMY    . c-section     x 2  . CATARACT EXTRACTION     bilateral  . SCLERAL BUCKLE     right  . TONSILLECTOMY    . TOTAL ABDOMINAL HYSTERECTOMY    . TUBAL LIGATION    . VITRECTOMY     bilateral   Allergies  Allergen Reactions  . Contrast Media [Iodinated Diagnostic Agents]   . Iohexol Other (See Comments)    " made me feel like I was burning inside"   Prior to Admission medications   Medication Sig Start Date End Date Taking? Authorizing Provider  ACCU-CHEK SOFTCLIX LANCETS lancets Test blood sugar once daily. Dx: E11.9 10/25/16  Yes Shawnee Knapp, MD  Alcohol Swabs PADS Test blood sugar once daily. Dx: E11.9 10/25/16  Yes Shawnee Knapp, MD  atorvastatin (LIPITOR) 80 MG tablet Take 1 tablet (80 mg total) by mouth daily at 6 PM. 10/25/16  Yes Shawnee Knapp, MD  atorvastatin (LIPITOR) 80 MG tablet TAKE 1 TABLET BY MOUTH EVERY DAY AT 6PM 01/05/17  Yes Chelle Jeffery, PA-C  Blood Glucose Calibration (ACCU-CHEK AVIVA) SOLN Test blood sugar once daily. Dx: E11.9 10/25/16  Yes Shawnee Knapp, MD  Blood Glucose Monitoring Suppl (ACCU-CHEK AVIVA PLUS) w/Device KIT Test blood sugar once daily. Dx: E11.9 10/25/16  Yes Shawnee Knapp, MD  brimonidine-timolol (COMBIGAN) 0.2-0.5 % ophthalmic solution Place 1 drop into the left eye 2 (two)  times daily.     Yes Historical Provider, MD  brinzolamide (AZOPT) 1 % ophthalmic suspension Place 1 drop into the left eye 2 (two) times daily.     Yes Historical Provider, MD  glucose blood (ACCU-CHEK AVIVA PLUS) test strip Test blood sugar once daily. Dx: E11.9 10/25/16  Yes Shawnee Knapp, MD  lisinopril-hydrochlorothiazide (PRINZIDE,ZESTORETIC) 10-12.5 MG tablet TAKE 1 TABLET EVERY DAY 03/15/17  Yes Jaynee Eagles, PA-C  metFORMIN (GLUCOPHAGE) 500 MG tablet TAKE 1 TABLET (500 MG TOTAL) BY MOUTH 2 (TWO) TIMES DAILY WITH A MEAL. 10/25/16  Yes Shawnee Knapp, MD  triamcinolone cream (KENALOG) 0.1 % Apply 1 application topically 2 (two) times daily. 01/11/17  Yes Shawnee Knapp, MD     Social History   Social History  . Marital status: Married    Spouse name: N/A  . Number of children: 2  . Years of education: N/A   Occupational History  . behavioral health    Social History Main Topics  . Smoking status: Former Smoker    Packs/day: 0.20    Years: 1.00    Types: Cigarettes    Quit date: 12/11/1968  . Smokeless tobacco: Never Used  . Alcohol use No  . Drug use: No  . Sexual activity: Not on file   Other Topics Concern  . Not on file   Social History Narrative  . No narrative on file   Review of Systems  Musculoskeletal: Positive for arthralgias and myalgias (axilla, breast tissue, and shoulder). Negative for back pain and neck pain.  see hpi Objective:  Physical Exam  Constitutional: She appears well-developed and well-nourished. No distress.  HENT:  Head: Normocephalic and atraumatic.  Eyes: Conjunctivae are normal.  Neck: Neck supple.  Cardiovascular: Normal rate.   Pulmonary/Chest: Effort normal.  TTP approx 2 o'clock 1-2 cm outside of areola No abnormalities in breast tissue noted Question of increased right axiallary adenopethy No erythema or warmth  Musculoskeletal:  No tenderness over the cervicalspinusprocess or upper thoracic No pain over transitional vertebrae No tenderness over the clavicle or acromion No tenderness over the spinus scapula, paraspinal muscles FROM Arms at 90 degrees Nl strength for internal and external ROM Negative empty can test Negative Neers and Hawkins  Neurological: She is alert.  Skin: Skin is warm and dry.  Hyperpigmented macular rash extending over the left breast, measuring 10 cm in height x 18 cm in length  Psychiatric: She has a normal mood and affect. Her behavior is normal.  Nursing note and vitals reviewed.   Vitals:   03/24/17 0940 03/24/17 1003  BP: (!) 162/82 (!) 148/72  Pulse: 62   Resp: 18   Temp: 98.2 F (36.8 C)   TempSrc: Oral   SpO2: 97%   Weight: 219 lb 9.6 oz (99.6 kg)    Height: _0  (1.6 m)   Body mass index is 38.9 kg/m.   Dg Chest 2 View  Result Date: 03/24/2017 CLINICAL DATA:  Right breast pain, right axilla pain EXAM: CHEST  2 VIEW COMPARISON:  06/05/2007 FINDINGS: Cardiomediastinal silhouette is stable. Mild mid thoracic dextroscoliosis. Mild degenerative changes mid and lower thoracic spine. No infiltrate or pulmonary edema. IMPRESSION: No active cardiopulmonary disease. Mild mid thoracic dextroscoliosis. Degenerative changes mid and lower thoracic spine. Electronically Signed   By: Lahoma Crocker M.D.   On: 03/24/2017 10:58   Dg Shoulder Right  Result Date: 03/24/2017 CLINICAL DATA:  Right axillary pain, right breast pain EXAM: RIGHT SHOULDER - 2+ VIEW  COMPARISON:  None. FINDINGS: Three views of the right shoulder submitted. No acute fracture or subluxation. Mild degenerative changes AC joint. Glenohumeral joint is preserved. Mild spurring of distal clavicle. IMPRESSION: No acute fracture or subluxation. Degenerative changes AC joint. Mild spurring of distal clavicle. Electronically Signed   By: Lahoma Crocker M.D.   On: 03/24/2017 11:00   Assessment & Plan:   1. Axillary pain, right   2. Breast pain, right   3. Essential hypertension, benign   4. Hyperlipidemia, unspecified hyperlipidemia type -   5. Type 2 diabetes mellitus with stage 2 chronic kidney disease, without long-term current use of insulin ([place])   6. Diabetes mellitus type 2 without retinopathy ([place])     Orders Placed This Encounter  Procedures  . DG Shoulder Right    Standing Status:   Future    Number of Occurrences:   1    Standing Expiration Date:   03/24/2018    Order Specific Question:   Reason for Exam (SYMPTOM  OR DIAGNOSIS REQUIRED)    Answer:   right upper chest/breast pain radiating into right axilla and shoulder    Order Specific Question:   Preferred imaging location?    Answer:   External  . DG Chest 2 View    Standing Status:   Future    Number of Occurrences:   1     Standing Expiration Date:   03/24/2018    Order Specific Question:   Reason for Exam (SYMPTOM  OR DIAGNOSIS REQUIRED)    Answer:   right upper chest/breast pain radiating into right axilla and shoulder    Order Specific Question:   Preferred imaging location?    Answer:   External  . MM DIAG BREAST TOMO BILATERAL    Standing Status:   Future    Number of Occurrences:   1    Standing Expiration Date:   05/24/2018    Scheduling Instructions:     Pt had done at [facility] prior so might be beast to send back there - has appt for screening mammogram later this month so please try to change appt to diagnostic mammo with [place] due to development of pain sxs    Order Specific Question:   Reason for Exam (SYMPTOM  OR DIAGNOSIS REQUIRED)    Answer:   right breast pain and ttp at 1-2 oclock 2 cm from areaola x 1 wk, worsening and radiating into right axilla    Order Specific Question:   Preferred imaging location?    Answer:   External  . US BREAST LTD UNI RIGHT INC AXILLA    Standing Status:   Future    Number of Occurrences:   1    Standing Expiration Date:   06/05/2018    Order Specific Question:   Reason for Exam (SYMPTOM  OR DIAGNOSIS REQUIRED)    Answer:   right breast pain and ttp at 1-2 oclock 2 cm from areaola x 1 wk, worsening and radiating into right axilla    Order Specific Question:   Preferred imaging location?    Answer:   External  . Microalbumin/Creatinine Ratio, Urine  . Care order/instruction:    Scheduling Instructions:     Recheck BP  . Care order/instruction:    Scheduling Instructions:     Complete orders, AVS and go.    Meds ordered this encounter  Medications  . rosuvastatin (CRESTOR) 40 MG tablet    Sig: Take 1 tablet (40 mg total) by mouth daily.  Dispense:  90 tablet    Refill:  1  . meloxicam (MOBIC) 7.5 MG tablet    Sig: Take 1 tablet (7.5 mg total) by mouth daily.    Dispense:  30 tablet    Refill:  0  . metFORMIN (GLUCOPHAGE) 500 MG tablet    Sig: TAKE 1  TABLET (500 MG TOTAL) BY MOUTH 2 (TWO) TIMES DAILY WITH A MEAL.    Dispense:  180 tablet    Refill:  1    I personally performed the services described in this documentation, which was scribed in my presence. The recorded information has been reviewed and considered, and addended by me as needed.   [name], M.D.  Primary Care at [facility] [address] [place], [place] [address] [phone] phone [phone] fax  04/06/17 9:24 AM

## 2017-03-26 LAB — MICROALBUMIN / CREATININE URINE RATIO
Creatinine, Urine: 264.2 mg/dL
MICROALBUM., U, RANDOM: 3.5 ug/mL
Microalb/Creat Ratio: 1.3 mg/g creat (ref 0.0–30.0)

## 2017-04-05 ENCOUNTER — Ambulatory Visit
Admission: RE | Admit: 2017-04-05 | Discharge: 2017-04-05 | Disposition: A | Discharge status: Home / Self Care | Payer: Medicare HMO | Source: Ambulatory Visit | Attending: Family Medicine | Admitting: Family Medicine

## 2017-04-05 DIAGNOSIS — N644 Mastodynia: Secondary | ICD-10-CM

## 2017-04-05 DIAGNOSIS — R928 Other abnormal and inconclusive findings on diagnostic imaging of breast: Secondary | ICD-10-CM | POA: Diagnosis not present

## 2017-04-05 DIAGNOSIS — Z1231 Encounter for screening mammogram for malignant neoplasm of breast: Secondary | ICD-10-CM

## 2017-04-05 DIAGNOSIS — M79621 Pain in right upper arm: Secondary | ICD-10-CM

## 2017-04-05 DIAGNOSIS — N6489 Other specified disorders of breast: Secondary | ICD-10-CM | POA: Diagnosis not present

## 2017-04-12 DIAGNOSIS — H401132 Primary open-angle glaucoma, bilateral, moderate stage: Secondary | ICD-10-CM | POA: Diagnosis not present

## 2017-04-12 DIAGNOSIS — H47233 Glaucomatous optic atrophy, bilateral: Secondary | ICD-10-CM | POA: Diagnosis not present

## 2017-07-11 DIAGNOSIS — D649 Anemia, unspecified: Secondary | ICD-10-CM | POA: Insufficient documentation

## 2017-07-11 DIAGNOSIS — M19011 Primary osteoarthritis, right shoulder: Secondary | ICD-10-CM | POA: Insufficient documentation

## 2017-07-11 DIAGNOSIS — M5030 Other cervical disc degeneration, unspecified cervical region: Secondary | ICD-10-CM

## 2017-07-11 HISTORY — DX: Other cervical disc degeneration, unspecified cervical region: M50.30

## 2017-07-11 NOTE — Progress Notes (Signed)
Subjective:    Patient ID: Eileen Jefferson, female    DOB: 06/08/1951, 66 y.o.   MRN: [id number]  Chief Complaint  Patient presents with  . Diabetes  . Follow-up    HPI DMII: On metformin 500 bid. Fasting CBGS 89-129 with avg about low 100s and rarely checks during day. No hypoglycemic episodes. Microalb nml 03/2017. Taking asa 81 qd. Lab Results  Component Value Date   HGBA1C 6.3 (H) 01/11/2017   HGBA1C 6.5 06/29/2016   HGBA1C 6.6 (H) 01/20/2016  Her optho is Dr. Venetia Maxon who she sees every 3-4 mos for glaucoma and DM retinopathy   HTN:  On lisinopril-hctz 10-12.5. Does check BP at home occ stays 110-120s/70s. None >140.  Sig elev at last visit which was attributed to acute flair of c-spine radiculopathy so requested pt to monitor BP outside the office to ensure decreased. No hypotensive sxs.  CKD: still on metformin as last 3 GFR have all been > 60 Lab Results  Component Value Date   CREATININE 1.02 (H) 01/11/2017   CREATININE 1.11 (H) 06/29/2016   CREATININE 1.10 (H) 01/20/2016    HLD: Pt changed from lipitor 80 to Crestor 40 3 mos prior (03/2017) as lipids not at goal.  She is tolerating the crestor well.  Lab Results  Component Value Date   LDLCALC 129 (H) 01/11/2017   LDLCALC 175 (H) 06/29/2016   LDLCALC 161 (H) 01/20/2016   Obesity: BMI 39  OSA:  Using CPAP now??? Long-standing poor/partial compliance - she doesn't know why. Is going to try to improve compliance.   Cervical DDD with C5-6 and C6-7 spondylosis: Tried Meloxicam 7.5 qd but sxs improved and no longer needing this. Rt shoulder OA:   Sarcoidosis: Mainly in eyes, no current pulm problems  Thyroid disease: Was hypothyroid but it spontaneous recovered. Does have a goiter. Lab Results  Component Value Date   TSH 1.030 01/11/2017   Anemia: Hgb has ben stable at 11 for the past 2 years - nml ferritin. 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 No vit or supp. CBC Latest Ref Rng & Units 01/11/2017 06/29/2016 01/20/2016  WBC  3.4 - 10.8 x10E3/uL 8.6 7.4 8.5  Hemoglobin 11.1 - 15.9 g/dL 11.0(L) 11.1(L) 11.1(L)  Hematocrit 34.0 - 46.6 % 36.5 37.0 37.0  Platelets 150 - 379 x10E3/uL 315 368 349   Past Medical History:  Diagnosis Date  . Diabetes mellitus without complication ([place])    per patient health survery-borderline  . Diverticulosis   . HTN (hypertension)   . Hyperlipidemia   . Iron deficiency anemia   . Pulmonary sarcoidosis ([place])   . Sleep apnea   . Thyroid disease    Past Surgical History:  Procedure Laterality Date  . APPENDECTOMY    . c-section     x 2  . CATARACT EXTRACTION     bilateral  . SCLERAL BUCKLE     right  . TONSILLECTOMY    . TOTAL ABDOMINAL HYSTERECTOMY    . TUBAL LIGATION    . VITRECTOMY     bilateral   Current Outpatient Prescriptions on File Prior to Visit  Medication Sig Dispense Refill  . ACCU-CHEK SOFTCLIX LANCETS lancets Test blood sugar once daily. Dx: E11.9 100 each 2  . Alcohol Swabs PADS Test blood sugar once daily. Dx: E11.9 100 each 2  . Blood Glucose Calibration (ACCU-CHEK AVIVA) SOLN Test blood sugar once daily. Dx: E11.9 1 each 2  . Blood Glucose Monitoring Suppl (ACCU-CHEK AVIVA PLUS) w/Device KIT Test blood sugar  once daily. Dx: E11.9 1 kit 0  . brimonidine-timolol (COMBIGAN) 0.2-0.5 % ophthalmic solution Place 1 drop into the left eye 2 (two) times daily.      . brinzolamide (AZOPT) 1 % ophthalmic suspension Place 1 drop into the left eye 2 (two) times daily.      Marland Kitchen glucose blood (ACCU-CHEK AVIVA PLUS) test strip Test blood sugar once daily. Dx: E11.9 100 each 2  . lisinopril-hydrochlorothiazide (PRINZIDE,ZESTORETIC) 10-12.5 MG tablet TAKE 1 TABLET EVERY DAY 90 tablet 2   No current facility-administered medications on file prior to visit.    Allergies  Allergen Reactions  . Contrast Media [Iodinated Diagnostic Agents]   . Iohexol Other (See Comments)    " made me feel like I was burning inside"   Family History  Problem Relation Age of Onset  .  Colon polyps Mother        brothers x2  . Clotting disorder Mother        brother x 2, MGM  . Heart disease Brother        mother, MGM  . Diabetes Brother        x 2, MGM  . Stroke Maternal Grandmother   . Colon cancer Neg Hx    Social History   Social History  . Marital status: Married    Spouse name: N/A  . Number of children: 2  . Years of education: N/A   Occupational History  . behavioral health    Social History Main Topics  . Smoking status: Former Smoker    Packs/day: 0.20    Years: 1.00    Types: Cigarettes    Quit date: 12/11/1968  . Smokeless tobacco: Never Used  . Alcohol use No  . Drug use: No  . Sexual activity: Not Asked   Other Topics Concern  . None   Social History Narrative  . None   Depression screen [facility] 2/9 07/12/2017 03/24/2017 01/11/2017 06/29/2016 01/20/2016  Decreased Interest 0 0 0 0 0  Down, Depressed, Hopeless 0 0 0 0 0  PHQ - 2 Score 0 0 0 0 0     Review of Systems  Constitutional: Negative for chills and fever.  Respiratory: Negative for cough and shortness of breath.   Cardiovascular: Negative for chest pain.  Gastrointestinal: Negative for diarrhea, nausea and vomiting.  Genitourinary: Negative for dysuria.  Musculoskeletal: Negative for arthralgias, back pain, gait problem, joint swelling, neck pain and neck stiffness.  Skin: Negative for rash.  Neurological: Negative for headaches.   See hpi    Objective:   Physical Exam  Constitutional: She is oriented to person, place, and time. She appears well-developed and well-nourished. No distress.  HENT:  Head: Normocephalic and atraumatic.  Right Ear: External ear normal.  Left Ear: External ear normal.  Eyes: Conjunctivae are normal. No scleral icterus.  Neck: Normal range of motion. Neck supple. No thyromegaly present.  Cardiovascular: Normal rate, regular rhythm, normal heart sounds and intact distal pulses.   Pulmonary/Chest: Effort normal and breath sounds normal. No  respiratory distress.  Musculoskeletal: She exhibits no edema.  Lymphadenopathy:    She has no cervical adenopathy.  Neurological: She is alert and oriented to person, place, and time.  Skin: Skin is warm and dry. She is not diaphoretic. No erythema.  Psychiatric: She has a normal mood and affect. Her behavior is normal.   Diabetic Foot Exam - Simple   Simple Foot Form Diabetic Foot exam was performed with the following findings:  Yes 07/12/2017  9:51 AM  Visual Inspection No deformities, no ulcerations, no other skin breakdown bilaterally:  Yes Sensation Testing Intact to touch and monofilament testing bilaterally:  Yes Pulse Check Posterior Tibialis and Dorsalis pulse intact bilaterally:  Yes Comments        BP 108/72   Pulse 92   Temp 97.9 F (36.6 C) (Oral)   Resp 18   Ht 5' 3"  (1.6 m)   Wt 215 lb 12.8 oz (97.9 kg)   SpO2 99%   BMI 38.23 kg/m  Results for orders placed or performed in visit on 07/12/17  POCT glycosylated hemoglobin (Hb A1C)  Result Value Ref Range   Hemoglobin A1C 6.4     Assessment & Plan:   Pap done here 10/02/13 w/o HPV testing. Pt is s/p total hys w/ BSO for benign indication so no further paps needed.  1. Type 2 diabetes mellitus with retinopathy, without long-term current use of insulin, macular edema presence unspecified, unspecified laterality, unspecified retinopathy severity ([place]) - doing great, cont metformin 500 bid  2. Essential hypertension - doing great  3. Pure hypercholesterolemia - has only been 3 mos since statin change and now maxed out at crestor 40 so not much place to go if not at goal  4. Class 2 severe obesity due to excess calories with serious comorbidity and body mass index (BMI) of 39.0 to 39.9 in adult [facility]) - encouraged to use cpap to help with daytime metabolism - last 5 lbs since wellness visit last yr - keep on the trend  5. OSA (obstructive sleep apnea)   6. Medication monitoring encounter   7. DDD (degenerative  disc disease), cervical - no current problems  8. Localized osteoarthrosis of right shoulder region   9. Anemia, unspecified type   10. Diabetes mellitus type 2 without retinopathy ([place])     Orders Placed This Encounter  Procedures  . Comprehensive metabolic panel  . Lipid panel    Order Specific Question:   Has the patient fasted?    Answer:   Yes  . POCT glycosylated hemoglobin (Hb A1C)    Meds ordered this encounter  Medications  . metFORMIN (GLUCOPHAGE) 500 MG tablet    Sig: TAKE 1 TABLET (500 MG TOTAL) BY MOUTH 2 (TWO) TIMES DAILY WITH A MEAL.    Dispense:  180 tablet    Refill:  1    Please add refills to current rx.  . rosuvastatin (CRESTOR) 40 MG tablet    Sig: Take 1 tablet (40 mg total) by mouth daily.    Dispense:  90 tablet    Refill:  1    PLease add refills to current rx     [name], M.D.  Primary Care at [facility] [address] [place], [place] [address] [phone] phone [phone] fax  07/12/17 9:49 AM

## 2017-07-12 ENCOUNTER — Encounter: Payer: Self-pay | Admitting: Family Medicine

## 2017-07-12 ENCOUNTER — Ambulatory Visit (INDEPENDENT_AMBULATORY_CARE_PROVIDER_SITE_OTHER): Payer: Medicare HMO | Admitting: Family Medicine

## 2017-07-12 VITALS — BP 108/72 | HR 92 | Temp 97.9°F | Resp 18 | Ht 63.0 in | Wt 215.8 lb

## 2017-07-12 DIAGNOSIS — M19011 Primary osteoarthritis, right shoulder: Secondary | ICD-10-CM

## 2017-07-12 DIAGNOSIS — E119 Type 2 diabetes mellitus without complications: Secondary | ICD-10-CM

## 2017-07-12 DIAGNOSIS — M5030 Other cervical disc degeneration, unspecified cervical region: Secondary | ICD-10-CM | POA: Diagnosis not present

## 2017-07-12 DIAGNOSIS — E11319 Type 2 diabetes mellitus with unspecified diabetic retinopathy without macular edema: Secondary | ICD-10-CM | POA: Diagnosis not present

## 2017-07-12 DIAGNOSIS — G4733 Obstructive sleep apnea (adult) (pediatric): Secondary | ICD-10-CM

## 2017-07-12 DIAGNOSIS — Z6839 Body mass index (BMI) 39.0-39.9, adult: Secondary | ICD-10-CM

## 2017-07-12 DIAGNOSIS — I10 Essential (primary) hypertension: Secondary | ICD-10-CM

## 2017-07-12 DIAGNOSIS — D649 Anemia, unspecified: Secondary | ICD-10-CM | POA: Diagnosis not present

## 2017-07-12 DIAGNOSIS — Z5181 Encounter for therapeutic drug level monitoring: Secondary | ICD-10-CM

## 2017-07-12 DIAGNOSIS — E7800 Pure hypercholesterolemia, unspecified: Secondary | ICD-10-CM

## 2017-07-12 LAB — POCT GLYCOSYLATED HEMOGLOBIN (HGB A1C): Hemoglobin A1C: 6.4

## 2017-07-12 LAB — COMPREHENSIVE METABOLIC PANEL
ALK PHOS: 91 IU/L (ref 39–117)
ALT: 24 IU/L (ref 0–32)
AST: 23 IU/L (ref 0–40)
Albumin/Globulin Ratio: 1.3 (ref 1.2–2.2)
Albumin: 4.4 g/dL (ref 3.6–4.8)
BUN/Creatinine Ratio: 20 (ref 12–28)
BUN: 25 mg/dL (ref 8–27)
Bilirubin Total: <0.2 mg/dL (ref 0.0–1.2)
CALCIUM: 9.6 mg/dL (ref 8.7–10.3)
CO2: 26 mmol/L (ref 20–29)
CREATININE: 1.28 mg/dL — AB (ref 0.57–1.00)
Chloride: 101 mmol/L (ref 96–106)
GFR calc Af Amer: 51 mL/min/{1.73_m2} — ABNORMAL LOW (ref >59)
GFR, EST NON AFRICAN AMERICAN: 44 mL/min/{1.73_m2} — AB (ref >59)
GLOBULIN, TOTAL: 3.4 g/dL (ref 1.5–4.5)
GLUCOSE: 96 mg/dL (ref 65–99)
Potassium: 4.6 mmol/L (ref 3.5–5.2)
SODIUM: 141 mmol/L (ref 134–144)
TOTAL PROTEIN: 7.8 g/dL (ref 6.0–8.5)

## 2017-07-12 LAB — LIPID PANEL
CHOL/HDL RATIO: 4 ratio (ref 0.0–4.4)
CHOLESTEROL TOTAL: 200 mg/dL — AB (ref 100–199)
HDL: 50 mg/dL (ref >39)
LDL CALC: 131 mg/dL — AB (ref 0–99)
Triglycerides: 94 mg/dL (ref 0–149)
VLDL CHOLESTEROL CAL: 19 mg/dL (ref 5–40)

## 2017-07-12 MED ORDER — ROSUVASTATIN CALCIUM 40 MG PO TABS: 40.0000 mg | ORAL_TABLET | Freq: Every day | ORAL | 1 refills | Status: DC

## 2017-07-12 MED ORDER — METFORMIN HCL 500 MG PO TABS: ORAL_TABLET | ORAL | 1 refills | Status: DC

## 2017-07-12 NOTE — Patient Instructions (Addendum)
IF you received an x-ray today, you will receive an invoice from [facility]. Please contact [facility] at [phone] with questions or concerns regarding your invoice.   IF you received labwork today, you will receive an invoice from [facility]. Please contact [facility] at [phone] with questions or concerns regarding your invoice.   Our billing staff will not be able to assist you with questions regarding bills from these companies.  You will be contacted with the lab results as soon as they are available. The fastest way to get your results is to activate your My Chart account. Instructions are located on the last page of this paperwork. If you have not heard from [place] regarding the results in 2 weeks, please contact this office.     Diet for Metabolic Syndrome Metabolic syndrome is a disorder that includes at least three of these conditions:  Abdominal obesity.  Too much sugar in your blood.  High blood pressure.  Higher than normal amount of fat (lipids) in your blood.  Lower than normal level of "good" cholesterol (HDL).  Following a healthy diet can help to keep metabolic syndrome under control. It can also help to prevent the development of conditions that are associated with metabolic syndrome, such as diabetes, heart disease, and stroke. Along with exercise, a healthy diet:  Helps to improve the way that the body uses insulin.  Promotes weight loss. A common goal for people with this condition is to lose at least 7 to 10 percent of their starting weight.  What do I need to know about this diet?  Use the glycemic index (GI) to plan your meals. The index tells you how quickly a food will raise your blood sugar. Choose foods that have low GI values. These foods take a longer time to raise blood sugar.  Keep track of how many calories you take in. Eating the right amount of calories will help your achieve a healthy weight.  You may want to follow a  Mediterranean diet. This diet includes lots of vegetables, lean meats or fish, whole grains, fruits, and healthy oils and fats. What foods can I eat? Grains Stone-ground whole wheat. Pumpernickel bread. Whole-grain bread, crackers, tortillas, cereal, and pasta. Unsweetened oatmeal.Bulgur.Barley.Quinoa.Brown rice or wild rice. Vegetables Lettuce. Spinach. Peas. Beets. Cauliflower. Cabbage. Broccoli. Carrots. Tomatoes. Squash. Eggplant. Herbs. Peppers. Onions. Cucumbers. Brussels sprouts. Sweet potatoes. Yams. Beans. Lentils. Fruits Berries. Apples. Oranges. Grapes. Mango. Pomegranate. Kiwi. Cherries. Meats and Other Protein Sources Seafood and shellfish. Lean meats.Poultry. Tofu. Dairy Low-fat or fat-free dairy products, such as milk, yogurt, and cheese. Beverages Water. Low-fat milk. Milk alternatives, like soy milk or almond milk. Real fruit juice. Condiments Low-sugar or sugar-free ketchup, barbecue sauce, and mayonnaise. Mustard. Relish. Fats and Oils Avocado. Canola or olive oil. Nuts and nut butters.Seeds. The items listed above may not be a complete list of recommended foods or beverages. Contact your dietitian for more options. What foods are not recommended? Red meat. Palm oil and coconut oil. Processed foods. Fried foods. Alcohol. Sweetened drinks, such as iced tea and soda. Sweets. Salty foods. The items listed above may not be a complete list of foods and beverages to avoid. Contact your dietitian for more information. This information is not intended to replace advice given to you by your health care provider. Make sure you discuss any questions you have with your health care provider. Document Released: 04/13/2015 Document Revised: 04/07/2016 Document Reviewed: 12/09/2014 Elsevier Interactive Patient Education  2018 [employer].

## 2017-07-16 ENCOUNTER — Telehealth: Payer: Self-pay | Admitting: Family Medicine

## 2017-07-16 NOTE — Telephone Encounter (Signed)
Pt was calling back about labs.  She states if she is not able to pick up it is ok to leave a detail VM.

## 2017-08-16 DIAGNOSIS — H401132 Primary open-angle glaucoma, bilateral, moderate stage: Secondary | ICD-10-CM | POA: Diagnosis not present

## 2017-09-13 DIAGNOSIS — E113593 Type 2 diabetes mellitus with proliferative diabetic retinopathy without macular edema, bilateral: Secondary | ICD-10-CM | POA: Diagnosis not present

## 2017-09-13 DIAGNOSIS — H47293 Other optic atrophy, bilateral: Secondary | ICD-10-CM | POA: Diagnosis not present

## 2018-01-14 ENCOUNTER — Encounter: Payer: Medicare HMO | Admitting: Family Medicine

## 2018-01-15 ENCOUNTER — Ambulatory Visit: Payer: Medicare HMO

## 2018-01-17 ENCOUNTER — Ambulatory Visit: Payer: Medicare HMO

## 2018-01-17 ENCOUNTER — Telehealth: Payer: Self-pay

## 2018-01-17 NOTE — Telephone Encounter (Signed)
Called patient to reschedule Medicare AWV- left message on voicemail to return call

## 2018-01-21 ENCOUNTER — Encounter: Payer: Medicare HMO | Admitting: Family Medicine

## 2018-02-13 ENCOUNTER — Encounter: Payer: Self-pay | Admitting: Family Medicine

## 2018-02-13 ENCOUNTER — Ambulatory Visit (INDEPENDENT_AMBULATORY_CARE_PROVIDER_SITE_OTHER): Payer: Medicare HMO | Admitting: Family Medicine

## 2018-02-13 ENCOUNTER — Other Ambulatory Visit: Payer: Self-pay

## 2018-02-13 VITALS — BP 128/78 | HR 71 | Temp 98.0°F | Resp 18 | Ht 63.0 in | Wt 218.2 lb

## 2018-02-13 DIAGNOSIS — D869 Sarcoidosis, unspecified: Secondary | ICD-10-CM

## 2018-02-13 DIAGNOSIS — Z113 Encounter for screening for infections with a predominantly sexual mode of transmission: Secondary | ICD-10-CM

## 2018-02-13 DIAGNOSIS — E11319 Type 2 diabetes mellitus with unspecified diabetic retinopathy without macular edema: Secondary | ICD-10-CM | POA: Diagnosis not present

## 2018-02-13 DIAGNOSIS — Z136 Encounter for screening for cardiovascular disorders: Secondary | ICD-10-CM

## 2018-02-13 DIAGNOSIS — G4733 Obstructive sleep apnea (adult) (pediatric): Secondary | ICD-10-CM

## 2018-02-13 DIAGNOSIS — Z124 Encounter for screening for malignant neoplasm of cervix: Secondary | ICD-10-CM | POA: Diagnosis not present

## 2018-02-13 DIAGNOSIS — Z5181 Encounter for therapeutic drug level monitoring: Secondary | ICD-10-CM | POA: Diagnosis not present

## 2018-02-13 DIAGNOSIS — Z1389 Encounter for screening for other disorder: Secondary | ICD-10-CM | POA: Diagnosis not present

## 2018-02-13 DIAGNOSIS — Z1231 Encounter for screening mammogram for malignant neoplasm of breast: Secondary | ICD-10-CM | POA: Diagnosis not present

## 2018-02-13 DIAGNOSIS — Z130 Encounter for screening for diseases of the blood and blood-forming organs and certain disorders involving the immune mechanism: Secondary | ICD-10-CM | POA: Diagnosis not present

## 2018-02-13 DIAGNOSIS — E7800 Pure hypercholesterolemia, unspecified: Secondary | ICD-10-CM

## 2018-02-13 DIAGNOSIS — Z0000 Encounter for general adult medical examination without abnormal findings: Secondary | ICD-10-CM | POA: Diagnosis not present

## 2018-02-13 DIAGNOSIS — I10 Essential (primary) hypertension: Secondary | ICD-10-CM

## 2018-02-13 DIAGNOSIS — D539 Nutritional anemia, unspecified: Secondary | ICD-10-CM

## 2018-02-13 DIAGNOSIS — Z23 Encounter for immunization: Secondary | ICD-10-CM

## 2018-02-13 DIAGNOSIS — Z1211 Encounter for screening for malignant neoplasm of colon: Secondary | ICD-10-CM | POA: Diagnosis not present

## 2018-02-13 DIAGNOSIS — E079 Disorder of thyroid, unspecified: Secondary | ICD-10-CM | POA: Diagnosis not present

## 2018-02-13 DIAGNOSIS — Z1329 Encounter for screening for other suspected endocrine disorder: Secondary | ICD-10-CM

## 2018-02-13 DIAGNOSIS — Z79899 Other long term (current) drug therapy: Secondary | ICD-10-CM

## 2018-02-13 DIAGNOSIS — Z1212 Encounter for screening for malignant neoplasm of rectum: Secondary | ICD-10-CM

## 2018-02-13 DIAGNOSIS — E2839 Other primary ovarian failure: Secondary | ICD-10-CM

## 2018-02-13 DIAGNOSIS — H6123 Impacted cerumen, bilateral: Secondary | ICD-10-CM | POA: Diagnosis not present

## 2018-02-13 DIAGNOSIS — D649 Anemia, unspecified: Secondary | ICD-10-CM

## 2018-02-13 DIAGNOSIS — Z6838 Body mass index (BMI) 38.0-38.9, adult: Secondary | ICD-10-CM

## 2018-02-13 DIAGNOSIS — Z1383 Encounter for screening for respiratory disorder NEC: Secondary | ICD-10-CM

## 2018-02-13 DIAGNOSIS — Z6839 Body mass index (BMI) 39.0-39.9, adult: Secondary | ICD-10-CM

## 2018-02-13 DIAGNOSIS — E119 Type 2 diabetes mellitus without complications: Secondary | ICD-10-CM

## 2018-02-13 LAB — POCT URINALYSIS DIP (MANUAL ENTRY)
BILIRUBIN UA: NEGATIVE
BILIRUBIN UA: NEGATIVE mg/dL
GLUCOSE UA: NEGATIVE mg/dL
Leukocytes, UA: NEGATIVE
Nitrite, UA: NEGATIVE
Protein Ur, POC: NEGATIVE mg/dL
RBC UA: NEGATIVE
SPEC GRAV UA: 1.025 (ref 1.010–1.025)
Urobilinogen, UA: 0.2 E.U./dL
pH, UA: 5.5 (ref 5.0–8.0)

## 2018-02-13 LAB — POCT GLYCOSYLATED HEMOGLOBIN (HGB A1C): Hemoglobin A1C: 6.5

## 2018-02-13 NOTE — Progress Notes (Addendum)
Subjective:  By signing my name below, I, Moises Blood, attest that this documentation has been prepared under the direction and in the presence of [name], MD. Electronically Signed: Moises Blood, [place]. 02/13/2018 , 10:34 AM .  Patient was seen in Room 1 .   Patient ID: Eileen Jefferson, female    DOB: [birth year], 67 y.o.   MRN: [id number] Chief Complaint  Patient presents with  . Annual Exam    No pap needed.   HPI  Primary Preventative Screenings: Cervical Cancer: no pap, s/p hysterectomy in 1999, due to fibroids. She denies vaginal bleeding, vaginal discharge or pelvic pain.  STI screening: negative Hep C and HIV in 2017. Declines need for repeat tesitng Breast Cancer: mammogram last done 03/2017 at the breast center.  Colorectal Cancer: colonoscopy (Dr. Henrene Pastor) done in 2012, repeat in 10 years.  Tobacco use/EtOH/substances: Bone Density: Cardiac: last EKG in 2014.  Weight/Blood sugar/Diet/Exercise: She checks blood sugars in the mornings, running around 104-113, sometimes <100. She walks up and down stairs throughout the day, taking care of her 68 month old granddaughter.  BMI Readings from Last 3 Encounters:  02/13/18 38.65 kg/m  07/12/17 38.23 kg/m  03/24/17 38.90 kg/m   Lab Results  Component Value Date   HGBA1C 6.5 02/13/2018   OTC/Vit/Supp/Herbal: She takes aspirin 60m. She doesn't take Calcium or Vitamin D supplements, does drink milk with cereal about 3-4 times a week. She also eats yogurt daily, and enjoys ice cream. She eats fish, leafy greens and vegetables. She doesn't eat much fry foods.  Dentist/Optho: eyes are stable, about the same; saw eye doctor in Feb earlier this year, and has another appointment in about 3 months. She sees dentist every 6 months, has appointment on 03/09/18.  Immunizations:  Immunization History  Administered Date(s) Administered  . Influenza,inj,Quad PF,6+ Mos 10/02/2013, 01/11/2015, 01/20/2016, 01/11/2017  . Pneumococcal  Polysaccharide-23 10/02/2013  . Tdap 01/11/2015    Chronic Medical Conditions: Anemia: ferritin was normal 3 years ago, microcytic, no prior B-12 or direct iron. Mother had a history of anemia. No family history of sickle cell. She hasn't taken any iron supplements recently.  Hyperlipidemia: she's doing well on Crestor.  HTN: she states her BP is running normal, around 120s/70s.  OSA: she reports not using her CPAP every night because she doesn't like the mask. She was fitted for it several years ago.   Past Medical History:  Diagnosis Date  . Diabetes mellitus without complication ([place]    per patient health survery-borderline  . Diverticulosis   . HTN (hypertension)   . Hyperlipidemia   . Iron deficiency anemia   . Pulmonary sarcoidosis ([facility]   . Sleep apnea   . Thyroid disease    Past Surgical History:  Procedure Laterality Date  . APPENDECTOMY    . c-section     x 2  . CATARACT EXTRACTION     bilateral  . SCLERAL BUCKLE     right  . TONSILLECTOMY    . TOTAL ABDOMINAL HYSTERECTOMY    . TUBAL LIGATION    . VITRECTOMY     bilateral   Current Outpatient Medications on File Prior to Visit  Medication Sig Dispense Refill  . ACCU-CHEK SOFTCLIX LANCETS lancets Test blood sugar once daily. Dx: E11.9 100 each 2  . Alcohol Swabs PADS Test blood sugar once daily. Dx: E11.9 100 each 2  . Blood Glucose Calibration (ACCU-CHEK AVIVA) SOLN Test blood sugar once daily. Dx: E11.9 1 each 2  .  Blood Glucose Monitoring Suppl (ACCU-CHEK AVIVA PLUS) w/Device KIT Test blood sugar once daily. Dx: E11.9 1 kit 0  . brimonidine-timolol (COMBIGAN) 0.2-0.5 % ophthalmic solution Place 1 drop into the left eye 2 (two) times daily.      . brinzolamide (AZOPT) 1 % ophthalmic suspension Place 1 drop into the left eye 2 (two) times daily.      Marland Kitchen glucose blood (ACCU-CHEK AVIVA PLUS) test strip Test blood sugar once daily. Dx: E11.9 100 each 2  . lisinopril-hydrochlorothiazide (PRINZIDE,ZESTORETIC)  10-12.5 MG tablet TAKE 1 TABLET EVERY DAY 90 tablet 2  . metFORMIN (GLUCOPHAGE) 500 MG tablet TAKE 1 TABLET (500 MG TOTAL) BY MOUTH 2 (TWO) TIMES DAILY WITH A MEAL. 180 tablet 1  . rosuvastatin (CRESTOR) 40 MG tablet Take 1 tablet (40 mg total) by mouth daily. 90 tablet 1   No current facility-administered medications on file prior to visit.    Allergies  Allergen Reactions  . Contrast Media [Iodinated Diagnostic Agents]   . Iohexol Other (See Comments)    " made me feel like I was burning inside"   Family History  Problem Relation Age of Onset  . Colon polyps Mother        brothers x2  . Clotting disorder Mother        brother x 2, MGM  . Heart disease Brother        mother, MGM  . Diabetes Brother        x 2, MGM  . Stroke Maternal Grandmother   . Colon cancer Neg Hx    Social History   Socioeconomic History  . Marital status: Married    Spouse name: None  . Number of children: 2  . Years of education: None  . Highest education level: None  Social Needs  . Financial resource strain: None  . Food insecurity - worry: None  . Food insecurity - inability: None  . Transportation needs - medical: None  . Transportation needs - non-medical: None  Occupational History  . Occupation: behavioral health  Tobacco Use  . Smoking status: Former Smoker    Packs/day: 0.20    Years: 1.00    Pack years: 0.20    Types: Cigarettes    Last attempt to quit: 12/11/1968    Years since quitting: 49.2  . Smokeless tobacco: Never Used  Substance and Sexual Activity  . Alcohol use: No  . Drug use: No  . Sexual activity: None  Other Topics Concern  . None  Social History Narrative  . None   Depression screen [facility] 2/9 02/13/2018 07/12/2017 03/24/2017 01/11/2017 06/29/2016  Decreased Interest 0 0 0 0 0  Down, Depressed, Hopeless 0 0 0 0 0  PHQ - 2 Score 0 0 0 0 0     Review of Systems  Constitutional: Negative for chills, fatigue, fever and unexpected weight change.  Respiratory: Negative  for cough.   Gastrointestinal: Negative for constipation, diarrhea, nausea and vomiting.  Skin: Negative for rash and wound.  Neurological: Negative for dizziness, weakness and headaches.       Objective:   Physical Exam  Constitutional: She is oriented to person, place, and time. She appears well-developed and well-nourished. No distress.  HENT:  Head: Normocephalic and atraumatic.  Right Ear: Ear canal normal. Tympanic membrane is injected.  Left Ear: Ear canal normal. Tympanic membrane is injected.  (11:36 AM) cerumen removed   Eyes: EOM are normal. Pupils are equal, round, and reactive to light.  Neck: Neck  supple.  Cardiovascular: Normal rate.  Pulmonary/Chest: Effort normal. No respiratory distress.  Musculoskeletal: Normal range of motion.  Neurological: She is alert and oriented to person, place, and time.  Skin: Skin is warm and dry.  Psychiatric: She has a normal mood and affect. Her behavior is normal.  Nursing note and vitals reviewed.   BP 128/78 (BP Location: Left Arm, Patient Position: Sitting, Cuff Size: Large)   Pulse 71   Temp 98 F (36.7 C) (Oral)   Resp 18   Ht 5' 3"  (1.6 m)   Wt 218 lb 3.2 oz (99 kg)   SpO2 100%   BMI 38.65 kg/m   Results for orders placed or performed in visit on 02/13/18  POCT glycosylated hemoglobin (Hb A1C)  Result Value Ref Range   Hemoglobin A1C 6.5   POCT urinalysis dipstick  Result Value Ref Range   Color, UA yellow yellow   Clarity, UA clear clear   Glucose, UA negative negative mg/dL   Bilirubin, UA negative negative   Ketones, POC UA negative negative mg/dL   Spec Grav, UA 1.025 1.010 - 1.025   Blood, UA negative negative   pH, UA 5.5 5.0 - 8.0   Protein Ur, POC negative negative mg/dL   Urobilinogen, UA 0.2 0.2 or 1.0 E.U./dL   Nitrite, UA Negative Negative   Leukocytes, UA Negative Negative   EKG: NSR, no acute ischemic changes noted. No significant change noted when compared to prior EKG done 10/02/2013.   I  have personally reviewed the EKG tracing and agree with the computer interpretation.      Assessment & Plan:   1. Annual physical exam - will be due for initial DEXA bone scan in 2 mos as will have had 67 yo b-day - so will have her sched dexa at same time as mammogram in next 1-2 mos. Will be due to prevnar-13 at next OV.  2. Encounter for screening mammogram for breast cancer - will be due for repeat screening mammogram in 1 mo at [facility]  3. Screening for cardiovascular, respiratory, and genitourinary diseases - EKG today     5. Screening for colorectal cancer - repeat w/ Dr. Henrene Pastor in 2022  6. Screening for deficiency anemia         9. Essential hypertension - well controlled here and outside office on lisinopril-hctz 10-12.5  10. Type 2 diabetes mellitus with retinopathy, without long-term current use of insulin, macular edema presence unspecified, unspecified laterality, unspecified retinopathy severity ([place]) - on metformin 500 bid - may need to bump up dose soon - recheck in 6 mos after working on tlc. Lab Results  Component Value Date   HGBA1C 6.5 02/13/2018   HGBA1C 6.4 07/12/2017   HGBA1C 6.3 (H) 01/11/2017   Seeing ophtho q 36mo.  11. OSA (obstructive sleep apnea) - non-compliant with CPAP mask as uncomfortable so encouraged pt to reach out to rxing physician and/or DME co to see if there is any other product avail for her to try instead - reviewed sxs and irreversable complications/outcomes from long-standing untreated OSA inc heart failure, pulm HTN, decreased mental abilities/memory/attention, etc  12. Thyroid disease   13. Sarcoidosis   14. Class 2 severe obesity due to excess calories with serious comorbidity and body mass index (BMI) of 39.0 to 39.9 in adult (HCC) - weight stable > 165yr15. Pure hypercholesterolemia - goal LDL <100 - finally at goal today - first time - continue crestor 40.  16. Anemia,  unspecified type - very gradually worsening, unknown etiology  - pt always assumed iron deficient - mother also w/ chronic mild anemia - pt never thought to question etiology. Not taking any iron or other vit supp and no regular/episodic blood loss.  Check iron panel, b12, folate, etc.  ADDNEDUM: initial anemia w/u labs nml - nml iron and B levels.  At next OV rec check peripheral smear, hgb electrophoresis, and reticulocytes  17. Medication monitoring encounter   18. Flu vaccine need - given today  19. Bilateral hearing loss due to cerumen impaction - s/p lavage by CMA today, successful and pt tolerated well. TMs appear nml after, no comp   F/u in 6 mos for review of chronic medical conditions.  Orders Placed This Encounter  Procedures  . Flu Vaccine QUAD 36+ mos IM  . Lipid panel    Order Specific Question:   Has the patient fasted?    Answer:   Yes  . Comprehensive metabolic panel    Order Specific Question:   Has the patient fasted?    Answer:   Yes  . Microalbumin/Creatinine Ratio, Urine  . CBC  . Ferritin  . Iron and TIBC([facility])  . Vitamin B12  . Ear wax removal  . POCT glycosylated hemoglobin (Hb A1C)  . POCT urinalysis dipstick  . EKG 12-Lead    No orders of the defined types were placed in this encounter.   I personally performed the services described in this documentation, which was scribed in my presence. The recorded information has been reviewed and considered, and addended by me as needed.   [name], M.D.  [facility] [address] [place], [place] [address] [phone] phone [phone] fax  02/16/18 8:06 AM

## 2018-02-13 NOTE — Patient Instructions (Addendum)
IF you received an x-ray today, you will receive an invoice from [facility]. Please contact [facility] at [phone] with questions or concerns regarding your invoice.   IF you received labwork today, you will receive an invoice from [facility]. Please contact [facility] at [phone] with questions or concerns regarding your invoice.   Our billing staff will not be able to assist you with questions regarding bills from these companies.  You will be contacted with the lab results as soon as they are available. The fastest way to get your results is to activate your My Chart account. Instructions are located on the last page of this paperwork. If you have not heard from [place] regarding the results in 2 weeks, please contact this office.     Sleep Apnea Sleep apnea is a condition in which breathing pauses or becomes shallow during sleep. Episodes of sleep apnea usually last 10 seconds or longer, and they may occur as many as 20 times an hour. Sleep apnea disrupts your sleep and keeps your body from getting the rest that it needs. This condition can increase your risk of certain health problems, including:  Heart attack.  Stroke.  Obesity.  Diabetes.  Heart failure.  Irregular heartbeat.  There are three kinds of sleep apnea:  Obstructive sleep apnea. This kind is caused by a blocked or collapsed airway.  Central sleep apnea. This kind happens when the part of the brain that controls breathing does not send the correct signals to the muscles that control breathing.  Mixed sleep apnea. This is a combination of obstructive and central sleep apnea.  What are the causes? The most common cause of this condition is a collapsed or blocked airway. An airway can collapse or become blocked if:  Your throat muscles are abnormally relaxed.  Your tongue and tonsils are larger than normal.  You are overweight.  Your airway is smaller than normal.  What increases  the risk? This condition is more likely to develop in people who:  Are overweight.  Smoke.  Have a smaller than normal airway.  Are elderly.  Are female.  Drink alcohol.  Take sedatives or tranquilizers.  Have a family history of sleep apnea.  What are the signs or symptoms? Symptoms of this condition include:  Trouble staying asleep.  Daytime sleepiness and tiredness.  Irritability.  Loud snoring.  Morning headaches.  Trouble concentrating.  Forgetfulness.  Decreased interest in sex.  Unexplained sleepiness.  Mood swings.  Personality changes.  Feelings of depression.  Waking up often during the night to urinate.  Dry mouth.  Sore throat.  How is this diagnosed? This condition may be diagnosed with:  A medical history.  A physical exam.  A series of tests that are done while you are sleeping (sleep study). These tests are usually done in a sleep lab, but they may also be done at home.  How is this treated? Treatment for this condition aims to restore normal breathing and to ease symptoms during sleep. It may involve managing health issues that can affect breathing, such as high blood pressure or obesity. Treatment may include:  Sleeping on your side.  Using a decongestant if you have nasal congestion.  Avoiding the use of depressants, including alcohol, sedatives, and narcotics.  Losing weight if you are overweight.  Making changes to your diet.  Quitting smoking.  Using a device to open your airway while you sleep, such as: ? An oral appliance. This is a custom-made  mouthpiece that shifts your lower jaw forward. ? A continuous positive airway pressure (CPAP) device. This device delivers oxygen to your airway through a mask. ? A nasal expiratory positive airway pressure (EPAP) device. This device has valves that you put into each nostril. ? A bi-level positive airway pressure (BPAP) device. This device delivers oxygen to your airway  through a mask.  Surgery if other treatments do not work. During surgery, excess tissue is removed to create a wider airway.  It is important to get treatment for sleep apnea. Without treatment, this condition can lead to:  High blood pressure.  Coronary artery disease.  (Men) An inability to achieve or maintain an erection (impotence).  Reduced thinking abilities.  Follow these instructions at home:  Make any lifestyle changes that your health care provider recommends.  Eat a healthy, well-balanced diet.  Take over-the-counter and prescription medicines only as told by your health care provider.  Avoid using depressants, including alcohol, sedatives, and narcotics.  Take steps to lose weight if you are overweight.  If you were given a device to open your airway while you sleep, use it only as told by your health care provider.  Do not use any tobacco products, such as cigarettes, chewing tobacco, and e-cigarettes. If you need help quitting, ask your health care provider.  Keep all follow-up visits as told by your health care provider. This is important. Contact a health care provider if:  The device that you received to open your airway during sleep is uncomfortable or does not seem to be working.  Your symptoms do not improve.  Your symptoms get worse. Get help right away if:  You develop chest pain.  You develop shortness of breath.  You develop discomfort in your back, arms, or stomach.  You have trouble speaking.  You have weakness on one side of your body.  You have drooping in your face. These symptoms may represent a serious problem that is an emergency. Do not wait to see if the symptoms will go away. Get medical help right away. Call your local emergency services (911 in the [place] Do not drive yourself to the hospital. This information is not intended to replace advice given to you by your health care provider. Make sure you discuss any questions you  have with your health care provider. Document Released: 11/17/2002 Document Revised: 07/23/2016 Document Reviewed: 09/06/2015 Elsevier Interactive Patient Education  2018 [place].  Calcium Intake Recommendations Calcium is a mineral that affects many functions in the body, including:  Blood clotting.  Blood vessel function.  Nerve impulse conduction.  Hormone secretion.  Muscle contraction.  Bone and teeth functions.  Most of your body's calcium supply is stored in your bones and teeth. When your calcium stores are low, you may be at risk for low bone mass, bone loss, and bone fractures. Consuming enough calcium helps to grow healthy bones and teeth and to prevent breakdown over time. It is very important that you get enough calcium if you are:  A child undergoing rapid growth.  An adolescent girl.  A pre- or post-menopausal woman.  A woman whose menstrual cycle has stopped due to anorexia nervosa or regular intense exercise.  An individual with lactose intolerance or a milk allergy.  A vegetarian.  What is my plan? Try to consume the recommended amount of calcium daily based on your age. Depending on your overall health, your health care provider may recommend increased calcium intake.General daily calcium intake recommendations by age  are:  Birth to 6 months: 200 mg.  Infants 7 to 12 months: 260 mg.  Children 1 to 3 years: 700 mg.  Children 4 to 8 years: 1,000 mg.  Children 9 to 13 years: 1,300 mg.  Teens 14 to 18 years: 1,300 mg.  Adults 19 to 50 years: 1,000 mg.  Adult women 51 to 70 years: 1,200 mg.  Adult men 51 to 70 years: 1,000 mg.  Adults 71 years and older: 1,200 mg.  Pregnant and breastfeeding teens: 1,300 mg.  Pregnant and breastfeeding adults: 1,000 mg.  What do I need to know about calcium intake?  In order for the body to absorb calcium, it needs vitamin D. You can get vitamin D through: ? Direct exposure of the skin to  sunlight. ? Foods, such as egg yolks, liver, saltwater fish, and fortified milk. ? Supplements.  Consuming too much calcium may cause: ? Constipation. ? Decreased absorption of iron and zinc. ? Kidney stones.  Calcium supplements may interact with certain medicines. Check with your health care provider before starting any calcium supplements.  Try to get most of your calcium from food. What foods can I eat? Grains  Fortified oatmeal. Fortified ready-to-eat cereals. Fortified frozen waffles. Vegetables Turnip greens. Broccoli. Fruits Fortified orange juice. Meats and Other Protein Sources Canned sardines with bones. Canned salmon with bones. Soy beans. Tofu. Baked beans. Almonds. [place] nuts. Sunflower seeds. Dairy Milk. Yogurt. Cheese. Cottage cheese. Beverages Fortified soy milk. Fortified rice milk. Sweets/Desserts Pudding. Ice Cream. Milkshakes. Blackstrap molasses. The items listed above may not be a complete list of recommended foods or beverages. Contact your dietitian for more options. What foods can affect my calcium intake? It may be more difficult for your body to use calcium or calcium may leave your body more quickly if you consume large amounts of:  Sodium.  Protein.  Caffeine.  Alcohol.  This information is not intended to replace advice given to you by your health care provider. Make sure you discuss any questions you have with your health care provider. Document Released: 07/11/2004 Document Revised: 06/16/2016 Document Reviewed: 05/05/2014 Elsevier Interactive Patient Education  2018 [place] protect organs, store calcium, and anchor muscles. Good health habits, such as eating nutritious foods and exercising regularly, are important for maintaining healthy bones. They can also help to prevent a condition that causes bones to lose density and become weak and brittle (osteoporosis). Why is bone mass important? Bone mass refers to  the amount of bone tissue that you have. The higher your bone mass, the stronger your bones. An important step toward having healthy bones throughout life is to have strong and dense bones during childhood. A young adult who has a high bone mass is more likely to have a high bone mass later in life. Bone mass at its greatest it is called peak bone mass. A large decline in bone mass occurs in older adults. In women, it occurs about the time of menopause. During this time, it is important to practice good health habits, because if more bone is lost than what is replaced, the bones will become less healthy and more likely to break (fracture). If you find that you have a low bone mass, you may be able to prevent osteoporosis or further bone loss by changing your diet and lifestyle. How can I find out if my bone mass is low? Bone mass can be measured with an X-ray test that is called a bone mineral  density (BMD) test. This test is recommended for all women who are age 74 or older. It may also be recommended for men who are age 78 or older, or for people who are more likely to develop osteoporosis due to:  Having bones that break easily.  Having a long-term disease that weakens bones, such as kidney disease or rheumatoid arthritis.  Having menopause earlier than normal.  Taking medicine that weakens bones, such as steroids, thyroid hormones, or hormone treatment for breast cancer or prostate cancer.  Smoking.  Drinking three or more alcoholic drinks each day.  What are the nutritional recommendations for healthy bones? To have healthy bones, you need to get enough of the right minerals and vitamins. Most nutrition experts recommend getting these nutrients from the foods that you eat. Nutritional recommendations vary from person to person. Ask your health care provider what is healthy for you. Here are some general guidelines. Calcium Recommendations Calcium is the most important (essential) mineral for  bone health. Most people can get enough calcium from their diet, but supplements may be recommended for people who are at risk for osteoporosis. Good sources of calcium include:  Dairy products, such as low-fat or nonfat milk, cheese, and yogurt.  Dark green leafy vegetables, such as bok choy and broccoli.  Calcium-fortified foods, such as orange juice, cereal, bread, soy beverages, and tofu products.  Nuts, such as almonds.  Follow these recommended amounts for daily calcium intake:  Children, age 26?3: 700 mg.  Children, age 71?8: 1,000 mg.  Children, age 261?13: 1,300 mg.  Teens, age 710?18: 1,300 mg.  Adults, age 710?50: 1,000 mg.  Adults, age 65?70: ? Men: 1,000 mg. ? Women: 1,200 mg.  Adults, age 70 or older: 1,200 mg.  Pregnant and breastfeeding females: ? Teens: 1,300 mg. ? Adults: 1,000 mg.  Vitamin D Recommendations Vitamin D is the most essential vitamin for bone health. It helps the body to absorb calcium. Sunlight stimulates the skin to make vitamin D, so be sure to get enough sunlight. If you live in a cold climate or you do not get outside often, your health care provider may recommend that you take vitamin D supplements. Good sources of vitamin D in your diet include:  Egg yolks.  Saltwater fish.  Milk and cereal fortified with vitamin D.  Follow these recommended amounts for daily vitamin D intake:  Children and teens, age 68?18: 71 international units.  Adults, age 80 or younger: 400-800 international units.  Adults, age 58 or older: 800-1,000 international units.  Other Nutrients Other nutrients for bone health include:  Phosphorus. This mineral is found in meat, poultry, dairy foods, nuts, and legumes. The recommended daily intake for adult men and adult women is 700 mg.  Magnesium. This mineral is found in seeds, nuts, dark green vegetables, and legumes. The recommended daily intake for adult men is 400?420 mg. For adult women, it is 310?320  mg.  Vitamin K. This vitamin is found in green leafy vegetables. The recommended daily intake is 120 mg for adult men and 90 mg for adult women.  What type of physical activity is best for building and maintaining healthy bones? Weight-bearing and strength-building activities are important for building and maintaining peak bone mass. Weight-bearing activities cause muscles and bones to work against gravity. Strength-building activities increases muscle strength that supports bones. Weight-bearing and muscle-building activities include:  Walking and hiking.  Jogging and running.  Dancing.  Gym exercises.  Lifting weights.  Tennis and racquetball.  Climbing stairs.  Aerobics.  Adults should get at least 30 minutes of moderate physical activity on most days. Children should get at least 60 minutes of moderate physical activity on most days. Ask your health care provide what type of exercise is best for you. Where can I find more information? For more information, check out the following websites:  [place]: YardHomes.se  [employer] of Health: http://www.niams.AnonymousEar.fr.asp  This information is not intended to replace advice given to you by your health care provider. Make sure you discuss any questions you have with your health care provider. Document Released: 02/17/2004 Document Revised: 06/16/2016 Document Reviewed: 12/02/2014 Elsevier Interactive Patient Education  2018 [place] Maintenance for Postmenopausal Women Menopause is a normal process in which your reproductive ability comes to an end. This process happens gradually over a span of months to years, usually between the ages of 74 and 41. Menopause is complete when you have missed 12 consecutive menstrual periods. It is important to talk with your health care provider about some of the most common conditions that  affect postmenopausal women, such as heart disease, cancer, and bone loss (osteoporosis). Adopting a healthy lifestyle and getting preventive care can help to promote your health and wellness. Those actions can also lower your chances of developing some of these common conditions. What should I know about menopause? During menopause, you may experience a number of symptoms, such as:  Moderate-to-severe hot flashes.  Night sweats.  Decrease in sex drive.  Mood swings.  Headaches.  Tiredness.  Irritability.  Memory problems.  Insomnia.  Choosing to treat or not to treat menopausal changes is an individual decision that you make with your health care provider. What should I know about hormone replacement therapy and supplements? Hormone therapy products are effective for treating symptoms that are associated with menopause, such as hot flashes and night sweats. Hormone replacement carries certain risks, especially as you become older. If you are thinking about using estrogen or estrogen with progestin treatments, discuss the benefits and risks with your health care provider. What should I know about heart disease and stroke? Heart disease, heart attack, and stroke become more likely as you age. This may be due, in part, to the hormonal changes that your body experiences during menopause. These can affect how your body processes dietary fats, triglycerides, and cholesterol. Heart attack and stroke are both medical emergencies. There are many things that you can do to help prevent heart disease and stroke:  Have your blood pressure checked at least every 1-2 years. High blood pressure causes heart disease and increases the risk of stroke.  If you are 43-44 years old, ask your health care provider if you should take aspirin to prevent a heart attack or a stroke.  Do not use any tobacco products, including cigarettes, chewing tobacco, or electronic cigarettes. If you need help quitting, ask  your health care provider.  It is important to eat a healthy diet and maintain a healthy weight. ? Be sure to include plenty of vegetables, fruits, low-fat dairy products, and lean protein. ? Avoid eating foods that are high in solid fats, added sugars, or salt (sodium).  Get regular exercise. This is one of the most important things that you can do for your health. ? Try to exercise for at least 150 minutes each week. The type of exercise that you do should increase your heart rate and make you sweat. This is known as moderate-intensity exercise. ? Try to  do strengthening exercises at least twice each week. Do these in addition to the moderate-intensity exercise.  Know your numbers.Ask your health care provider to check your cholesterol and your blood glucose. Continue to have your blood tested as directed by your health care provider.  What should I know about cancer screening? There are several types of cancer. Take the following steps to reduce your risk and to catch any cancer development as early as possible. Breast Cancer  Practice breast self-awareness. ? This means understanding how your breasts normally appear and feel. ? It also means doing regular breast self-exams. Let your health care provider know about any changes, no matter how small.  If you are 87 or older, have a clinician do a breast exam (clinical breast exam or CBE) every year. Depending on your age, family history, and medical history, it may be recommended that you also have a yearly breast X-ray (mammogram).  If you have a family history of breast cancer, talk with your health care provider about genetic screening.  If you are at high risk for breast cancer, talk with your health care provider about having an MRI and a mammogram every year.  Breast cancer (BRCA) gene test is recommended for women who have family members with BRCA-related cancers. Results of the assessment will determine the need for genetic  counseling and BRCA1 and for BRCA2 testing. BRCA-related cancers include these types: ? Breast. This occurs in males or females. ? Ovarian. ? Tubal. This may also be called fallopian tube cancer. ? Cancer of the abdominal or pelvic lining (peritoneal cancer). ? Prostate. ? Pancreatic.  Cervical, Uterine, and Ovarian Cancer Your health care provider may recommend that you be screened regularly for cancer of the pelvic organs. These include your ovaries, uterus, and vagina. This screening involves a pelvic exam, which includes checking for microscopic changes to the surface of your cervix (Pap test).  For women ages 21-65, health care providers may recommend a pelvic exam and a Pap test every three years. For women ages 23-65, they may recommend the Pap test and pelvic exam, combined with testing for human papilloma virus (HPV), every five years. Some types of HPV increase your risk of cervical cancer. Testing for HPV may also be done on women of any age who have unclear Pap test results.  Other health care providers may not recommend any screening for nonpregnant women who are considered low risk for pelvic cancer and have no symptoms. Ask your health care provider if a screening pelvic exam is right for you.  If you have had past treatment for cervical cancer or a condition that could lead to cancer, you need Pap tests and screening for cancer for at least 20 years after your treatment. If Pap tests have been discontinued for you, your risk factors (such as having a new sexual partner) need to be reassessed to determine if you should start having screenings again. Some women have medical problems that increase the chance of getting cervical cancer. In these cases, your health care provider may recommend that you have screening and Pap tests more often.  If you have a family history of uterine cancer or ovarian cancer, talk with your health care provider about genetic screening.  If you have  vaginal bleeding after reaching menopause, tell your health care provider.  There are currently no reliable tests available to screen for ovarian cancer.  Lung Cancer Lung cancer screening is recommended for adults 40-94 years old who are at high  risk for lung cancer because of a history of smoking. A yearly low-dose CT scan of the lungs is recommended if you:  Currently smoke.  Have a history of at least 30 pack-years of smoking and you currently smoke or have quit within the past 15 years. A pack-year is smoking an average of one pack of cigarettes per day for one year.  Yearly screening should:  Continue until it has been 15 years since you quit.  Stop if you develop a health problem that would prevent you from having lung cancer treatment.  Colorectal Cancer  This type of cancer can be detected and can often be prevented.  Routine colorectal cancer screening usually begins at age 50 and continues through age 74.  If you have risk factors for colon cancer, your health care provider may recommend that you be screened at an earlier age.  If you have a family history of colorectal cancer, talk with your health care provider about genetic screening.  Your health care provider may also recommend using home test kits to check for hidden blood in your stool.  A small camera at the end of a tube can be used to examine your colon directly (sigmoidoscopy or colonoscopy). This is done to check for the earliest forms of colorectal cancer.  Direct examination of the colon should be repeated every 5-10 years until age 65. However, if early forms of precancerous polyps or small growths are found or if you have a family history or genetic risk for colorectal cancer, you may need to be screened more often.  Skin Cancer  Check your skin from head to toe regularly.  Monitor any moles. Be sure to tell your health care provider: ? About any new moles or changes in moles, especially if there is a  change in a mole's shape or color. ? If you have a mole that is larger than the size of a pencil eraser.  If any of your family members has a history of skin cancer, especially at a young age, talk with your health care provider about genetic screening.  Always use sunscreen. Apply sunscreen liberally and repeatedly throughout the day.  Whenever you are outside, protect yourself by wearing long sleeves, pants, a wide-brimmed hat, and sunglasses.  What should I know about osteoporosis? Osteoporosis is a condition in which bone destruction happens more quickly than new bone creation. After menopause, you may be at an increased risk for osteoporosis. To help prevent osteoporosis or the bone fractures that can happen because of osteoporosis, the following is recommended:  If you are 39-12 years old, get at least 1,000 mg of calcium and at least 600 mg of vitamin D per day.  If you are older than age 62 but younger than age 69, get at least 1,200 mg of calcium and at least 600 mg of vitamin D per day.  If you are older than age 62, get at least 1,200 mg of calcium and at least 800 mg of vitamin D per day.  Smoking and excessive alcohol intake increase the risk of osteoporosis. Eat foods that are rich in calcium and vitamin D, and do weight-bearing exercises several times each week as directed by your health care provider. What should I know about how menopause affects my mental health? Depression may occur at any age, but it is more common as you become older. Common symptoms of depression include:  Low or sad mood.  Changes in sleep patterns.  Changes in appetite or  eating patterns.  Feeling an overall lack of motivation or enjoyment of activities that you previously enjoyed.  Frequent crying spells.  Talk with your health care provider if you think that you are experiencing depression. What should I know about immunizations? It is important that you get and maintain your immunizations.  These include:  Tetanus, diphtheria, and pertussis (Tdap) booster vaccine.  Influenza every year before the flu season begins.  Pneumonia vaccine.  Shingles vaccine.  Your health care provider may also recommend other immunizations. This information is not intended to replace advice given to you by your health care provider. Make sure you discuss any questions you have with your health care provider. Document Released: 01/19/2006 Document Revised: 06/16/2016 Document Reviewed: 08/31/2015 Elsevier Interactive Patient Education  2018 [employer].

## 2018-02-14 LAB — CBC
HEMATOCRIT: 35.4 % (ref 34.0–46.6)
Hemoglobin: 10.7 g/dL — ABNORMAL LOW (ref 11.1–15.9)
MCH: 21.7 pg — AB (ref 26.6–33.0)
MCHC: 30.2 g/dL — ABNORMAL LOW (ref 31.5–35.7)
MCV: 72 fL — AB (ref 79–97)
PLATELETS: 376 10*3/uL (ref 150–379)
RBC: 4.92 x10E6/uL (ref 3.77–5.28)
RDW: 16.9 % — AB (ref 12.3–15.4)
WBC: 7.6 10*3/uL (ref 3.4–10.8)

## 2018-02-14 LAB — LIPID PANEL
CHOL/HDL RATIO: 3.5 ratio (ref 0.0–4.4)
CHOLESTEROL TOTAL: 165 mg/dL (ref 100–199)
HDL: 47 mg/dL (ref >39)
LDL Calculated: 97 mg/dL (ref 0–99)
TRIGLYCERIDES: 105 mg/dL (ref 0–149)
VLDL Cholesterol Cal: 21 mg/dL (ref 5–40)

## 2018-02-14 LAB — COMPREHENSIVE METABOLIC PANEL
A/G RATIO: 1.3 (ref 1.2–2.2)
ALT: 21 IU/L (ref 0–32)
AST: 20 IU/L (ref 0–40)
Albumin: 4.5 g/dL (ref 3.6–4.8)
Alkaline Phosphatase: 90 IU/L (ref 39–117)
BILIRUBIN TOTAL: 0.3 mg/dL (ref 0.0–1.2)
BUN / CREAT RATIO: 13 (ref 12–28)
BUN: 13 mg/dL (ref 8–27)
CO2: 26 mmol/L (ref 20–29)
Calcium: 9.8 mg/dL (ref 8.7–10.3)
Chloride: 102 mmol/L (ref 96–106)
Creatinine, Ser: 1.03 mg/dL — ABNORMAL HIGH (ref 0.57–1.00)
GFR calc non Af Amer: 58 mL/min/{1.73_m2} — ABNORMAL LOW (ref >59)
GFR, EST AFRICAN AMERICAN: 66 mL/min/{1.73_m2} (ref >59)
GLOBULIN, TOTAL: 3.4 g/dL (ref 1.5–4.5)
Glucose: 88 mg/dL (ref 65–99)
POTASSIUM: 4.2 mmol/L (ref 3.5–5.2)
SODIUM: 144 mmol/L (ref 134–144)
TOTAL PROTEIN: 7.9 g/dL (ref 6.0–8.5)

## 2018-02-14 LAB — IRON AND TIBC
Iron Saturation: 15 % (ref 15–55)
Iron: 46 ug/dL (ref 27–139)
Total Iron Binding Capacity: 316 ug/dL (ref 250–450)
UIBC: 270 ug/dL (ref 118–369)

## 2018-02-14 LAB — MICROALBUMIN / CREATININE URINE RATIO
Creatinine, Urine: 238.9 mg/dL
MICROALB/CREAT RATIO: 1.9 mg/g{creat} (ref 0.0–30.0)
Microalbumin, Urine: 4.6 ug/mL

## 2018-02-14 LAB — FERRITIN: Ferritin: 73 ng/mL (ref 15–150)

## 2018-02-14 LAB — VITAMIN B12: VITAMIN B 12: 418 pg/mL (ref 232–1245)

## 2018-03-07 ENCOUNTER — Encounter: Payer: Self-pay | Admitting: Family Medicine

## 2018-03-07 ENCOUNTER — Other Ambulatory Visit: Payer: Self-pay | Admitting: Family Medicine

## 2018-03-07 DIAGNOSIS — Z1231 Encounter for screening mammogram for malignant neoplasm of breast: Secondary | ICD-10-CM

## 2018-03-07 MED ORDER — METFORMIN HCL 500 MG PO TABS: ORAL_TABLET | ORAL | 1 refills | Status: DC

## 2018-03-07 MED ORDER — LISINOPRIL-HYDROCHLOROTHIAZIDE 10-12.5 MG PO TABS: 1.0000 | ORAL_TABLET | Freq: Every day | ORAL | 3 refills | Status: DC

## 2018-03-07 MED ORDER — ROSUVASTATIN CALCIUM 40 MG PO TABS: 40.0000 mg | ORAL_TABLET | Freq: Every day | ORAL | 3 refills | Status: DC

## 2018-03-07 NOTE — Addendum Note (Signed)
Addended by: Sherren MochaSHAW, Berna Gitto N on: 03/07/2018 01:44 AM   Modules accepted: Orders

## 2018-04-19 ENCOUNTER — Ambulatory Visit (INDEPENDENT_AMBULATORY_CARE_PROVIDER_SITE_OTHER): Payer: Medicare HMO | Admitting: Physician Assistant

## 2018-04-19 ENCOUNTER — Other Ambulatory Visit: Payer: Self-pay

## 2018-04-19 ENCOUNTER — Encounter: Payer: Self-pay | Admitting: Physician Assistant

## 2018-04-19 VITALS — BP 130/88 | HR 96 | Temp 99.0°F | Resp 16 | Ht 63.0 in | Wt 215.8 lb

## 2018-04-19 DIAGNOSIS — R05 Cough: Secondary | ICD-10-CM | POA: Diagnosis not present

## 2018-04-19 DIAGNOSIS — Z8639 Personal history of other endocrine, nutritional and metabolic disease: Secondary | ICD-10-CM | POA: Diagnosis not present

## 2018-04-19 DIAGNOSIS — R059 Cough, unspecified: Secondary | ICD-10-CM

## 2018-04-19 DIAGNOSIS — R6889 Other general symptoms and signs: Secondary | ICD-10-CM | POA: Diagnosis not present

## 2018-04-19 MED ORDER — AZITHROMYCIN 250 MG PO TABS: ORAL_TABLET | ORAL | 0 refills | Status: AC

## 2018-04-19 NOTE — Patient Instructions (Addendum)
COme back in if you are worsening.  Go to the ED if you start having chest pain, shortness of breath.      IF you received an x-ray today, you will receive an invoice from [facility]. Please contact [facility] at [phone] with questions or concerns regarding your invoice.   IF you received labwork today, you will receive an invoice from [place]. Please contact [facility] at [phone] with questions or concerns regarding your invoice.   Our billing staff will not be able to assist you with questions regarding bills from these companies.  You will be contacted with the lab results as soon as they are available. The fastest way to get your results is to activate your My Chart account. Instructions are located on the last page of this paperwork. If you have not heard from [place] regarding the results in 2 weeks, please contact this office.

## 2018-04-19 NOTE — Progress Notes (Signed)
04/19/2018 9:50 AM   DOB: [birth year] / MRN: [id number]  SUBJECTIVE:  Eileen Jefferson is a 67 y.o. female presenting for cough, fever, nasal congestion, mild sore throat.  Symptoms began with cough 3 days ago.  She feels she is getting worse.  She is tried NyQuil with mild relief of symptoms.  Has a history of diabetes.  Denies shortness of breath or chest pain.  She is allergic to contrast media [iodinated diagnostic agents] and iohexol.   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    She  reports that she quit smoking about 49 years ago. Her smoking use included cigarettes. She has a 0.20 pack-year smoking history. She has never used smokeless tobacco. She reports that she does not drink alcohol or use drugs. She  has no sexual activity history on file. The patient  has a past surgical history that includes c-section; Appendectomy; Tonsillectomy; Total abdominal hysterectomy; Tubal ligation; Cataract extraction; Vitrectomy; and Scleral buckle.  Her family history includes Clotting disorder in her mother; Colon polyps in her mother; Diabetes in her brother; Heart disease in her brother; Stroke in her maternal grandmother.  ROS per HPI  The problem list and medications were reviewed and updated by myself where necessary and exist elsewhere in the encounter.   OBJECTIVE:  BP 130/88 (BP Location: Left Arm, Patient Position: Sitting, Cuff Size: Normal)   Pulse 96   Temp 99 F (37.2 C) (Oral)   Resp 16   Ht  (1.6 m)   Wt 215 lb 12.8 oz (97.9 kg)   SpO2 95%   BMI 38.23 kg/m   Physical Exam  Constitutional: She appears well-developed and  well-nourished. No distress.  Cardiovascular: Regular rhythm, normal heart sounds and intact distal pulses.  Patient with elevated heart rate relative to previous vital signs.  Pulmonary/Chest: Effort normal and breath sounds normal. No stridor. No respiratory distress. She has no wheezes. She has no rales. She exhibits no tenderness.  Skin: She is not diaphoretic.    Lab Results  Component Value Date   WBC 7.6 02/13/2018   HGB 10.7 (L) 02/13/2018   HCT 35.4 02/13/2018   MCV 72 (L) 02/13/2018   PLT 376 02/13/2018    Lab Results  Component Value Date   CREATININE 1.03 (H) 02/13/2018   BUN 13 02/13/2018   NA 144 02/13/2018   K 4.2 02/13/2018   CL 102 02/13/2018   CO2 26 02/13/2018    Lab Results  Component Value Date   ALT 21 02/13/2018   AST 20 02/13/2018   ALKPHOS 90 02/13/2018   BILITOT 0.3 02/13/2018    Lab Results  Component Value Date   TSH 1.030 01/11/2017    Lab Results  Component Value Date   HGBA1C 6.5 02/13/2018    Lab Results  Component Value Date   CHOL 165 02/13/2018   HDL 47 02/13/2018   LDLCALC 97 02/13/2018   TRIG 105 02/13/2018   CHOLHDL 3.5 02/13/2018     No results found for this or any previous visit (from the past 72 hour(s)).  No results found.  ASSESSMENT AND PLAN:  [name] was seen today for uri.  Diagnoses and all orders for this visit:  Cough: Patient's vital signs  mildly high for her.  No significant findings on chest exam.  I suspect she may have an early Community-acquired pneumonia given mild increase in heart rate and sats at 95%.  Patient is certainly nontoxic.  Will start Zithromax today and have advised that she come back or go to the emergency department with any chest pain, shortness of breath, dizziness, diaphoresis. -     azithromycin (ZITHROMAX) 250 MG tablet; Take 2 tabs PO x 1 dose, then 1 tab PO QD x 4 days  Abnormal vital signs  History of diabetes mellitus    The patient is advised to call or return  to clinic if she does not see an improvement in symptoms, or to seek the care of the closest emergency department if she worsens with the above plan.   Deliah Boston, MHS, PA-C Primary Care at [facility] 04/19/2018 9:50 AM

## 2018-04-25 ENCOUNTER — Inpatient Hospital Stay: Admission: RE | Admit: 2018-04-25 | Payer: Medicare HMO | Source: Ambulatory Visit

## 2018-04-25 ENCOUNTER — Ambulatory Visit: Payer: Medicare HMO

## 2018-05-14 DIAGNOSIS — H401132 Primary open-angle glaucoma, bilateral, moderate stage: Secondary | ICD-10-CM | POA: Diagnosis not present

## 2018-06-05 ENCOUNTER — Ambulatory Visit
Admission: RE | Admit: 2018-06-05 | Discharge: 2018-06-05 | Disposition: A | Discharge status: Home / Self Care | Payer: Medicare HMO | Source: Ambulatory Visit | Attending: Family Medicine | Admitting: Family Medicine

## 2018-06-05 DIAGNOSIS — Z1231 Encounter for screening mammogram for malignant neoplasm of breast: Secondary | ICD-10-CM | POA: Diagnosis not present

## 2018-06-05 DIAGNOSIS — E2839 Other primary ovarian failure: Secondary | ICD-10-CM

## 2018-06-05 DIAGNOSIS — Z780 Asymptomatic menopausal state: Secondary | ICD-10-CM | POA: Diagnosis not present

## 2018-06-06 ENCOUNTER — Encounter: Payer: Self-pay | Admitting: Radiology

## 2018-08-13 ENCOUNTER — Telehealth: Payer: Self-pay | Admitting: Family Medicine

## 2018-08-13 NOTE — Telephone Encounter (Signed)
Called pt to inform them of Dr. Alver Fisher leave of absence. Left VM

## 2018-08-14 DIAGNOSIS — H35033 Hypertensive retinopathy, bilateral: Secondary | ICD-10-CM | POA: Diagnosis not present

## 2018-08-14 DIAGNOSIS — H401132 Primary open-angle glaucoma, bilateral, moderate stage: Secondary | ICD-10-CM | POA: Diagnosis not present

## 2018-08-14 DIAGNOSIS — E113311 Type 2 diabetes mellitus with moderate nonproliferative diabetic retinopathy with macular edema, right eye: Secondary | ICD-10-CM | POA: Diagnosis not present

## 2018-08-14 DIAGNOSIS — H47293 Other optic atrophy, bilateral: Secondary | ICD-10-CM | POA: Diagnosis not present

## 2018-08-14 NOTE — Telephone Encounter (Signed)
Patient needs to reschedule their apt on 08/19/18 with a different provider or get an appointment with Dr Shaw when she comes back in Oct. If she only has same day slots please call the clinic and ask for Caitlin so I can open a slot for you. °

## 2018-08-16 ENCOUNTER — Emergency Department (HOSPITAL_COMMUNITY)
Admission: EM | Admit: 2018-08-16 | Discharge: 2018-08-16 | Disposition: A | Discharge status: Home / Self Care | Payer: Medicare HMO | Attending: Emergency Medicine | Admitting: Emergency Medicine

## 2018-08-16 ENCOUNTER — Emergency Department (HOSPITAL_COMMUNITY): Payer: Medicare HMO

## 2018-08-16 ENCOUNTER — Encounter (HOSPITAL_COMMUNITY): Payer: Self-pay | Admitting: Emergency Medicine

## 2018-08-16 DIAGNOSIS — Z87891 Personal history of nicotine dependence: Secondary | ICD-10-CM | POA: Insufficient documentation

## 2018-08-16 DIAGNOSIS — E119 Type 2 diabetes mellitus without complications: Secondary | ICD-10-CM | POA: Diagnosis not present

## 2018-08-16 DIAGNOSIS — Z7984 Long term (current) use of oral hypoglycemic drugs: Secondary | ICD-10-CM | POA: Diagnosis not present

## 2018-08-16 DIAGNOSIS — R072 Precordial pain: Secondary | ICD-10-CM

## 2018-08-16 DIAGNOSIS — I959 Hypotension, unspecified: Secondary | ICD-10-CM | POA: Diagnosis not present

## 2018-08-16 DIAGNOSIS — I10 Essential (primary) hypertension: Secondary | ICD-10-CM | POA: Diagnosis not present

## 2018-08-16 DIAGNOSIS — R110 Nausea: Secondary | ICD-10-CM | POA: Diagnosis not present

## 2018-08-16 DIAGNOSIS — R0789 Other chest pain: Secondary | ICD-10-CM | POA: Diagnosis not present

## 2018-08-16 DIAGNOSIS — R079 Chest pain, unspecified: Secondary | ICD-10-CM | POA: Diagnosis not present

## 2018-08-16 LAB — CBC
HEMATOCRIT: 35.9 % — AB (ref 36.0–46.0)
Hemoglobin: 10.4 g/dL — ABNORMAL LOW (ref 12.0–15.0)
MCH: 21.3 pg — ABNORMAL LOW (ref 26.0–34.0)
MCHC: 29 g/dL — AB (ref 30.0–36.0)
MCV: 73.4 fL — ABNORMAL LOW (ref 78.0–100.0)
Platelets: 274 10*3/uL (ref 150–400)
RBC: 4.89 MIL/uL (ref 3.87–5.11)
RDW: 15.9 % — ABNORMAL HIGH (ref 11.5–15.5)
WBC: 8.5 10*3/uL (ref 4.0–10.5)

## 2018-08-16 LAB — BASIC METABOLIC PANEL
ANION GAP: 9 (ref 5–15)
BUN: 19 mg/dL (ref 8–23)
CALCIUM: 9.4 mg/dL (ref 8.9–10.3)
CO2: 24 mmol/L (ref 22–32)
Chloride: 105 mmol/L (ref 98–111)
Creatinine, Ser: 1.09 mg/dL — ABNORMAL HIGH (ref 0.44–1.00)
GFR calc Af Amer: >60 mL/min (ref >60)
GFR, EST NON AFRICAN AMERICAN: 52 mL/min — AB (ref >60)
GLUCOSE: 141 mg/dL — AB (ref 70–99)
POTASSIUM: 3.6 mmol/L (ref 3.5–5.1)
SODIUM: 138 mmol/L (ref 135–145)

## 2018-08-16 LAB — I-STAT CHEM 8, ED
BUN: 20 mg/dL (ref 8–23)
CALCIUM ION: 1.15 mmol/L (ref 1.15–1.40)
CREATININE: 1 mg/dL (ref 0.44–1.00)
Chloride: 104 mmol/L (ref 98–111)
GLUCOSE: 139 mg/dL — AB (ref 70–99)
HCT: 35 % — ABNORMAL LOW (ref 36.0–46.0)
Hemoglobin: 11.9 g/dL — ABNORMAL LOW (ref 12.0–15.0)
Potassium: 3.6 mmol/L (ref 3.5–5.1)
Sodium: 138 mmol/L (ref 135–145)
TCO2: 25 mmol/L (ref 22–32)

## 2018-08-16 LAB — I-STAT TROPONIN, ED
TROPONIN I, POC: 0.01 ng/mL (ref 0.00–0.08)
Troponin i, poc: 0 ng/mL (ref 0.00–0.08)

## 2018-08-16 MED ORDER — SODIUM CHLORIDE 0.9 % IV BOLUS
1000.0000 mL | Freq: Once | INTRAVENOUS | Status: AC
  Administered 2018-08-16: 1000 mL via INTRAVENOUS

## 2018-08-16 MED ORDER — GI COCKTAIL ~~LOC~~
30.0000 mL | Freq: Once | ORAL | Status: AC
  Administered 2018-08-16: 30 mL via ORAL
  Filled 2018-08-16: qty 30

## 2018-08-16 NOTE — ED Provider Notes (Signed)
[facility] EMERGENCY DEPARTMENT Provider Note   CSN: [id number] Arrival date & time: 08/16/18  2353     History   Chief Complaint Chief Complaint  Patient presents with  . Chest Pain    HPI Eileen Jefferson is a 66 y.o. female.  Patient with history of hypertension, high cholesterol, diabetes, strong family history of first-degree relatives with coronary artery disease presents with acute onset of chest pain that woke her from sleep at this morning at approximately 5 AM.  Patient woke up to use the restroom and had symptoms.  Pain is rated as 6 out of 10, substernal.  It does not radiate.  She has had associated nausea but no vomiting.  No diaphoresis.  No palpitations, lightheadedness or syncope.  Patient does not have a history of coronary artery disease.  She had a remote stress test that was normal reported per patient.  EMS was called for symptoms.  Patient took 4 baby aspirin.  She was given nitroglycerin without any effect.  She does not smoke.  She denies any chest pain or cough.  No recent fevers or abdominal pains.  Symptoms are gradually improving upon arrival to the emergency department.     Past Medical History:  Diagnosis Date  . DDD (degenerative disc disease), cervical 07/11/2017   C5-6 and C6-7 cervical spondylosis and degenerative disc disease on 2005 Xray  . Diverticulosis   . Glaucoma 04/06/2014  . HTN (hypertension)   . Hyperlipidemia   . 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 Iron deficiency anemia   . Pulmonary sarcoidosis ([place])   . Sleep apnea   . Thyroid disease   . 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 DDD (degenerative disc disease), cervical 07/11/2017  . Localized osteoarthrosis of right shoulder region 07/11/2017  . Anemia 07/11/2017  . Hyperlipidemia 06/28/2016  . Thyroid disease 06/28/2016  . Diabetic  retinopathy ([place]) 05/05/2015  . Obesity 01/11/2015  . Glaucoma 04/06/2014  . Macular degeneration 04/06/2014  . 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 HTN (hypertension) 10/02/2013  . Sarcoidosis 10/02/2013  . Diabetes mellitus, type 2 ([place]) 10/02/2013  . OSA (obstructive sleep apnea) 10/23/2012    Past Surgical History:  Procedure Laterality Date  . APPENDECTOMY    . c-section     x 2  . CATARACT EXTRACTION     bilateral  . SCLERAL BUCKLE     right  . TONSILLECTOMY    . TOTAL ABDOMINAL HYSTERECTOMY    . TUBAL LIGATION    . VITRECTOMY     bilateral     OB History   None      Home Medications    Prior to Admission medications   Medication Sig Start Date End Date Taking? Authorizing Provider  ACCU-CHEK SOFTCLIX LANCETS lancets Test blood sugar once daily. Dx: E11.9 10/25/16   Shawnee Knapp, MD  Alcohol Swabs PADS Test blood sugar once daily. Dx: E11.9 10/25/16   Shawnee Knapp, MD  Blood Glucose Calibration (ACCU-CHEK AVIVA) SOLN Test blood sugar once daily. Dx: E11.9 10/25/16   Shawnee Knapp, MD  Blood Glucose Monitoring Suppl (ACCU-CHEK AVIVA PLUS) w/Device KIT Test blood sugar once daily. Dx: E11.9 10/25/16   Shawnee Knapp, MD  brimonidine-timolol (COMBIGAN) 0.2-0.5 % ophthalmic solution Place 1 drop into the left eye 2 (two) times daily.  [provider]  brinzolamide (AZOPT) 1 % ophthalmic suspension Place 1 drop into the left eye 2 (two) times daily.      [provider]  glucose blood (ACCU-CHEK AVIVA PLUS) test strip Test blood sugar once daily. Dx: E11.9 10/25/16   Shawnee Knapp, MD  lisinopril-hydrochlorothiazide (PRINZIDE,ZESTORETIC) 10-12.5 MG tablet Take 1 tablet by mouth daily. 03/07/18   Shawnee Knapp, MD  metFORMIN (GLUCOPHAGE) 500 MG tablet TAKE 1 TABLET (500 MG TOTAL) BY MOUTH 2 (TWO) TIMES DAILY WITH A MEAL. 03/07/18   Shawnee Knapp, MD  rosuvastatin (CRESTOR) 40 MG tablet Take 1 tablet (40 mg total) by mouth daily. 03/07/18    Shawnee Knapp, MD    Family History Family History  Problem Relation Age of Onset  . Colon polyps Mother        brothers x2  . Clotting disorder Mother        brother x 2, MGM  . Heart disease Brother        mother, MGM  . Diabetes Brother        x 2, MGM  . Stroke Maternal Grandmother   . Colon cancer Neg Hx   . Breast cancer Neg Hx     Social History Social History   Tobacco Use  . Smoking status: Former Smoker    Packs/day: 0.20    Years: 1.00    Pack years: 0.20    Types: Cigarettes    Last attempt to quit: 12/11/1968    Years since quitting: 49.7  . Smokeless tobacco: Never Used  Substance Use Topics  . Alcohol use: No  . Drug use: No     Allergies   Contrast media [iodinated diagnostic agents] and Iohexol   Review of Systems Review of Systems  Constitutional: Negative for diaphoresis and fever.  Eyes: Negative for redness.  Respiratory: Negative for cough and shortness of breath.   Cardiovascular: Positive for chest pain. Negative for palpitations and leg swelling.  Gastrointestinal: Positive for nausea. Negative for abdominal pain and vomiting.  Genitourinary: Negative for dysuria.  Musculoskeletal: Negative for back pain and neck pain.  Skin: Negative for rash.  Neurological: Negative for syncope and light-headedness.  Psychiatric/Behavioral: The patient is not nervous/anxious.      Physical Exam Updated Vital Signs BP (!) 134/57   Pulse (!) 58   Temp 97.9 F (36.6 C) (Oral)   Resp 15   Ht 5' 2.5" (1.588 m)   Wt 97.5 kg   SpO2 99%   BMI 38.70 kg/m   Physical Exam  Constitutional: She appears well-developed and well-nourished.  HENT:  Head: Normocephalic and atraumatic.  Mouth/Throat: Mucous membranes are normal. Mucous membranes are not dry.  Eyes: Conjunctivae are normal.  Neck: Trachea normal and normal range of motion. Neck supple. Normal carotid pulses and no JVD present. No muscular tenderness present. Carotid bruit is not present.  No tracheal deviation present.  Cardiovascular: Normal rate, regular rhythm, S1 normal, S2 normal, normal heart sounds and intact distal pulses. Exam reveals no decreased pulses.  No murmur heard. Pulmonary/Chest: Effort normal. No respiratory distress. She has no wheezes. She exhibits no tenderness.  Abdominal: Soft. Normal aorta and bowel sounds are normal. There is no tenderness. There is no rebound and no guarding.  Musculoskeletal: Normal range of motion.  Neurological: She is alert.  Skin: Skin is warm and dry. She is not diaphoretic. No cyanosis. No pallor.  Psychiatric: She has a normal mood and affect.  Nursing  note and vitals reviewed.    ED Treatments / Results  Labs (all labs ordered are listed, but only abnormal results are displayed) Labs Reviewed  BASIC METABOLIC PANEL - Abnormal; Notable for the following components:      Result Value   Glucose, Bld 141 (*)    Creatinine, Ser 1.09 (*)    GFR calc non Af Amer 52 (*)    All other components within normal limits  CBC - Abnormal; Notable for the following components:   Hemoglobin 10.4 (*)    HCT 35.9 (*)    MCV 73.4 (*)    MCH 21.3 (*)    MCHC 29.0 (*)    RDW 15.9 (*)    All other components within normal limits  I-STAT CHEM 8, ED - Abnormal; Notable for the following components:   Glucose, Bld 139 (*)    Hemoglobin 11.9 (*)    HCT 35.0 (*)    All other components within normal limits  I-STAT TROPONIN, ED  I-STAT TROPONIN, ED    EKG EKG Interpretation  Date/Time:  Friday August 16 2018 06:36:09 EDT Ventricular Rate:  73 PR Interval:    QRS Duration: 81 QT Interval:  386 QTC Calculation: 426 R Axis:   67 Text Interpretation:  Sinus rhythm Borderline T wave abnormalities similar to prior 6/08 Confirmed by Aletta Edouard [phone]) on 08/16/2018 6:51:50 AM   EKG Interpretation  Date/Time:  Friday August 16 2018 10:00:54 EDT Ventricular Rate:  59 PR Interval:    QRS Duration: 98 QT  Interval:  402 QTC Calculation: 399 R Axis:   58 Text Interpretation:  Sinus rhythm nonspecific t flattening comapred with 6/08 Confirmed by Aletta Edouard [phone]) on 08/16/2018 10:07:31 AM        Radiology Dg Chest 2 View  Result Date: 08/16/2018 CLINICAL DATA:  Initial evaluation for acute chest pain. EXAM: CHEST - 2 VIEW COMPARISON:  Prior radiograph from 03/24/2017. FINDINGS: The cardiac and mediastinal silhouettes are stable in size and contour, and remain within normal limits. The lungs are normally inflated. No airspace consolidation, pleural effusion, or pulmonary edema is identified. There is no pneumothorax. No acute osseous abnormality identified. Mild thoracic dextroscoliosis. IMPRESSION: No active cardiopulmonary disease. Electronically Signed   By: Jeannine Boga M.D.   On: 08/16/2018 07:03    Procedures Procedures (including critical care time)  Medications Ordered in ED Medications  sodium chloride 0.9 % bolus 1,000 mL (0 mLs Intravenous Stopped 08/16/18 0837)  gi cocktail (Maalox,Lidocaine,Donnatal) (30 mLs Oral Given 08/16/18 9892)     Initial Impression / Assessment and Plan / ED Course  I have reviewed the triage vital signs and the nursing notes.  Pertinent labs & imaging results that were available during my care of the patient were reviewed by me and considered in my medical decision making (see chart for details).  Clinical Course as of Aug 16 841  Fri Aug 16, 4358  763 67 year old female with no prior cardiac history but strong family history and is also diabetic here with substernal chest pressure that occurred around 5 AM this morning.  She said she is gotten similar discomforts before but not this intense and she usually thinks it is reflux.  Her initial EKG and troponin are unremarkable.  She is at least going to get a delta troponin and we offered her admission versus home with close follow-up with her PCP if her delta is negative   [MB]    Clinical  Course User Index [MB] Melina Copa,  Rebeca Alert, MD    Patient seen and examined.  Patient discussed with and seen by Dr. Melina Copa.  Patient has a heart score equal to 4.  She was offered admission however on initial discussion, is reluctant to come into the hospital.  She does agree to stay for delta troponin and seems reliable to follow-up with her doctor.  Will reassess patient.  Vital signs reviewed and are as follows: BP (!) 134/57   Pulse (!) 58   Temp 97.9 F (36.6 C) (Oral)   Resp 15   Ht 5' 2.5" (1.588 m)   Wt 97.5 kg   SpO2 99%   BMI 38.70 kg/m   11:09 AM delta troponin is negative.  Repeat EKG is unchanged.  Symptoms are currently resolved.  Patient would like to go home.  She understands that she is at moderate risk for acute coronary syndrome.  We discussed need for close follow-up with her primary care doctor.  Also discussed need for patient to return with recurrent symptoms or persistent chest pains.  She verbalizes understanding agrees with plan.  Patient was counseled to return with severe chest pain, especially if the pain is crushing or pressure-like and spreads to the arms, back, neck, or jaw, or if they have sweating, nausea, or shortness of breath with the pain. They were encouraged to call 911 with these symptoms.   They were also told to return if their chest pain gets worse and does not go away with rest, they have an attack of chest pain lasting longer than usual despite rest and treatment with the medications their caregiver has prescribed, if they wake from sleep with chest pain or shortness of breath, if they feel dizzy or faint, if they have chest pain not typical of their usual pain, or if they have any other emergent concerns regarding their health.  The patient verbalized understanding and agreed.    Final Clinical Impressions(s) / ED Diagnoses   Final diagnoses:  Substernal chest pain   Patient with atypical chest pain symptoms starting during sleep today  with EKG nonischemic x2, troponin negative x2.  She does have risk factors and her heart score equals 4.  Offered admission, she does not want to stay in the hospital.  She does have appropriate PCP follow-up and is willing to follow-up and return if symptoms worsen.  Symptoms are not consistent with DVT/PE.  Do not suspect dissection.  Symptoms are currently resolved.  Lab work-up is otherwise reassuring with clear chest x-ray.  ED Discharge Orders    None       Carlisle Cater, Hershal Coria 08/16/18 1111    Hayden Rasmussen, MD 08/17/18 [phone]

## 2018-08-16 NOTE — ED Triage Notes (Signed)
Pt arrives via EMS from home for substernal chest pain starting this morning. Reports some improvement after 1 SL nitro and 324 mg aspirin. EMS also gave 4mg  zofran for nausea which did not help.

## 2018-08-16 NOTE — Discharge Instructions (Signed)
Please read and follow all provided instructions.  Your diagnoses today include:  1. Substernal chest pain     Tests performed today include:  An EKG of your heart  A chest x-ray  Cardiac enzymes - a blood test for heart muscle damage  Blood counts and electrolytes  Vital signs. See below for your results today.   Medications prescribed:   None  Take any prescribed medications only as directed.  Follow-up instructions: Please follow-up with your primary care provider as soon as you can for further evaluation of your symptoms.   Return instructions:  SEEK IMMEDIATE MEDICAL ATTENTION IF:  You have severe chest pain, especially if the pain is crushing or pressure-like and spreads to the arms, back, neck, or jaw, or if you have sweating, nausea (feeling sick to your stomach), or shortness of breath. THIS IS AN EMERGENCY. Don't wait to see if the pain will go away. Get medical help at once. Call 911 or 0 (operator). DO NOT drive yourself to the hospital.   Your chest pain gets worse and does not go away with rest.   You have an attack of chest pain lasting longer than usual, despite rest and treatment with the medications your caregiver has prescribed.   You wake from sleep with chest pain or shortness of breath.  You feel dizzy or faint.  You have chest pain not typical of your usual pain for which you originally saw your caregiver.   You have any other emergent concerns regarding your health.  Additional Information: Chest pain comes from many different causes. Your caregiver has diagnosed you as having chest pain that is not specific for one problem, but does not require admission.  You are at low risk for an acute heart condition or other serious illness.   Your vital signs today were: BP 126/61    Pulse (!) 57    Temp 97.9 F (36.6 C) (Oral)    Resp 15    Ht 5' 2.5" (1.588 m)    Wt 97.5 kg    SpO2 98%    BMI 38.70 kg/m  If your blood pressure (BP) was elevated above  135/85 this visit, please have this repeated by your doctor within one month. --------------

## 2018-08-19 ENCOUNTER — Ambulatory Visit: Payer: Medicare HMO | Admitting: Family Medicine

## 2018-09-13 ENCOUNTER — Ambulatory Visit: Payer: Medicare HMO | Admitting: Family Medicine

## 2018-09-14 ENCOUNTER — Other Ambulatory Visit: Payer: Self-pay | Admitting: Family Medicine

## 2018-09-14 DIAGNOSIS — E119 Type 2 diabetes mellitus without complications: Secondary | ICD-10-CM

## 2018-09-16 NOTE — Telephone Encounter (Signed)
Requested medication (s) are due for refill today: yes  Requested medication (s) are on the active medication list: yes  Last refill:  03/07/18  Future visit scheduled: yes  Notes to clinic:  Last HA1C 6.5 on 02/13/18. Need labs per protocol.  Requested Prescriptions  Pending Prescriptions Disp Refills   metFORMIN (GLUCOPHAGE) 500 MG tablet [Pharmacy Med Name: METFORMIN HYDROCHLORIDE 500 MG Tablet] 180 tablet 1    Sig: TAKE 1 TABLET (500 MG TOTAL) BY MOUTH 2 (TWO) TIMES DAILY WITH A MEAL.     Endocrinology:  Diabetes - Biguanides Failed - 09/14/2018  1:48 PM      Failed - HBA1C is between 0 and 7.9 and within 180 days    Hemoglobin A1C  Date Value Ref Range Status  02/13/2018 6.5  Final   Hgb A1c MFr Bld  Date Value Ref Range Status  01/11/2017 6.3 (H) 4.8 - 5.6 % Final    Comment:             Pre-diabetes: 5.7 - 6.4          Diabetes: >6.4          Glycemic control for adults with diabetes: <7.0          Passed - Cr in normal range and within 360 days    Creat  Date Value Ref Range Status  06/29/2016 1.11 (H) 0.50 - 0.99 mg/dL Final    Comment:      For patients > or = 65 years of age: The upper reference limit for Creatinine is approximately 13% higher for people identified as African-American.      Creatinine, Ser  Date Value Ref Range Status  08/16/2018 1.00 0.44 - 1.00 mg/dL Final         Passed - eGFR in normal range and within 360 days    GFR calc Af Amer  Date Value Ref Range Status  08/16/2018 >60 >60 mL/min Final    Comment:    (NOTE) The eGFR has been calculated using the CKD EPI equation. This calculation has not been validated in all clinical situations. eGFR's persistently <60 mL/min signify possible Chronic Kidney Disease.    GFR calc non Af Amer  Date Value Ref Range Status  08/16/2018 52 (L) >60 mL/min Final         Passed - Valid encounter within last 6 months    Recent Outpatient Visits          5 months ago Cough   Primary Care  at [place], PA-C   7 months ago Annual physical exam   [facility] at [facility], Laurey Arrow, MD   1 year ago Type 2 diabetes mellitus with retinopathy, without long-term current use of insulin, macular edema presence unspecified, unspecified laterality, unspecified retinopathy severity [facility])   Primary Care at [facility], Laurey Arrow, MD   1 year ago Axillary pain, right   [facility] Care at [facility], Laurey Arrow, MD   1 year ago Medicare annual wellness visit, subsequent   Primary Care at [facility] Laurey Arrow, MD      Future Appointments            In 4 weeks Shawnee Knapp, MD Primary Care at [place], [facility]

## 2018-09-23 ENCOUNTER — Other Ambulatory Visit: Payer: Self-pay

## 2018-09-23 ENCOUNTER — Ambulatory Visit (INDEPENDENT_AMBULATORY_CARE_PROVIDER_SITE_OTHER): Payer: 59 | Admitting: Emergency Medicine

## 2018-09-23 ENCOUNTER — Encounter: Payer: Self-pay | Admitting: Emergency Medicine

## 2018-09-23 VITALS — BP 148/82 | HR 78 | Temp 98.6°F | Resp 16 | Ht 62.0 in | Wt 214.4 lb

## 2018-09-23 DIAGNOSIS — R05 Cough: Secondary | ICD-10-CM

## 2018-09-23 DIAGNOSIS — J069 Acute upper respiratory infection, unspecified: Secondary | ICD-10-CM | POA: Diagnosis not present

## 2018-09-23 DIAGNOSIS — R059 Cough, unspecified: Secondary | ICD-10-CM

## 2018-09-23 DIAGNOSIS — J029 Acute pharyngitis, unspecified: Secondary | ICD-10-CM

## 2018-09-23 MED ORDER — AZITHROMYCIN 250 MG PO TABS: ORAL_TABLET | ORAL | 0 refills | Status: DC

## 2018-09-23 NOTE — Patient Instructions (Addendum)
     If you have lab work done today you will be contacted with your lab results within the next 2 weeks.  If you have not heard from us then please contact us. The fastest way to get your results is to register for My Chart.   IF you received an x-ray today, you will receive an invoice from [facility]. Please contact [facility] at [phone] with questions or concerns regarding your invoice.   IF you received labwork today, you will receive an invoice from [facility]. Please contact [facility] at [phone] with questions or concerns regarding your invoice.   Our billing staff will not be able to assist you with questions regarding bills from these companies.  You will be contacted with the lab results as soon as they are available. The fastest way to get your results is to activate your My Chart account. Instructions are located on the last page of this paperwork. If you have not heard from us regarding the results in 2 weeks, please contact this office.     Cough, Adult A cough helps to clear your throat and lungs. A cough may last only 2-3 weeks (acute), or it may last longer than 8 weeks (chronic). Many different things can cause a cough. A cough may be a sign of an illness or another medical condition. Follow these instructions at home:  Pay attention to any changes in your cough.  Take medicines only as told by your doctor. ? If you were prescribed an antibiotic medicine, take it as told by your doctor. Do not stop taking it even if you start to feel better. ? Talk with your doctor before you try using a cough medicine.  Drink enough fluid to keep your pee (urine) clear or pale yellow.  If the air is dry, use a cold steam vaporizer or humidifier in your home.  Stay away from things that make you cough at work or at home.  If your cough is worse at night, try using extra pillows to raise your head up higher while you sleep.  Do not smoke, and try not to be  around smoke. If you need help quitting, ask your doctor.  Do not have caffeine.  Do not drink alcohol.  Rest as needed. Contact a doctor if:  You have new problems (symptoms).  You cough up yellow fluid (pus).  Your cough does not get better after 2-3 weeks, or your cough gets worse.  Medicine does not help your cough and you are not sleeping well.  You have pain that gets worse or pain that is not helped with medicine.  You have a fever.  You are losing weight and you do not know why.  You have night sweats. Get help right away if:  You cough up blood.  You have trouble breathing.  Your heartbeat is very fast. This information is not intended to replace advice given to you by your health care provider. Make sure you discuss any questions you have with your health care provider. Document Released: 08/10/2011 Document Revised: 05/04/2016 Document Reviewed: 02/03/2015 Elsevier Interactive Patient Education  2018 Elsevier Inc.  

## 2018-09-23 NOTE — Progress Notes (Signed)
Eileen Jefferson 67 y.o.   Chief Complaint  Patient presents with  . Cough    x 2 days productive with yellow green mucus  . Sore Throat    scratchy    HISTORY OF PRESENT ILLNESS: This is a 67 y.o. female complaining of 2-day history of productive cough, sore throat, congestion, and general weakness.  Eating and drinking well.  Denies nausea or vomiting.  Denies fever or chills.  No other significant symptoms.  HPI   Prior to Admission medications   Medication Sig Start Date End Date Taking? Authorizing Provider  aspirin EC 81 MG tablet Take 81 mg by mouth daily.   Yes [provider]  brimonidine-timolol (COMBIGAN) 0.2-0.5 % ophthalmic solution Place 1 drop into the left eye 2 (two) times daily.     Yes [provider]  brinzolamide (AZOPT) 1 % ophthalmic suspension Place 1 drop into the left eye 2 (two) times daily.     Yes [provider]  lisinopril-hydrochlorothiazide (PRINZIDE,ZESTORETIC) 10-12.5 MG tablet Take 1 tablet by mouth daily. 03/07/18  Yes Shawnee Knapp, MD  metFORMIN (GLUCOPHAGE) 500 MG tablet TAKE 1 TABLET (500 MG TOTAL) BY MOUTH 2 (TWO) TIMES DAILY WITH A MEAL. 09/18/18  Yes Shawnee Knapp, MD  rosuvastatin (CRESTOR) 40 MG tablet Take 1 tablet (40 mg total) by mouth daily. 03/07/18  Yes Shawnee Knapp, MD  ACCU-CHEK SOFTCLIX LANCETS lancets Test blood sugar once daily. Dx: E11.9 10/25/16   Shawnee Knapp, MD  Alcohol Swabs PADS Test blood sugar once daily. Dx: E11.9 10/25/16   Shawnee Knapp, MD  Blood Glucose Calibration (ACCU-CHEK AVIVA) SOLN Test blood sugar once daily. Dx: E11.9 10/25/16   Shawnee Knapp, MD  Blood Glucose Monitoring Suppl (ACCU-CHEK AVIVA PLUS) w/Device KIT Test blood sugar once daily. Dx: E11.9 10/25/16   Shawnee Knapp, MD  glucose blood (ACCU-CHEK AVIVA PLUS) test strip Test blood sugar once daily. Dx: E11.9 10/25/16   Shawnee Knapp, MD    Allergies  Allergen Reactions  . Contrast Media [Iodinated Diagnostic Agents]   . Iohexol Other  (See Comments)    " made me feel like I was burning inside"    Patient Active Problem List   Diagnosis Date Noted  . DDD (degenerative disc disease), cervical 07/11/2017  . Localized osteoarthrosis of right shoulder region 07/11/2017  . Anemia 07/11/2017  . Hyperlipidemia 06/28/2016  . Thyroid disease 06/28/2016  . Diabetic retinopathy ([place]) 05/05/2015  . Obesity 01/11/2015  . Glaucoma 04/06/2014  . Macular degeneration 04/06/2014  . 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 HTN (hypertension) 10/02/2013  . Sarcoidosis 10/02/2013  . Diabetes mellitus, type 2 ([place]) 10/02/2013  . OSA (obstructive sleep apnea) 10/23/2012    Past Medical History:  Diagnosis Date  . DDD (degenerative disc disease), cervical 07/11/2017   C5-6 and C6-7 cervical spondylosis and degenerative disc disease on 2005 Xray  . Diverticulosis   . Glaucoma 04/06/2014  . HTN (hypertension)   . Hyperlipidemia   . 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 Iron deficiency anemia   . Pulmonary sarcoidosis ([place])   . Sleep apnea   . Thyroid disease   . 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)     Past Surgical History:  Procedure Laterality Date  . APPENDECTOMY    . c-section     x 2  . CATARACT EXTRACTION     bilateral  . SCLERAL  BUCKLE     right  . TONSILLECTOMY    . TOTAL ABDOMINAL HYSTERECTOMY    . TUBAL LIGATION    . VITRECTOMY     bilateral    Social History   Socioeconomic History  . Marital status: Married    Spouse name: Not on file  . Number of children: 2  . Years of education: Not on file  . Highest education level: Not on file  Occupational History  . Occupation: behavioral health  Social Needs  . Financial resource strain: Not on file  . Food insecurity:    Worry: Not on file    Inability: Not on file  . Transportation needs:    Medical: Not on file    Non-medical: Not on file  Tobacco Use  . Smoking  status: Former Smoker    Packs/day: 0.20    Years: 1.00    Pack years: 0.20    Types: Cigarettes    Last attempt to quit: 12/11/1968    Years since quitting: 49.8  . Smokeless tobacco: Never Used  Substance and Sexual Activity  . Alcohol use: No  . Drug use: No  . Sexual activity: Not on file  Lifestyle  . Physical activity:    Days per week: Not on file    Minutes per session: Not on file  . Stress: Not on file  Relationships  . Social connections:    Talks on phone: Not on file    Gets together: Not on file    Attends religious service: Not on file    Active member of club or organization: Not on file    Attends meetings of clubs or organizations: Not on file    Relationship status: Not on file  . Intimate partner violence:    Fear of current or ex partner: Not on file    Emotionally abused: Not on file    Physically abused: Not on file    Forced sexual activity: Not on file  Other Topics Concern  . Not on file  Social History Narrative  . Not on file    Family History  Problem Relation Age of Onset  . Colon polyps Mother        brothers x2  . Clotting disorder Mother        brother x 2, MGM  . Heart disease Brother        mother, MGM  . Diabetes Brother        x 2, MGM  . Stroke Maternal Grandmother   . Colon cancer Neg Hx   . Breast cancer Neg Hx      Review of Systems  Constitutional: Negative.  Negative for chills and fever.  HENT: Positive for congestion and sore throat.   Eyes: Negative for discharge and redness.  Respiratory: Positive for cough and sputum production. Negative for shortness of breath.   Cardiovascular: Negative.  Negative for chest pain and palpitations.  Gastrointestinal: Negative for abdominal pain, diarrhea, nausea and vomiting.  Genitourinary: Negative.  Negative for dysuria.  Musculoskeletal: Negative.   Skin: Negative.  Negative for rash.  Neurological: Negative.  Negative for dizziness and headaches.  Endo/Heme/Allergies:  Negative.    Vitals:   09/23/18 1450  BP: (!) 148/82  Pulse: 78  Resp: 16  Temp: 98.6 F (37 C)  SpO2: 97%    Physical Exam  Constitutional: She is oriented to person, place, and time. She appears well-developed and well-nourished.  HENT:  Head: Normocephalic and atraumatic.  Right Ear: External ear normal.  Left Ear: External ear normal.  Nose: Nose normal.  Mouth/Throat: Oropharynx is clear and moist.  Eyes: Pupils are equal, round, and reactive to light. Conjunctivae and EOM are normal.  Neck: Normal range of motion. Neck supple.  Cardiovascular: Normal rate, regular rhythm and normal heart sounds.  Pulmonary/Chest: Effort normal and breath sounds normal.  Abdominal: Soft. Bowel sounds are normal. She exhibits no distension. There is no tenderness.  Musculoskeletal: Normal range of motion. She exhibits no edema.  Neurological: She is alert and oriented to person, place, and time. No sensory deficit. She exhibits normal muscle tone.  Skin: Skin is warm and dry. Capillary refill takes less than 2 seconds.  Psychiatric: She has a normal mood and affect. Her behavior is normal.  Vitals reviewed.  A total of 25 minutes was spent in the room with the patient, greater than 50% of which was in counseling/coordination of care regarding differential diagnosis, management, medications, prognosis, and need for follow-up if no better or worse.   ASSESSMENT & PLAN:  Eileen Jefferson was seen today for cough and sore throat.  Diagnoses and all orders for this visit:  Cough  Sore throat  Acute upper respiratory infection -     azithromycin (ZITHROMAX) 250 MG tablet; Sig as indicated    Patient Instructions       If you have lab work done today you will be contacted with your lab results within the next 2 weeks.  If you have not heard from [place] then please contact us. The fastest way to get your results is to register for My Chart.   IF you received an x-ray today, you will receive  an invoice from [facility]. Please contact [facility] at [phone] with questions or concerns regarding your invoice.   IF you received labwork today, you will receive an invoice from [facility]. Please contact [facility] at [phone] with questions or concerns regarding your invoice.   Our billing staff will not be able to assist you with questions regarding bills from these companies.  You will be contacted with the lab results as soon as they are available. The fastest way to get your results is to activate your My Chart account. Instructions are located on the last page of this paperwork. If you have not heard from [place] regarding the results in 2 weeks, please contact this office.     Cough, Adult A cough helps to clear your throat and lungs. A cough may last only 2-3 weeks (acute), or it may last longer than 8 weeks (chronic). Many different things can cause a cough. A cough may be a sign of an illness or another medical condition. Follow these instructions at home:  Pay attention to any changes in your cough.  Take medicines only as told by your doctor. ? If you were prescribed an antibiotic medicine, take it as told by your doctor. Do not stop taking it even if you start to feel better. ? Talk with your doctor before you try using a cough medicine.  Drink enough fluid to keep your pee (urine) clear or pale yellow.  If the air is dry, use a cold steam vaporizer or humidifier in your home.  Stay away from things that make you cough at work or at home.  If your cough is worse at night, try using extra pillows to raise your head up higher while you sleep.  Do not smoke, and try not to be around smoke. If  you need help quitting, ask your doctor.  Do not have caffeine.  Do not drink alcohol.  Rest as needed. Contact a doctor if:  You have new problems (symptoms).  You cough up yellow fluid (pus).  Your cough does not get better after 2-3 weeks, or your  cough gets worse.  Medicine does not help your cough and you are not sleeping well.  You have pain that gets worse or pain that is not helped with medicine.  You have a fever.  You are losing weight and you do not know why.  You have night sweats. Get help right away if:  You cough up blood.  You have trouble breathing.  Your heartbeat is very fast. This information is not intended to replace advice given to you by your health care provider. Make sure you discuss any questions you have with your health care provider. Document Released: 08/10/2011 Document Revised: 05/04/2016 Document Reviewed: 02/03/2015 Elsevier Interactive Patient Education  2018 Elsevier Inc.     Agustina Caroli, MD [facility]

## 2018-10-15 ENCOUNTER — Ambulatory Visit: Payer: Medicare HMO | Admitting: Family Medicine

## 2018-11-23 ENCOUNTER — Ambulatory Visit (INDEPENDENT_AMBULATORY_CARE_PROVIDER_SITE_OTHER): Payer: Medicare HMO | Admitting: Family Medicine

## 2018-11-23 ENCOUNTER — Encounter: Payer: Self-pay | Admitting: Family Medicine

## 2018-11-23 VITALS — BP 123/81 | HR 77 | Temp 98.6°F | Resp 16 | Ht 63.25 in | Wt 220.0 lb

## 2018-11-23 DIAGNOSIS — E7800 Pure hypercholesterolemia, unspecified: Secondary | ICD-10-CM | POA: Diagnosis not present

## 2018-11-23 DIAGNOSIS — Z23 Encounter for immunization: Secondary | ICD-10-CM | POA: Diagnosis not present

## 2018-11-23 DIAGNOSIS — E119 Type 2 diabetes mellitus without complications: Secondary | ICD-10-CM

## 2018-11-23 DIAGNOSIS — D869 Sarcoidosis, unspecified: Secondary | ICD-10-CM

## 2018-11-23 DIAGNOSIS — I10 Essential (primary) hypertension: Secondary | ICD-10-CM | POA: Diagnosis not present

## 2018-11-23 DIAGNOSIS — D649 Anemia, unspecified: Secondary | ICD-10-CM

## 2018-11-23 DIAGNOSIS — K219 Gastro-esophageal reflux disease without esophagitis: Secondary | ICD-10-CM | POA: Insufficient documentation

## 2018-11-23 DIAGNOSIS — Z6838 Body mass index (BMI) 38.0-38.9, adult: Secondary | ICD-10-CM

## 2018-11-23 DIAGNOSIS — E66812 Obesity, class 2: Secondary | ICD-10-CM

## 2018-11-23 DIAGNOSIS — E079 Disorder of thyroid, unspecified: Secondary | ICD-10-CM | POA: Diagnosis not present

## 2018-11-23 DIAGNOSIS — E11319 Type 2 diabetes mellitus with unspecified diabetic retinopathy without macular edema: Secondary | ICD-10-CM | POA: Diagnosis not present

## 2018-11-23 MED ORDER — ROSUVASTATIN CALCIUM 40 MG PO TABS: 40.0000 mg | ORAL_TABLET | Freq: Every day | ORAL | 3 refills | Status: DC

## 2018-11-23 MED ORDER — FAMOTIDINE 40 MG PO TABS: 40.0000 mg | ORAL_TABLET | Freq: Two times a day (BID) | ORAL | 2 refills | Status: DC | PRN

## 2018-11-23 MED ORDER — OMEPRAZOLE 40 MG PO CPDR: 40.0000 mg | DELAYED_RELEASE_CAPSULE | Freq: Every day | ORAL | 2 refills | Status: DC

## 2018-11-23 MED ORDER — LISINOPRIL-HYDROCHLOROTHIAZIDE 10-12.5 MG PO TABS: 1.0000 | ORAL_TABLET | Freq: Every day | ORAL | 3 refills | Status: DC

## 2018-11-23 MED ORDER — METFORMIN HCL 500 MG PO TABS: 500.0000 mg | ORAL_TABLET | Freq: Two times a day (BID) | ORAL | 1 refills | Status: DC

## 2018-11-23 NOTE — Patient Instructions (Signed)
° ° ° °  If you have lab work done today you will be contacted with your lab results within the next 2 weeks.  If you have not heard from us then please contact us. The fastest way to get your results is to register for My Chart. ° ° °IF you received an x-ray today, you will receive an invoice from [facility]. Please contact [facility] at [phone] with questions or concerns regarding your invoice.  ° °IF you received labwork today, you will receive an invoice from [facility]. Please contact [facility] at [phone] with questions or concerns regarding your invoice.  ° °Our billing staff will not be able to assist you with questions regarding bills from these companies. ° °You will be contacted with the lab results as soon as they are available. The fastest way to get your results is to activate your My Chart account. Instructions are located on the last page of this paperwork. If you have not heard from us regarding the results in 2 weeks, please contact this office. °  ° ° ° °

## 2018-11-23 NOTE — Progress Notes (Signed)
Subjective:    Patient: Eileen Jefferson  DOB: [birth year]; 67 y.o.   MRN: [id number]  Chief Complaint  Patient presents with  . 6th month follow up    HPI  I last saw Eileen Jefferson 9 mos ago for her CPE - since then, she has been seen here twice for cough - last 2 mos ago, and for CP in ED 3 mos ago.  DM: On metformin 500 bid, on asa, statin, acei. microalb done 9 mos ago. Foot exam done today. Due for prevnar-13. cbgs fasting qam 105-120  HTN: On lisinopril-hctz 10-12.5.  HLD: ON crestor 40 - last was at goal.  Microcytic anemia; Of unknown etiology - nml ferritin/iron, vit B12 at last visit 9 mos ago.  GERD: On otc prilosec, just started within the last several weeks and helping a little.  Feeling full right after eating more so - more unconfortable but same eating. Does get worse with spice. More heartburn.  Worse at night when lying down. Normal BM, no melena.    Medical History Past Medical History:  Diagnosis Date  . DDD (degenerative disc disease), cervical 07/11/2017   C5-6 and C6-7 cervical spondylosis and degenerative disc disease on 2005 Xray  . Diverticulosis   . Glaucoma 04/06/2014  . HTN (hypertension)   . Hyperlipidemia   . 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 Iron deficiency anemia   . Pulmonary sarcoidosis ([place])   . Sleep apnea   . Thyroid disease   . 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 APPENDECTOMY    . c-section     x 2  . CATARACT EXTRACTION     bilateral  . SCLERAL BUCKLE     right  . TONSILLECTOMY    . TOTAL ABDOMINAL HYSTERECTOMY    . TUBAL LIGATION    . VITRECTOMY     bilateral   Current Outpatient Medications on File Prior to Visit  Medication Sig Dispense Refill  . ACCU-CHEK SOFTCLIX LANCETS lancets Test blood sugar once daily. Dx: E11.9 100 each 2  . Alcohol Swabs PADS Test blood sugar once daily. Dx: E11.9 100 each 2    . aspirin EC 81 MG tablet Take 81 mg by mouth daily.    Marland Kitchen azithromycin (ZITHROMAX) 250 MG tablet Sig as indicated 6 tablet 0  . Blood Glucose Calibration (ACCU-CHEK AVIVA) SOLN Test blood sugar once daily. Dx: E11.9 1 each 2  . Blood Glucose Monitoring Suppl (ACCU-CHEK AVIVA PLUS) w/Device KIT Test blood sugar once daily. Dx: E11.9 1 kit 0  . brimonidine-timolol (COMBIGAN) 0.2-0.5 % ophthalmic solution Place 1 drop into the left eye 2 (two) times daily.      . brinzolamide (AZOPT) 1 % ophthalmic suspension Place 1 drop into the left eye 2 (two) times daily.      Marland Kitchen glucose blood (ACCU-CHEK AVIVA PLUS) test strip Test blood sugar once daily. Dx: E11.9 100 each 2  . lisinopril-hydrochlorothiazide (PRINZIDE,ZESTORETIC) 10-12.5 MG tablet Take 1 tablet by mouth daily. 90 tablet 3  . metFORMIN (GLUCOPHAGE) 500 MG tablet TAKE 1 TABLET (500 MG TOTAL) BY MOUTH 2 (TWO) TIMES DAILY WITH A MEAL. 180 tablet 1  . rosuvastatin (CRESTOR) 40 MG tablet Take 1 tablet (40 mg total) by mouth daily. 90 tablet 3   No current facility-administered medications on file prior to visit.    Allergies  Allergen Reactions  . Contrast  Media [Iodinated Diagnostic Agents]   . Iohexol Other (See Comments)    " made me feel like I was burning inside"   Family History  Problem Relation Age of Onset  . Colon polyps Mother        brothers x2  . Clotting disorder Mother        brother x 2, MGM  . Heart disease Brother        mother, MGM  . Diabetes Brother        x 2, MGM  . Stroke Maternal Grandmother   . Colon cancer Neg Hx   . Breast cancer Neg Hx    Social History   Socioeconomic History  . Marital status: Married    Spouse name: Not on file  . Number of children: 2  . Years of education: Not on file  . Highest education level: Not on file  Occupational History  . Occupation: behavioral health  Social Needs  . Financial resource strain: Not on file  . Food insecurity:    Worry: Not on file    Inability:  Not on file  . Transportation needs:    Medical: Not on file    Non-medical: Not on file  Tobacco Use  . Smoking status: Former Smoker    Packs/day: 0.20    Years: 1.00    Pack years: 0.20    Types: Cigarettes    Last attempt to quit: 12/11/1968    Years since quitting: 49.9  . Smokeless tobacco: Never Used  Substance and Sexual Activity  . Alcohol use: No  . Drug use: No  . Sexual activity: Not on file  Lifestyle  . Physical activity:    Days per week: Not on file    Minutes per session: Not on file  . Stress: Not on file  Relationships  . Social connections:    Talks on phone: Not on file    Gets together: Not on file    Attends religious service: Not on file    Active member of club or organization: Not on file    Attends meetings of clubs or organizations: Not on file    Relationship status: Not on file  Other Topics Concern  . Not on file  Social History Narrative  . Not on file   Depression screen [facility] 2/9 09/23/2018 04/19/2018 02/13/2018 07/12/2017 03/24/2017  Decreased Interest 0 0 0 0 0  Down, Depressed, Hopeless 0 0 0 0 0  PHQ - 2 Score 0 0 0 0 0    ROS As noted in HPI  Objective:  BP 123/81 (BP Location: Right Arm, Patient Position: Sitting, Cuff Size: Large)   Pulse 77   Temp 98.6 F (37 C) (Oral)   Resp 16   Ht 5' 3.25" (1.607 m)   Wt 220 lb (99.8 kg)   SpO2 95%   BMI 38.66 kg/m  Physical Exam Constitutional:      General: She is not in acute distress.    Appearance: She is well-developed. She is not diaphoretic.  HENT:     Head: Normocephalic and atraumatic.     Right Ear: External ear normal.     Left Ear: External ear normal.  Eyes:     General: No scleral icterus.    Conjunctiva/sclera: Conjunctivae normal.  Neck:     Musculoskeletal: Normal range of motion and neck supple.     Thyroid: No thyromegaly.  Cardiovascular:     Rate and Rhythm: Normal rate and regular  rhythm.     Heart sounds: Normal heart sounds.  Pulmonary:     Effort:  Pulmonary effort is normal. No respiratory distress.     Breath sounds: Normal breath sounds.  Lymphadenopathy:     Cervical: No cervical adenopathy.  Skin:    General: Skin is warm and dry.     Findings: No erythema.  Neurological:     Mental Status: She is alert and oriented to person, place, and time.  Psychiatric:        Behavior: Behavior normal.     Diabetic Foot Exam - Simple   Simple Foot Form Diabetic Foot exam was performed with the following findings:  Yes 11/23/2018  8:55 AM  Visual Inspection No deformities, no ulcerations, no other skin breakdown bilaterally:  Yes Sensation Testing Intact to touch and monofilament testing bilaterally:  Yes Pulse Check Posterior Tibialis and Dorsalis pulse intact bilaterally:  Yes Comments     POC TESTING Office Visit on 11/23/2018  Component Date Value Ref Range Status  . Glucose 11/23/2018 98  65 - 99 mg/dL Final  . BUN 11/23/2018 18  8 - 27 mg/dL Final  . Creatinine, Ser 11/23/2018 1.18* 0.57 - 1.00 mg/dL Final  . GFR calc non Af Amer 11/23/2018 49* >59 mL/min/1.73 Final  . GFR calc Af Amer 11/23/2018 56* >59 mL/min/1.73 Final  . BUN/Creatinine Ratio 11/23/2018 15  12 - 28 Final  . Sodium 11/23/2018 143  134 - 144 mmol/L Final  . Potassium 11/23/2018 4.5  3.5 - 5.2 mmol/L Final  . Chloride 11/23/2018 101  96 - 106 mmol/L Final  . CO2 11/23/2018 25  20 - 29 mmol/L Final  . Calcium 11/23/2018 10.3  8.7 - 10.3 mg/dL Final  . Total Protein 11/23/2018 7.5  6.0 - 8.5 g/dL Final  . Albumin 11/23/2018 4.3  3.6 - 4.8 g/dL Final  . Globulin, Total 11/23/2018 3.2  1.5 - 4.5 g/dL Final  . Albumin/Globulin Ratio 11/23/2018 1.3  1.2 - 2.2 Final  . Bilirubin Total 11/23/2018 <0.2  0.0 - 1.2 mg/dL Final  . Alkaline Phosphatase 11/23/2018 89  39 - 117 IU/L Final  . AST 11/23/2018 10  0 - 40 IU/L Final  . ALT 11/23/2018 10  0 - 32 IU/L Final  . Cholesterol, Total 11/23/2018 241* 100 - 199 mg/dL Final  . Triglycerides 11/23/2018 138  0  - 149 mg/dL Final  . HDL 11/23/2018 48  >39 mg/dL Final  . VLDL Cholesterol Cal 11/23/2018 28  5 - 40 mg/dL Final  . LDL Calculated 11/23/2018 165* 0 - 99 mg/dL Final  . Chol/HDL Ratio 11/23/2018 5.0* 0.0 - 4.4 ratio Final   Comment:                                   T. Chol/HDL Ratio                                             Men  Women                               1/2 Avg.Risk  3.4    3.3  Avg.Risk  5.0    4.4                                2X Avg.Risk  9.6    7.1                                3X Avg.Risk 23.4   11.0   . WBC 11/23/2018 7.5  3.4 - 10.8 x10E3/uL Final  . RBC 11/23/2018 5.20  3.77 - 5.28 x10E6/uL Final  . Hemoglobin 11/23/2018 11.0* 11.1 - 15.9 g/dL Final  . Hematocrit 11/23/2018 37.5  34.0 - 46.6 % Final  . MCV 11/23/2018 72* 79 - 97 fL Final  . MCH 11/23/2018 21.2* 26.6 - 33.0 pg Final  . MCHC 11/23/2018 29.3* 31.5 - 35.7 g/dL Final  . RDW 11/23/2018 16.0* 12.3 - 15.4 % Final   Comment: **Effective December 16, 2018, the RDW pediatric reference**   interval will be removed and the adult reference interval   will be changing to:                             Female 11.7 - 15.4                                                      Female 11.6 - 15.4   . Platelets 11/23/2018 372  150 - 450 x10E3/uL Final  . Neutrophils 11/23/2018 60  Not Estab. % Final  . Lymphs 11/23/2018 32  Not Estab. % Final  . Monocytes 11/23/2018 4  Not Estab. % Final  . Eos 11/23/2018 3  Not Estab. % Final  . Basos 11/23/2018 1  Not Estab. % Final  . Neutrophils Absolute 11/23/2018 4.5  1.4 - 7.0 x10E3/uL Final  . Lymphocytes Absolute 11/23/2018 2.4  0.7 - 3.1 x10E3/uL Final  . Monocytes Absolute 11/23/2018 0.3  0.1 - 0.9 x10E3/uL Final  . EOS (ABSOLUTE) 11/23/2018 0.2  0.0 - 0.4 x10E3/uL Final  . Basophils Absolute 11/23/2018 0.1  0.0 - 0.2 x10E3/uL Final  . Immature Granulocytes 11/23/2018 0  Not Estab. % Final  . Immature Grans (Abs) 11/23/2018 0.0  0.0 - 0.1  x10E3/uL Final  . Retic Ct Pct 11/23/2018 1.3  0.6 - 2.6 % Final  . Hemoglobin F Quantitation 11/23/2018 0.0  0.0 - 2.0 % Final  . Hgb A 11/23/2018 98.1  96.4 - 98.8 % Final  . HGB S 11/23/2018 0.0  0.0 % Final  . HGB C 11/23/2018 0.0  0.0 % Final  . Hemoglobin A2 Quantitation 11/23/2018 1.9  1.8 - 3.2 % Final  . HGB VARIANT 11/23/2018 0.0  0.0 % Final  . HGB INTERPRETATION 11/23/2018 Comment   Final   Normal adult hemoglobin present.  Marland Kitchen TSH 11/23/2018 1.090  0.450 - 4.500 uIU/mL Final  . Hgb A1c MFr Bld 11/23/2018 6.4* 4.8 - 5.6 % Final   Comment:          Prediabetes: 5.7 - 6.4          Diabetes: >6.4          Glycemic control for adults with diabetes: <7.0   . Est. average glucose  Bld gHb Est-m* 11/23/2018 137  mg/dL Final     Assessment & Plan:   1. Essential hypertension   2. Type 2 diabetes mellitus with retinopathy, without long-term current use of insulin, macular edema presence unspecified, unspecified laterality, unspecified retinopathy severity ([place])   3. Thyroid disease   4. Sarcoidosis   5. Class 2 severe obesity due to excess calories with serious comorbidity and body mass index (BMI) of 38.0 to 38.9 in adult ([place])   6. Pure hypercholesterolemia   7. Anemia, unspecified type   8. Immunization due   9. Gastroesophageal reflux disease, esophagitis presence not specified   10. Diabetes mellitus type 2 without retinopathy ([place])     Patient will continue on current chronic medications other than changes noted above, so ok to refill when needed.   See after visit summary for patient specific instructions.  Orders Placed This Encounter  Procedures  . Pneumococcal conjugate vaccine 13-valent IM  . Flu vaccine HIGH DOSE PF (Fluzone High dose)  . Comprehensive metabolic panel    Order Specific Question:   Has the patient fasted?    Answer:   Yes  . Lipid panel    Order Specific Question:   Has the patient fasted?    Answer:   Yes  . CBC with Differential/Platelet    . Reticulocytes  . Hemoglobinopathy evaluation  . TSH  . Hemoglobin A1c  . HM DIABETES FOOT EXAM    Meds ordered this encounter  Medications  . omeprazole (PRILOSEC) 40 MG capsule    Sig: Take 1 capsule (40 mg total) by mouth daily. 30-60 minutes before breakfast    Dispense:  30 capsule    Refill:  2  . famotidine (PEPCID) 40 MG tablet    Sig: Take 1 tablet (40 mg total) by mouth 2 (two) times daily as needed for heartburn or indigestion.    Dispense:  60 tablet    Refill:  2  . lisinopril-hydrochlorothiazide (PRINZIDE,ZESTORETIC) 10-12.5 MG tablet    Sig: Take 1 tablet by mouth daily.    Dispense:  90 tablet    Refill:  3  . metFORMIN (GLUCOPHAGE) 500 MG tablet    Sig: Take 1 tablet (500 mg total) by mouth 2 (two) times daily with a meal.    Dispense:  180 tablet    Refill:  1  . rosuvastatin (CRESTOR) 40 MG tablet    Sig: Take 1 tablet (40 mg total) by mouth daily.    Dispense:  90 tablet    Refill:  3   Sched CPE in 3 mos.  Patient verbalized to me that they understand the following: diagnosis, what is being done for them, what to expect and what should be done at home.  Their questions have been answered. They understand that I am unable to predict every possible medication interaction or adverse outcome and that if any unexpected symptoms arise, they should contact us and their pharmacist, as well as never hesitate to seek urgent/emergent care at [facility] or ER if they think it might be warranted.     [name], MD, MPH Primary Care at [facility] [place], [place]  [phone] Office phone  [phone] Office fax  11/23/18 1:40 AM

## 2018-11-24 LAB — HEMOGLOBIN A1C
ESTIMATED AVERAGE GLUCOSE: 137 mg/dL
HEMOGLOBIN A1C: 6.4 % — AB (ref 4.8–5.6)

## 2018-11-27 LAB — HEMOGLOBINOPATHY EVALUATION
HGB C: 0 %
HGB S: 0 %
HGB VARIANT: 0 %
Hemoglobin A2 Quantitation: 1.9 % (ref 1.8–3.2)
Hemoglobin F Quantitation: 0 % (ref 0.0–2.0)
Hgb A: 98.1 % (ref 96.4–98.8)

## 2018-11-27 LAB — COMPREHENSIVE METABOLIC PANEL
ALBUMIN: 4.3 g/dL (ref 3.6–4.8)
ALT: 10 IU/L (ref 0–32)
AST: 10 IU/L (ref 0–40)
Albumin/Globulin Ratio: 1.3 (ref 1.2–2.2)
Alkaline Phosphatase: 89 IU/L (ref 39–117)
BUN/Creatinine Ratio: 15 (ref 12–28)
BUN: 18 mg/dL (ref 8–27)
Bilirubin Total: <0.2 mg/dL (ref 0.0–1.2)
CALCIUM: 10.3 mg/dL (ref 8.7–10.3)
CO2: 25 mmol/L (ref 20–29)
CREATININE: 1.18 mg/dL — AB (ref 0.57–1.00)
Chloride: 101 mmol/L (ref 96–106)
GFR calc Af Amer: 56 mL/min/{1.73_m2} — ABNORMAL LOW (ref >59)
GFR, EST NON AFRICAN AMERICAN: 49 mL/min/{1.73_m2} — AB (ref >59)
GLOBULIN, TOTAL: 3.2 g/dL (ref 1.5–4.5)
GLUCOSE: 98 mg/dL (ref 65–99)
Potassium: 4.5 mmol/L (ref 3.5–5.2)
SODIUM: 143 mmol/L (ref 134–144)
Total Protein: 7.5 g/dL (ref 6.0–8.5)

## 2018-11-27 LAB — CBC WITH DIFFERENTIAL/PLATELET
BASOS ABS: 0.1 10*3/uL (ref 0.0–0.2)
Basos: 1 %
EOS (ABSOLUTE): 0.2 10*3/uL (ref 0.0–0.4)
Eos: 3 %
Hematocrit: 37.5 % (ref 34.0–46.6)
Hemoglobin: 11 g/dL — ABNORMAL LOW (ref 11.1–15.9)
IMMATURE GRANS (ABS): 0 10*3/uL (ref 0.0–0.1)
Immature Granulocytes: 0 %
LYMPHS ABS: 2.4 10*3/uL (ref 0.7–3.1)
LYMPHS: 32 %
MCH: 21.2 pg — ABNORMAL LOW (ref 26.6–33.0)
MCHC: 29.3 g/dL — AB (ref 31.5–35.7)
MCV: 72 fL — AB (ref 79–97)
MONOS ABS: 0.3 10*3/uL (ref 0.1–0.9)
Monocytes: 4 %
NEUTROS ABS: 4.5 10*3/uL (ref 1.4–7.0)
Neutrophils: 60 %
PLATELETS: 372 10*3/uL (ref 150–450)
RBC: 5.2 x10E6/uL (ref 3.77–5.28)
RDW: 16 % — AB (ref 12.3–15.4)
WBC: 7.5 10*3/uL (ref 3.4–10.8)

## 2018-11-27 LAB — LIPID PANEL
CHOL/HDL RATIO: 5 ratio — AB (ref 0.0–4.4)
Cholesterol, Total: 241 mg/dL — ABNORMAL HIGH (ref 100–199)
HDL: 48 mg/dL (ref >39)
LDL CALC: 165 mg/dL — AB (ref 0–99)
TRIGLYCERIDES: 138 mg/dL (ref 0–149)
VLDL CHOLESTEROL CAL: 28 mg/dL (ref 5–40)

## 2018-11-27 LAB — RETICULOCYTES: RETIC CT PCT: 1.3 % (ref 0.6–2.6)

## 2018-11-27 LAB — TSH: TSH: 1.09 u[IU]/mL (ref 0.450–4.500)

## 2018-12-17 DIAGNOSIS — H401132 Primary open-angle glaucoma, bilateral, moderate stage: Secondary | ICD-10-CM | POA: Diagnosis not present

## 2018-12-23 ENCOUNTER — Encounter: Payer: Self-pay | Admitting: Emergency Medicine

## 2018-12-23 ENCOUNTER — Ambulatory Visit (INDEPENDENT_AMBULATORY_CARE_PROVIDER_SITE_OTHER): Payer: Medicare HMO | Admitting: Emergency Medicine

## 2018-12-23 ENCOUNTER — Other Ambulatory Visit: Payer: Self-pay

## 2018-12-23 ENCOUNTER — Ambulatory Visit (INDEPENDENT_AMBULATORY_CARE_PROVIDER_SITE_OTHER): Payer: Medicare HMO

## 2018-12-23 VITALS — BP 116/73 | HR 91 | Temp 98.5°F | Resp 16 | Ht 64.0 in | Wt 217.6 lb

## 2018-12-23 DIAGNOSIS — M791 Myalgia, unspecified site: Secondary | ICD-10-CM | POA: Diagnosis not present

## 2018-12-23 DIAGNOSIS — M25511 Pain in right shoulder: Secondary | ICD-10-CM

## 2018-12-23 DIAGNOSIS — M7918 Myalgia, other site: Secondary | ICD-10-CM

## 2018-12-23 DIAGNOSIS — M62838 Other muscle spasm: Secondary | ICD-10-CM

## 2018-12-23 DIAGNOSIS — S46811A Strain of other muscles, fascia and tendons at shoulder and upper arm level, right arm, initial encounter: Secondary | ICD-10-CM

## 2018-12-23 MED ORDER — DICLOFENAC SODIUM 75 MG PO TBEC: 75.0000 mg | DELAYED_RELEASE_TABLET | Freq: Two times a day (BID) | ORAL | 0 refills | Status: AC

## 2018-12-23 MED ORDER — CYCLOBENZAPRINE HCL 10 MG PO TABS: 10.0000 mg | ORAL_TABLET | Freq: Every day | ORAL | 0 refills | Status: DC

## 2018-12-23 NOTE — Patient Instructions (Addendum)
If you have lab work done today you will be contacted with your lab results within the next 2 weeks.  If you have not heard from [place] then please contact us. The fastest way to get your results is to register for My Chart.   IF you received an x-ray today, you will receive an invoice from [facility]. Please contact [facility] at [phone] with questions or concerns regarding your invoice.   IF you received labwork today, you will receive an invoice from [place]. Please contact [facility] at [phone] with questions or concerns regarding your invoice.   Our billing staff will not be able to assist you with questions regarding bills from these companies.  You will be contacted with the lab results as soon as they are available. The fastest way to get your results is to activate your My Chart account. Instructions are located on the last page of this paperwork. If you have not heard from [place] regarding the results in 2 weeks, please contact this office.     Muscle Pain, Adult Muscle pain (myalgia) may be mild or severe. In most cases, the pain lasts only a short time and it goes away without treatment. It is normal to feel some muscle pain after starting a workout program. Muscles that have not been used often will be sore at first. Muscle pain may also be caused by many other things, including:  Overuse or muscle strain, especially if you are not in shape. This is the most common cause of muscle pain.  Injury.  Bruises.  Viruses, such as the flu.  Infectious diseases.  A chronic condition that causes muscle tenderness, fatigue, and headache (fibromyalgia).  A condition, such as lupus, in which the body's disease-fighting system attacks other organs in the body (autoimmune or rheumatologic diseases).  Certain drugs, including ACE inhibitors and statins. To diagnose the cause of your muscle pain, your health care provider will do a physical exam and ask  questions about the pain and when it began. If you have not had muscle pain for very long, your health care provider may want to wait before doing much testing. If your muscle pain has lasted a long time, your health care provider may want to run tests right away. In some cases, this may include tests to rule out certain conditions or illnesses. Treatment for muscle pain depends on the cause. Home care is often enough to relieve muscle pain. Your health care provider may also prescribe anti-inflammatory medicine. Follow these instructions at home: Activity  If overuse is causing your muscle pain: ? Slow down your activities until the pain goes away. ? Do regular, gentle exercises if you are not usually active. ? Warm up before exercising. Stretch before and after exercising. This can help lower the risk of muscle pain.  Do not continue working out if the pain is very bad. Bad pain could mean that you have injured a muscle. Managing pain and discomfort   If directed, apply ice to the sore muscle: ? Put ice in a plastic bag. ? Place a towel between your skin and the bag. ? Leave the ice on for 20 minutes, 2-3 times a day.  You may also alternate between applying ice and applying heat as told by your health care provider. To apply heat, use the heat source that your health care provider recommends, such as a moist heat pack or a heating pad. ? Place a towel between your skin and the  heat source. ? Leave the heat on for 20-30 minutes. ? Remove the heat if your skin turns bright red. This is especially important if you are unable to feel pain, heat, or cold. You may have a greater risk of getting burned. Medicines  Take over-the-counter and prescription medicines only as told by your health care provider.  Do not drive or use heavy machinery while taking prescription pain medicine. Contact a health care provider if:  Your muscle pain gets worse and medicines do not help.  You have muscle  pain that lasts longer than 3 days.  You have a rash or fever along with muscle pain.  You have muscle pain after a tick bite.  You have muscle pain while working out, even though you are in good physical condition.  You have redness, soreness, or swelling along with muscle pain.  You have muscle pain after starting a new medicine or changing the dose of a medicine. Get help right away if:  You have trouble breathing.  You have trouble swallowing.  You have muscle pain along with a stiff neck, fever, and vomiting.  You have severe muscle weakness or cannot move part of your body. This information is not intended to replace advice given to you by your health care provider. Make sure you discuss any questions you have with your health care provider. Document Released: 10/19/2006 Document Revised: 06/16/2016 Document Reviewed: 04/18/2016 Elsevier Interactive Patient Education  2019 [employer]  

## 2018-12-23 NOTE — Progress Notes (Signed)
Eileen Jefferson 68 y.o.   Chief Complaint  Patient presents with  . Shoulder Injury    RIGHT to upper right area of back x 3-4 days    HISTORY OF PRESENT ILLNESS: This is a 68 y.o. female complaining of pain to the right shoulder area that started 3 to 4 days ago.  Denies direct injury.  Denies neurological symptoms down the arm.  No other associated symptoms.  HPI   Prior to Admission medications   Medication Sig Start Date End Date Taking? Authorizing Provider  aspirin EC 81 MG tablet Take 81 mg by mouth daily.   Yes [provider]  brimonidine-timolol (COMBIGAN) 0.2-0.5 % ophthalmic solution Place 1 drop into the left eye 2 (two) times daily.     Yes [provider]  brinzolamide (AZOPT) 1 % ophthalmic suspension Place 1 drop into the left eye 2 (two) times daily.     Yes [provider]  famotidine (PEPCID) 40 MG tablet Take 1 tablet (40 mg total) by mouth 2 (two) times daily as needed for heartburn or indigestion. 11/23/18  Yes Shawnee Knapp, MD  lisinopril-hydrochlorothiazide (PRINZIDE,ZESTORETIC) 10-12.5 MG tablet Take 1 tablet by mouth daily. 11/23/18  Yes Shawnee Knapp, MD  metFORMIN (GLUCOPHAGE) 500 MG tablet Take 1 tablet (500 mg total) by mouth 2 (two) times daily with a meal. 11/23/18  Yes Shawnee Knapp, MD  omeprazole (PRILOSEC) 40 MG capsule Take 1 capsule (40 mg total) by mouth daily. 30-60 minutes before breakfast 11/23/18  Yes Shawnee Knapp, MD  rosuvastatin (CRESTOR) 40 MG tablet Take 1 tablet (40 mg total) by mouth daily. 11/23/18  Yes Shawnee Knapp, MD  ACCU-CHEK SOFTCLIX LANCETS lancets Test blood sugar once daily. Dx: E11.9 10/25/16   Shawnee Knapp, MD  Alcohol Swabs PADS Test blood sugar once daily. Dx: E11.9 10/25/16   Shawnee Knapp, MD  Blood Glucose Calibration (ACCU-CHEK AVIVA) SOLN Test blood sugar once daily. Dx: E11.9 10/25/16   Shawnee Knapp, MD  Blood Glucose Monitoring Suppl (ACCU-CHEK AVIVA PLUS) w/Device KIT Test blood sugar once daily.  Dx: E11.9 10/25/16   Shawnee Knapp, MD  glucose blood (ACCU-CHEK AVIVA PLUS) test strip Test blood sugar once daily. Dx: E11.9 10/25/16   Shawnee Knapp, MD    Allergies  Allergen Reactions  . Contrast Media [Iodinated Diagnostic Agents]   . Iohexol Other (See Comments)    " made me feel like I was burning inside"    Patient Active Problem List   Diagnosis Date Noted  . Gastroesophageal reflux disease 11/23/2018  . DDD (degenerative disc disease), cervical 07/11/2017  . Localized osteoarthrosis of right shoulder region 07/11/2017  . Anemia 07/11/2017  . Hyperlipidemia 06/28/2016  . Thyroid disease 06/28/2016  . Diabetic retinopathy ([place]) 05/05/2015  . Obesity 01/11/2015  . Glaucoma 04/06/2014  . Macular degeneration 04/06/2014  . 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 HTN (hypertension) 10/02/2013  . Sarcoidosis 10/02/2013  . Diabetes mellitus, type 2 ([place]) 10/02/2013  . OSA (obstructive sleep apnea) 10/23/2012    Past Medical History:  Diagnosis Date  . DDD (degenerative disc disease), cervical 07/11/2017   C5-6 and C6-7 cervical spondylosis and degenerative disc disease on 2005 Xray  . Diverticulosis   . Glaucoma 04/06/2014  . HTN (hypertension)   . Hyperlipidemia   . 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 Iron deficiency anemia   . Pulmonary sarcoidosis ([place])   . Sleep apnea   .  Thyroid disease   . 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)     Past Surgical History:  Procedure Laterality Date  . APPENDECTOMY    . c-section     x 2  . CATARACT EXTRACTION     bilateral  . SCLERAL BUCKLE     right  . TONSILLECTOMY    . TOTAL ABDOMINAL HYSTERECTOMY    . TUBAL LIGATION    . VITRECTOMY     bilateral    Social History   Socioeconomic History  . Marital status: Married    Spouse name: Not on file  . Number of children: 2  . Years of education: Not on file  . Highest education level: Not  on file  Occupational History  . Occupation: behavioral health  Social Needs  . Financial resource strain: Not on file  . Food insecurity:    Worry: Not on file    Inability: Not on file  . Transportation needs:    Medical: Not on file    Non-medical: Not on file  Tobacco Use  . Smoking status: Former Smoker    Packs/day: 0.20    Years: 1.00    Pack years: 0.20    Types: Cigarettes    Last attempt to quit: 12/11/1968    Years since quitting: 50.0  . Smokeless tobacco: Never Used  Substance and Sexual Activity  . Alcohol use: No  . Drug use: No  . Sexual activity: Not on file  Lifestyle  . Physical activity:    Days per week: Not on file    Minutes per session: Not on file  . Stress: Not on file  Relationships  . Social connections:    Talks on phone: Not on file    Gets together: Not on file    Attends religious service: Not on file    Active member of club or organization: Not on file    Attends meetings of clubs or organizations: Not on file    Relationship status: Not on file  . Intimate partner violence:    Fear of current or ex partner: Not on file    Emotionally abused: Not on file    Physically abused: Not on file    Forced sexual activity: Not on file  Other Topics Concern  . Not on file  Social History Narrative  . Not on file    Family History  Problem Relation Age of Onset  . Colon polyps Mother        brothers x2  . Clotting disorder Mother        brother x 2, MGM  . Heart disease Brother        mother, MGM  . Diabetes Brother        x 2, MGM  . Stroke Maternal Grandmother   . Colon cancer Neg Hx   . Breast cancer Neg Hx      Review of Systems  Constitutional: Negative.  Negative for chills and fever.  Respiratory: Negative.  Negative for cough and shortness of breath.   Cardiovascular: Negative for chest pain and palpitations.  Gastrointestinal: Negative for nausea and vomiting.  Genitourinary: Negative.  Negative for dysuria.    Musculoskeletal:       Right shoulder/upper back pain.  Skin: Negative.  Negative for rash.  Neurological: Negative.  Negative for dizziness and headaches.  Endo/Heme/Allergies: Negative.   All other systems reviewed and are negative.   Vitals:   12/23/18 1029  BP: 116/73  Pulse: 91  Resp: 16  Temp: 98.5 F (36.9 C)  SpO2: 94%    Physical Exam Vitals signs reviewed.  Constitutional:      Appearance: Normal appearance.  HENT:     Head: Normocephalic and atraumatic.  Eyes:     Extraocular Movements: Extraocular movements intact.     Pupils: Pupils are equal, round, and reactive to light.  Neck:     Musculoskeletal: Normal range of motion.  Cardiovascular:     Rate and Rhythm: Normal rate and regular rhythm.     Heart sounds: Normal heart sounds.  Pulmonary:     Effort: Pulmonary effort is normal.     Breath sounds: Normal breath sounds.  Musculoskeletal:     Comments: Right shoulder: Pretty good range of motion.  No localized tenderness.  More tenderness to right trapezius muscle.  Rest of the extremities within normal limits.  Skin:    General: Skin is warm and dry.     Capillary Refill: Capillary refill takes less than 2 seconds.  Neurological:     General: No focal deficit present.     Mental Status: She is alert and oriented to person, place, and time.     Sensory: No sensory deficit.     Motor: No weakness.     Coordination: Coordination normal.  Psychiatric:        Mood and Affect: Mood normal.        Behavior: Behavior normal.   Dg Shoulder Right  Result Date: 12/23/2018 CLINICAL DATA:  Acute right shoulder pain. EXAM: RIGHT SHOULDER - 2+ VIEW COMPARISON:  Right shoulder x-rays dated March 24, 2017. FINDINGS: No acute fracture or dislocation. Mild acromioclavicular joint space narrowing with small marginal osteophytes. The glenohumeral joint space is preserved. Soft tissues are unremarkable. The visualized right lung is clear. IMPRESSION: 1.  No acute  osseous abnormality. 2. Unchanged mild acromioclavicular osteoarthritis. Electronically Signed   By: Titus Dubin M.D.   On: 12/23/2018 10:57    A total of 25 minutes was spent in the room with the patient, greater than 50% of which was in counseling/coordination of care regarding differential diagnosis, treatment, medications, and need for follow-up if no better or worse.   ASSESSMENT & PLAN: Eileen Jefferson was seen today for shoulder injury.  Diagnoses and all orders for this visit:  Acute pain of right shoulder -     DG Shoulder Right; Future  Muscle pain -     diclofenac (VOLTAREN) 75 MG EC tablet; Take 1 tablet (75 mg total) by mouth 2 (two) times daily for 5 days. After 5 days take as needed. -     cyclobenzaprine (FLEXERIL) 10 MG tablet; Take 1 tablet (10 mg total) by mouth at bedtime.  Strain of right trapezius muscle, initial encounter  Muscle spasm -     cyclobenzaprine (FLEXERIL) 10 MG tablet; Take 1 tablet (10 mg total) by mouth at bedtime.  Musculoskeletal pain -     diclofenac (VOLTAREN) 75 MG EC tablet; Take 1 tablet (75 mg total) by mouth 2 (two) times daily for 5 days. After 5 days take as needed.    Patient Instructions       If you have lab work done today you will be contacted with your lab results within the next 2 weeks.  If you have not heard from [place] then please contact us. The fastest way to get your results is to register for My Chart.   IF you received an  x-ray today, you will receive an invoice from [facility]. Please contact [facility] at [phone] with questions or concerns regarding your invoice.   IF you received labwork today, you will receive an invoice from [facility]. Please contact [facility] at [phone] with questions or concerns regarding your invoice.   Our billing staff will not be able to assist you with questions regarding bills from these companies.  You will be contacted with the lab results as soon as they are  available. The fastest way to get your results is to activate your My Chart account. Instructions are located on the last page of this paperwork. If you have not heard from [place] regarding the results in 2 weeks, please contact this office.     Muscle Pain, Adult Muscle pain (myalgia) may be mild or severe. In most cases, the pain lasts only a short time and it goes away without treatment. It is normal to feel some muscle pain after starting a workout program. Muscles that have not been used often will be sore at first. Muscle pain may also be caused by many other things, including:  Overuse or muscle strain, especially if you are not in shape. This is the most common cause of muscle pain.  Injury.  Bruises.  Viruses, such as the flu.  Infectious diseases.  A chronic condition that causes muscle tenderness, fatigue, and headache (fibromyalgia).  A condition, such as lupus, in which the body's disease-fighting system attacks other organs in the body (autoimmune or rheumatologic diseases).  Certain drugs, including ACE inhibitors and statins. To diagnose the cause of your muscle pain, your health care provider will do a physical exam and ask questions about the pain and when it began. If you have not had muscle pain for very long, your health care provider may want to wait before doing much testing. If your muscle pain has lasted a long time, your health care provider may want to run tests right away. In some cases, this may include tests to rule out certain conditions or illnesses. Treatment for muscle pain depends on the cause. Home care is often enough to relieve muscle pain. Your health care provider may also prescribe anti-inflammatory medicine. Follow these instructions at home: Activity  If overuse is causing your muscle pain: ? Slow down your activities until the pain goes away. ? Do regular, gentle exercises if you are not usually active. ? Warm up before exercising. Stretch before  and after exercising. This can help lower the risk of muscle pain.  Do not continue working out if the pain is very bad. Bad pain could mean that you have injured a muscle. Managing pain and discomfort   If directed, apply ice to the sore muscle: ? Put ice in a plastic bag. ? Place a towel between your skin and the bag. ? Leave the ice on for 20 minutes, 2-3 times a day.  You may also alternate between applying ice and applying heat as told by your health care provider. To apply heat, use the heat source that your health care provider recommends, such as a moist heat pack or a heating pad. ? Place a towel between your skin and the heat source. ? Leave the heat on for 20-30 minutes. ? Remove the heat if your skin turns bright red. This is especially important if you are unable to feel pain, heat, or cold. You may have a greater risk of getting burned. Medicines  Take over-the-counter and prescription medicines only as told  by your health care provider.  Do not drive or use heavy machinery while taking prescription pain medicine. Contact a health care provider if:  Your muscle pain gets worse and medicines do not help.  You have muscle pain that lasts longer than 3 days.  You have a rash or fever along with muscle pain.  You have muscle pain after a tick bite.  You have muscle pain while working out, even though you are in good physical condition.  You have redness, soreness, or swelling along with muscle pain.  You have muscle pain after starting a new medicine or changing the dose of a medicine. Get help right away if:  You have trouble breathing.  You have trouble swallowing.  You have muscle pain along with a stiff neck, fever, and vomiting.  You have severe muscle weakness or cannot move part of your body. This information is not intended to replace advice given to you by your health care provider. Make sure you discuss any questions you have with your health care  provider. Document Released: 10/19/2006 Document Revised: 06/16/2016 Document Reviewed: 04/18/2016 Elsevier Interactive Patient Education  2019 [employer]      Agustina Caroli, MD [facility]

## 2019-02-09 ENCOUNTER — Other Ambulatory Visit: Payer: Self-pay | Admitting: Emergency Medicine

## 2019-02-09 DIAGNOSIS — M62838 Other muscle spasm: Secondary | ICD-10-CM

## 2019-02-09 DIAGNOSIS — M791 Myalgia, unspecified site: Secondary | ICD-10-CM

## 2019-02-17 ENCOUNTER — Other Ambulatory Visit: Payer: Self-pay | Admitting: Emergency Medicine

## 2019-02-17 DIAGNOSIS — M62838 Other muscle spasm: Secondary | ICD-10-CM

## 2019-02-17 DIAGNOSIS — M791 Myalgia, unspecified site: Secondary | ICD-10-CM

## 2019-02-17 NOTE — Telephone Encounter (Signed)
Flexeril  Refill request  Was pt of Dr. Clelia Croft

## 2019-02-25 ENCOUNTER — Telehealth: Payer: Self-pay | Admitting: *Deleted

## 2019-02-25 NOTE — Telephone Encounter (Signed)
Faxed request refill for cyclobenzaprine 10 mg tablet to [facility] pharmacy, denied because patient needs an appointment for additional refill. Confirmation page at 7:45 am.

## 2019-03-06 ENCOUNTER — Telehealth: Payer: Self-pay | Admitting: *Deleted

## 2019-03-06 NOTE — Telephone Encounter (Signed)
Schedule AWV (telemed) 

## 2019-03-26 ENCOUNTER — Encounter: Payer: Medicare HMO | Admitting: Family Medicine

## 2019-05-20 ENCOUNTER — Telehealth: Payer: Self-pay | Admitting: *Deleted

## 2019-05-20 NOTE — Telephone Encounter (Signed)
Schedule AWV.  

## 2019-05-21 NOTE — Telephone Encounter (Signed)
Patient returning back call from [place] to schedule AWV. Please advise and call back.

## 2019-05-21 NOTE — Telephone Encounter (Signed)
Please call.

## 2019-05-22 ENCOUNTER — Telehealth: Payer: Self-pay | Admitting: *Deleted

## 2019-05-22 NOTE — Telephone Encounter (Signed)
Schedule AWV.  

## 2019-06-04 ENCOUNTER — Encounter: Payer: Self-pay | Admitting: Emergency Medicine

## 2019-06-04 ENCOUNTER — Other Ambulatory Visit: Payer: Self-pay

## 2019-06-04 ENCOUNTER — Ambulatory Visit (INDEPENDENT_AMBULATORY_CARE_PROVIDER_SITE_OTHER): Payer: Medicare HMO | Admitting: Emergency Medicine

## 2019-06-04 VITALS — BP 138/75 | HR 70 | Temp 99.0°F | Resp 16 | Ht 62.5 in | Wt 212.0 lb

## 2019-06-04 DIAGNOSIS — D869 Sarcoidosis, unspecified: Secondary | ICD-10-CM

## 2019-06-04 DIAGNOSIS — E11319 Type 2 diabetes mellitus with unspecified diabetic retinopathy without macular edema: Secondary | ICD-10-CM | POA: Diagnosis not present

## 2019-06-04 DIAGNOSIS — E079 Disorder of thyroid, unspecified: Secondary | ICD-10-CM | POA: Diagnosis not present

## 2019-06-04 DIAGNOSIS — E7800 Pure hypercholesterolemia, unspecified: Secondary | ICD-10-CM

## 2019-06-04 DIAGNOSIS — I10 Essential (primary) hypertension: Secondary | ICD-10-CM | POA: Diagnosis not present

## 2019-06-04 LAB — LIPID PANEL

## 2019-06-04 LAB — GLUCOSE, POCT (MANUAL RESULT ENTRY): POC Glucose: 88 mg/dl (ref 70–99)

## 2019-06-04 MED ORDER — FREESTYLE LIBRE SENSOR SYSTEM MISC: 1.0000 "application " | 3 refills | Status: DC | PRN

## 2019-06-04 MED ORDER — FREESTYLE LIBRE READER DEVI: 1.0000 "application " | 3 refills | Status: DC | PRN

## 2019-06-04 NOTE — Progress Notes (Signed)
BP Readings from Last 3 Encounters:  06/04/19 138/75  12/23/18 116/73  11/23/18 123/81   Lab Results  Component Value Date   HGBA1C 6.4 (H) 11/23/2018   Lab Results  Component Value Date   CHOL 241 (H) 11/23/2018   HDL 48 11/23/2018   LDLCALC 165 (H) 11/23/2018   TRIG 138 11/23/2018   CHOLHDL 5.0 (H) 11/23/2018   The 10-year ASCVD risk score Eileen Bussing DC Jr., et al., 2013) is: 29.9%   Values used to calculate the score:     Age: 68 years     Sex: Female     Is Non-Hispanic African American: Yes     Diabetic: Yes     Tobacco smoker: No     Systolic Blood Pressure: 220 mmHg     Is BP treated: Yes     HDL Cholesterol: 48 mg/dL     Total Cholesterol: 241 mg/dL Eileen Jefferson 68 y.o.   Chief Complaint  Patient presents with  . Hypertension    follow up    HISTORY OF PRESENT ILLNESS: This is a 68 y.o. female here for follow-up of chronic medical problems. #1 hypertension, doing well, on Zestoretic 10-12.5 mg daily. #2 diabetes, on metformin 500 mg twice a day, doing well.  Monitors of blood sugar at home, within normal limits. #3 dyslipidemia, on Crestor 40 mg a day. #4 sarcoidosis, under control. #5 thyroid disease. Today she has no complaints or medical concerns. No flulike symptoms.  No known exposure to COVID-19 patients.  HPI   Prior to Admission medications   Medication Sig Start Date End Date Taking? Authorizing Provider  aspirin EC 81 MG tablet Take 81 mg by mouth daily.   Yes [provider]  brimonidine-timolol (COMBIGAN) 0.2-0.5 % ophthalmic solution Place 1 drop into the left eye 2 (two) times daily.     Yes [provider]  brinzolamide (AZOPT) 1 % ophthalmic suspension Place 1 drop into the left eye 2 (two) times daily.     Yes [provider]  famotidine (PEPCID) 40 MG tablet Take 1 tablet (40 mg total) by mouth 2 (two) times daily as needed for heartburn or indigestion. 11/23/18  Yes Shawnee Knapp, MD   lisinopril-hydrochlorothiazide (PRINZIDE,ZESTORETIC) 10-12.5 MG tablet Take 1 tablet by mouth daily. 11/23/18  Yes Shawnee Knapp, MD  metFORMIN (GLUCOPHAGE) 500 MG tablet Take 1 tablet (500 mg total) by mouth 2 (two) times daily with a meal. 11/23/18  Yes Shawnee Knapp, MD  omeprazole (PRILOSEC) 40 MG capsule Take 1 capsule (40 mg total) by mouth daily. 30-60 minutes before breakfast 11/23/18  Yes Shawnee Knapp, MD  rosuvastatin (CRESTOR) 40 MG tablet Take 1 tablet (40 mg total) by mouth daily. 11/23/18  Yes Shawnee Knapp, MD  ACCU-CHEK SOFTCLIX LANCETS lancets Test blood sugar once daily. Dx: E11.9 10/25/16   Shawnee Knapp, MD  Alcohol Swabs PADS Test blood sugar once daily. Dx: E11.9 10/25/16   Shawnee Knapp, MD  Blood Glucose Calibration (ACCU-CHEK AVIVA) SOLN Test blood sugar once daily. Dx: E11.9 10/25/16   Shawnee Knapp, MD  Blood Glucose Monitoring Suppl (ACCU-CHEK AVIVA PLUS) w/Device KIT Test blood sugar once daily. Dx: E11.9 10/25/16   Shawnee Knapp, MD  cyclobenzaprine (FLEXERIL) 10 MG tablet Take 1 tablet (10 mg total) by mouth at bedtime. Patient not taking: Reported on 06/04/2019 12/23/18   Horald Pollen, MD  glucose blood (ACCU-CHEK AVIVA PLUS) test strip Test blood sugar once daily.  Dx: E11.9 10/25/16   Shawnee Knapp, MD    Allergies  Allergen Reactions  . Contrast Media [Iodinated Diagnostic Agents]   . Iohexol Other (See Comments)    " made me feel like I was burning inside"    Patient Active Problem List   Diagnosis Date Noted  . Gastroesophageal reflux disease 11/23/2018  . DDD (degenerative disc disease), cervical 07/11/2017  . Localized osteoarthrosis of right shoulder region 07/11/2017  . Anemia 07/11/2017  . Hyperlipidemia 06/28/2016  . Thyroid disease 06/28/2016  . Diabetic retinopathy ([place]) 05/05/2015  . Obesity 01/11/2015  . Glaucoma 04/06/2014  . Macular degeneration 04/06/2014  . 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 HTN (hypertension) 10/02/2013   . Sarcoidosis 10/02/2013  . Diabetes mellitus, type 2 ([place]) 10/02/2013  . OSA (obstructive sleep apnea) 10/23/2012    Past Medical History:  Diagnosis Date  . DDD (degenerative disc disease), cervical 07/11/2017   C5-6 and C6-7 cervical spondylosis and degenerative disc disease on 2005 Xray  . Diverticulosis   . Glaucoma 04/06/2014  . HTN (hypertension)   . Hyperlipidemia   . 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 Iron deficiency anemia   . Pulmonary sarcoidosis ([place])   . Sleep apnea   . Thyroid disease   . 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)     Past Surgical History:  Procedure Laterality Date  . APPENDECTOMY    . c-section     x 2  . CATARACT EXTRACTION     bilateral  . SCLERAL BUCKLE     right  . TONSILLECTOMY    . TOTAL ABDOMINAL HYSTERECTOMY    . TUBAL LIGATION    . VITRECTOMY     bilateral    Social History   Socioeconomic History  . Marital status: Married    Spouse name: Not on file  . Number of children: 2  . Years of education: Not on file  . Highest education level: Not on file  Occupational History  . Occupation: behavioral health  Social Needs  . Financial resource strain: Not on file  . Food insecurity    Worry: Not on file    Inability: Not on file  . Transportation needs    Medical: Not on file    Non-medical: Not on file  Tobacco Use  . Smoking status: Former Smoker    Packs/day: 0.20    Years: 1.00    Pack years: 0.20    Types: Cigarettes    Quit date: 12/11/1968    Years since quitting: 50.5  . Smokeless tobacco: Never Used  Substance and Sexual Activity  . Alcohol use: No  . Drug use: No  . Sexual activity: Not on file  Lifestyle  . Physical activity    Days per week: Not on file    Minutes per session: Not on file  . Stress: Not on file  Relationships  . Social Herbalist on phone: Not on file    Gets together: Not on file    Attends religious  service: Not on file    Active member of club or organization: Not on file    Attends meetings of clubs or organizations: Not on file    Relationship status: Not on file  . Intimate partner violence    Fear of current or ex partner: Not on file    Emotionally abused: Not on file    Physically abused: Not  on file    Forced sexual activity: Not on file  Other Topics Concern  . Not on file  Social History Narrative  . Not on file    Family History  Problem Relation Age of Onset  . Colon polyps Mother        brothers x2  . Clotting disorder Mother        brother x 2, MGM  . Heart disease Brother        mother, MGM  . Diabetes Brother        x 2, MGM  . Stroke Maternal Grandmother   . Colon cancer Neg Hx   . Breast cancer Neg Hx      Review of Systems  Constitutional: Negative.  Negative for chills and fever.  HENT: Negative for congestion, sinus pain and sore throat.   Eyes: Negative.  Negative for blurred vision and double vision.  Respiratory: Negative.  Negative for cough and shortness of breath.   Cardiovascular: Negative.  Negative for chest pain and palpitations.  Gastrointestinal: Negative.  Negative for abdominal pain, blood in stool, diarrhea, melena, nausea and vomiting.  Genitourinary: Negative.  Negative for dysuria.  Skin: Negative.  Negative for rash.  Neurological: Negative.  Negative for dizziness and headaches.  Endo/Heme/Allergies: Negative.   All other systems reviewed and are negative.   Vitals:   06/04/19 0820  BP: 138/75  Pulse: 70  Resp: 16  Temp: 99 F (37.2 C)  SpO2: 97%    Physical Exam Vitals signs reviewed.  Constitutional:      Appearance: Normal appearance.  HENT:     Head: Normocephalic and atraumatic.  Eyes:     Extraocular Movements: Extraocular movements intact.     Pupils: Pupils are equal, round, and reactive to light.  Neck:     Musculoskeletal: Normal range of motion and neck supple.  Cardiovascular:     Rate and  Rhythm: Normal rate and regular rhythm.     Pulses: Normal pulses.     Heart sounds: Normal heart sounds.  Pulmonary:     Effort: Pulmonary effort is normal.     Breath sounds: Normal breath sounds. No wheezing, rhonchi or rales.  Abdominal:     Palpations: Abdomen is soft.     Tenderness: There is no abdominal tenderness.  Musculoskeletal: Normal range of motion.  Lymphadenopathy:     Cervical: No cervical adenopathy.  Skin:    General: Skin is warm and dry.     Capillary Refill: Capillary refill takes less than 2 seconds.  Neurological:     General: No focal deficit present.     Mental Status: She is alert and oriented to person, place, and time.     Sensory: No sensory deficit.     Motor: No weakness.  Psychiatric:        Mood and Affect: Mood normal.        Behavior: Behavior normal.      ASSESSMENT & PLAN: Clinically stable.  No medical concerns identified during this visit.  Continue present medications.  Follow-up in 6 months.  Eileen was seen today for hypertension.  Diagnoses and all orders for this visit:  Type 2 diabetes mellitus with retinopathy, without long-term current use of insulin, macular edema presence unspecified, unspecified laterality, unspecified retinopathy severity (HCC) -     POCT glucose (manual entry) -     CBC with Differential/Platelet -     Comprehensive metabolic panel -     Hemoglobin A1c -  Lipid panel -     Continuous Blood Gluc Receiver (FREESTYLE LIBRE READER) DEVI; 1 application by Does not apply route as needed. -     Continuous Blood Gluc Sensor ([place]) MISC; 1 application by Does not apply route as needed.  Essential hypertension -     CBC with Differential/Platelet -     Comprehensive metabolic panel  Sarcoidosis  Thyroid disease -     TSH  Pure hypercholesterolemia -     Lipid panel    Patient Instructions       If you have lab work done today you will be contacted with your lab  results within the next 2 weeks.  If you have not heard from [place] then please contact us. The fastest way to get your results is to register for My Chart.   IF you received an x-ray today, you will receive an invoice from [facility]. Please contact [facility] at [phone] with questions or concerns regarding your invoice.   IF you received labwork today, you will receive an invoice from [facility]. Please contact [facility] at [phone] with questions or concerns regarding your invoice.   Our billing staff will not be able to assist you with questions regarding bills from these companies.  You will be contacted with the lab results as soon as they are available. The fastest way to get your results is to activate your My Chart account. Instructions are located on the last page of this paperwork. If you have not heard from [place] regarding the results in 2 weeks, please contact this office.     Hypertension Hypertension, commonly called high blood pressure, is when the force of blood pumping through the arteries is too strong. The arteries are the blood vessels that carry blood from the heart throughout the body. Hypertension forces the heart to work harder to pump blood and may cause arteries to become narrow or stiff. Having untreated or uncontrolled hypertension can cause heart attacks, strokes, kidney disease, and other problems. A blood pressure reading consists of a higher number over a lower number. Ideally, your blood pressure should be below 120/80. The first ("top") number is called the systolic pressure. It is a measure of the pressure in your arteries as your heart beats. The second ("bottom") number is called the diastolic pressure. It is a measure of the pressure in your arteries as the heart relaxes. What are the causes? The cause of this condition is not known. What increases the risk? Some risk factors for high blood pressure are under your control. Others are not.  Factors you can change  Smoking.  Having type 2 diabetes mellitus, high cholesterol, or both.  Not getting enough exercise or physical activity.  Being overweight.  Having too much fat, sugar, calories, or salt (sodium) in your diet.  Drinking too much alcohol. Factors that are difficult or impossible to change  Having chronic kidney disease.  Having a family history of high blood pressure.  Age. Risk increases with age.  Race. You may be at higher risk if you are African-American.  Gender. Men are at higher risk than women before age 63. After age 67, women are at higher risk than men.  Having obstructive sleep apnea.  Stress. What are the signs or symptoms? Extremely high blood pressure (hypertensive crisis) may cause:  Headache.  Anxiety.  Shortness of breath.  Nosebleed.  Nausea and vomiting.  Severe chest pain.  Jerky movements you cannot control (seizures). How is  this diagnosed? This condition is diagnosed by measuring your blood pressure while you are seated, with your arm resting on a surface. The cuff of the blood pressure monitor will be placed directly against the skin of your upper arm at the level of your heart. It should be measured at least twice using the same arm. Certain conditions can cause a difference in blood pressure between your right and left arms. Certain factors can cause blood pressure readings to be lower or higher than normal (elevated) for a short period of time:  When your blood pressure is higher when you are in a health care provider's office than when you are at home, this is called white coat hypertension. Most people with this condition do not need medicines.  When your blood pressure is higher at home than when you are in a health care provider's office, this is called masked hypertension. Most people with this condition may need medicines to control blood pressure. If you have a high blood pressure reading during one visit or  you have normal blood pressure with other risk factors:  You may be asked to return on a different day to have your blood pressure checked again.  You may be asked to monitor your blood pressure at home for 1 week or longer. If you are diagnosed with hypertension, you may have other blood or imaging tests to help your health care provider understand your overall risk for other conditions. How is this treated? This condition is treated by making healthy lifestyle changes, such as eating healthy foods, exercising more, and reducing your alcohol intake. Your health care provider may prescribe medicine if lifestyle changes are not enough to get your blood pressure under control, and if:  Your systolic blood pressure is above 130.  Your diastolic blood pressure is above 80. Your personal target blood pressure may vary depending on your medical conditions, your age, and other factors. Follow these instructions at home: Eating and drinking   Eat a diet that is high in fiber and potassium, and low in sodium, added sugar, and fat. An example eating plan is called the DASH (Dietary Approaches to Stop Hypertension) diet. To eat this way: ? Eat plenty of fresh fruits and vegetables. Try to fill half of your plate at each meal with fruits and vegetables. ? Eat whole grains, such as whole wheat pasta, brown rice, or whole grain bread. Fill about one quarter of your plate with whole grains. ? Eat or drink low-fat dairy products, such as skim milk or low-fat yogurt. ? Avoid fatty cuts of meat, processed or cured meats, and poultry with skin. Fill about one quarter of your plate with lean proteins, such as fish, chicken without skin, beans, eggs, and tofu. ? Avoid premade and processed foods. These tend to be higher in sodium, added sugar, and fat.  Reduce your daily sodium intake. Most people with hypertension should eat less than 1,500 mg of sodium a day.  Limit alcohol intake to no more than 1 drink a  day for nonpregnant women and 2 drinks a day for men. One drink equals 12 oz of beer, 5 oz of wine, or 1 oz of hard liquor. Lifestyle   Work with your health care provider to maintain a healthy body weight or to lose weight. Ask what an ideal weight is for you.  Get at least 30 minutes of exercise that causes your heart to beat faster (aerobic exercise) most days of the week. Activities may include  walking, swimming, or biking.  Include exercise to strengthen your muscles (resistance exercise), such as pilates or lifting weights, as part of your weekly exercise routine. Try to do these types of exercises for 30 minutes at least 3 days a week.  Do not use any products that contain nicotine or tobacco, such as cigarettes and e-cigarettes. If you need help quitting, ask your health care provider.  Monitor your blood pressure at home as told by your health care provider.  Keep all follow-up visits as told by your health care provider. This is important. Medicines  Take over-the-counter and prescription medicines only as told by your health care provider. Follow directions carefully. Blood pressure medicines must be taken as prescribed.  Do not skip doses of blood pressure medicine. Doing this puts you at risk for problems and can make the medicine less effective.  Ask your health care provider about side effects or reactions to medicines that you should watch for. Contact a health care provider if:  You think you are having a reaction to a medicine you are taking.  You have headaches that keep coming back (recurring).  You feel dizzy.  You have swelling in your ankles.  You have trouble with your vision. Get help right away if:  You develop a severe headache or confusion.  You have unusual weakness or numbness.  You feel faint.  You have severe pain in your chest or abdomen.  You vomit repeatedly.  You have trouble breathing. Summary  Hypertension is when the force of  blood pumping through your arteries is too strong. If this condition is not controlled, it may put you at risk for serious complications.  Your personal target blood pressure may vary depending on your medical conditions, your age, and other factors. For most people, a normal blood pressure is less than 120/80.  Hypertension is treated with lifestyle changes, medicines, or a combination of both. Lifestyle changes include weight loss, eating a healthy, low-sodium diet, exercising more, and limiting alcohol. This information is not intended to replace advice given to you by your health care provider. Make sure you discuss any questions you have with your health care provider. Document Released: 11/27/2005 Document Revised: 10/25/2016 Document Reviewed: 10/25/2016 Elsevier Interactive Patient Education  2019 [employer]    Agustina Caroli, MD [facility]

## 2019-06-04 NOTE — Patient Instructions (Addendum)
   If you have lab work done today you will be contacted with your lab results within the next 2 weeks.  If you have not heard from us then please contact us. The fastest way to get your results is to register for My Chart.   IF you received an x-ray today, you will receive an invoice from [facility]. Please contact [facility] at [phone] with questions or concerns regarding your invoice.   IF you received labwork today, you will receive an invoice from [facility]. Please contact [facility] at [phone] with questions or concerns regarding your invoice.   Our billing staff will not be able to assist you with questions regarding bills from these companies.  You will be contacted with the lab results as soon as they are available. The fastest way to get your results is to activate your My Chart account. Instructions are located on the last page of this paperwork. If you have not heard from us regarding the results in 2 weeks, please contact this office.       Hypertension Hypertension, commonly called high blood pressure, is when the force of blood pumping through the arteries is too strong. The arteries are the blood vessels that carry blood from the heart throughout the body. Hypertension forces the heart to work harder to pump blood and may cause arteries to become narrow or stiff. Having untreated or uncontrolled hypertension can cause heart attacks, strokes, kidney disease, and other problems. A blood pressure reading consists of a higher number over a lower number. Ideally, your blood pressure should be below 120/80. The first ("top") number is called the systolic pressure. It is a measure of the pressure in your arteries as your heart beats. The second ("bottom") number is called the diastolic pressure. It is a measure of the pressure in your arteries as the heart relaxes. What are the causes? The cause of this condition is not known. What increases the  risk? Some risk factors for high blood pressure are under your control. Others are not. Factors you can change  Smoking.  Having type 2 diabetes mellitus, high cholesterol, or both.  Not getting enough exercise or physical activity.  Being overweight.  Having too much fat, sugar, calories, or salt (sodium) in your diet.  Drinking too much alcohol. Factors that are difficult or impossible to change  Having chronic kidney disease.  Having a family history of high blood pressure.  Age. Risk increases with age.  Race. You may be at higher risk if you are African-American.  Gender. Men are at higher risk than women before age 45. After age 65, women are at higher risk than men.  Having obstructive sleep apnea.  Stress. What are the signs or symptoms? Extremely high blood pressure (hypertensive crisis) may cause:  Headache.  Anxiety.  Shortness of breath.  Nosebleed.  Nausea and vomiting.  Severe chest pain.  Jerky movements you cannot control (seizures). How is this diagnosed? This condition is diagnosed by measuring your blood pressure while you are seated, with your arm resting on a surface. The cuff of the blood pressure monitor will be placed directly against the skin of your upper arm at the level of your heart. It should be measured at least twice using the same arm. Certain conditions can cause a difference in blood pressure between your right and left arms. Certain factors can cause blood pressure readings to be lower or higher than normal (elevated) for a short period of time:    When your blood pressure is higher when you are in a health care provider's office than when you are at home, this is called white coat hypertension. Most people with this condition do not need medicines.  When your blood pressure is higher at home than when you are in a health care provider's office, this is called masked hypertension. Most people with this condition may need medicines  to control blood pressure. If you have a high blood pressure reading during one visit or you have normal blood pressure with other risk factors:  You may be asked to return on a different day to have your blood pressure checked again.  You may be asked to monitor your blood pressure at home for 1 week or longer. If you are diagnosed with hypertension, you may have other blood or imaging tests to help your health care provider understand your overall risk for other conditions. How is this treated? This condition is treated by making healthy lifestyle changes, such as eating healthy foods, exercising more, and reducing your alcohol intake. Your health care provider may prescribe medicine if lifestyle changes are not enough to get your blood pressure under control, and if:  Your systolic blood pressure is above 130.  Your diastolic blood pressure is above 80. Your personal target blood pressure may vary depending on your medical conditions, your age, and other factors. Follow these instructions at home: Eating and drinking   Eat a diet that is high in fiber and potassium, and low in sodium, added sugar, and fat. An example eating plan is called the DASH (Dietary Approaches to Stop Hypertension) diet. To eat this way: ? Eat plenty of fresh fruits and vegetables. Try to fill half of your plate at each meal with fruits and vegetables. ? Eat whole grains, such as whole wheat pasta, brown rice, or whole grain bread. Fill about one quarter of your plate with whole grains. ? Eat or drink low-fat dairy products, such as skim milk or low-fat yogurt. ? Avoid fatty cuts of meat, processed or cured meats, and poultry with skin. Fill about one quarter of your plate with lean proteins, such as fish, chicken without skin, beans, eggs, and tofu. ? Avoid premade and processed foods. These tend to be higher in sodium, added sugar, and fat.  Reduce your daily sodium intake. Most people with hypertension should  eat less than 1,500 mg of sodium a day.  Limit alcohol intake to no more than 1 drink a day for nonpregnant women and 2 drinks a day for men. One drink equals 12 oz of beer, 5 oz of wine, or 1 oz of hard liquor. Lifestyle   Work with your health care provider to maintain a healthy body weight or to lose weight. Ask what an ideal weight is for you.  Get at least 30 minutes of exercise that causes your heart to beat faster (aerobic exercise) most days of the week. Activities may include walking, swimming, or biking.  Include exercise to strengthen your muscles (resistance exercise), such as pilates or lifting weights, as part of your weekly exercise routine. Try to do these types of exercises for 30 minutes at least 3 days a week.  Do not use any products that contain nicotine or tobacco, such as cigarettes and e-cigarettes. If you need help quitting, ask your health care provider.  Monitor your blood pressure at home as told by your health care provider.  Keep all follow-up visits as told by your health care provider.   This is important. Medicines  Take over-the-counter and prescription medicines only as told by your health care provider. Follow directions carefully. Blood pressure medicines must be taken as prescribed.  Do not skip doses of blood pressure medicine. Doing this puts you at risk for problems and can make the medicine less effective.  Ask your health care provider about side effects or reactions to medicines that you should watch for. Contact a health care provider if:  You think you are having a reaction to a medicine you are taking.  You have headaches that keep coming back (recurring).  You feel dizzy.  You have swelling in your ankles.  You have trouble with your vision. Get help right away if:  You develop a severe headache or confusion.  You have unusual weakness or numbness.  You feel faint.  You have severe pain in your chest or abdomen.  You vomit  repeatedly.  You have trouble breathing. Summary  Hypertension is when the force of blood pumping through your arteries is too strong. If this condition is not controlled, it may put you at risk for serious complications.  Your personal target blood pressure may vary depending on your medical conditions, your age, and other factors. For most people, a normal blood pressure is less than 120/80.  Hypertension is treated with lifestyle changes, medicines, or a combination of both. Lifestyle changes include weight loss, eating a healthy, low-sodium diet, exercising more, and limiting alcohol. This information is not intended to replace advice given to you by your health care provider. Make sure you discuss any questions you have with your health care provider. Document Released: 11/27/2005 Document Revised: 10/25/2016 Document Reviewed: 10/25/2016 Elsevier Interactive Patient Education  2019 Elsevier Inc.  

## 2019-06-05 ENCOUNTER — Other Ambulatory Visit: Payer: Self-pay | Admitting: Emergency Medicine

## 2019-06-05 ENCOUNTER — Encounter: Payer: Self-pay | Admitting: Emergency Medicine

## 2019-06-05 DIAGNOSIS — R944 Abnormal results of kidney function studies: Secondary | ICD-10-CM

## 2019-06-05 LAB — COMPREHENSIVE METABOLIC PANEL
ALT: 12 IU/L (ref 0–32)
AST: 15 IU/L (ref 0–40)
Albumin/Globulin Ratio: 1.2 (ref 1.2–2.2)
Albumin: 4 g/dL (ref 3.8–4.8)
Alkaline Phosphatase: 92 IU/L (ref 39–117)
BUN/Creatinine Ratio: 18 (ref 12–28)
BUN: 21 mg/dL (ref 8–27)
Bilirubin Total: 0.2 mg/dL (ref 0.0–1.2)
CO2: 25 mmol/L (ref 20–29)
Calcium: 9.8 mg/dL (ref 8.7–10.3)
Chloride: 102 mmol/L (ref 96–106)
Creatinine, Ser: 1.2 mg/dL — ABNORMAL HIGH (ref 0.57–1.00)
GFR calc Af Amer: 54 mL/min/{1.73_m2} — ABNORMAL LOW (ref >59)
GFR calc non Af Amer: 47 mL/min/{1.73_m2} — ABNORMAL LOW (ref >59)
Globulin, Total: 3.4 g/dL (ref 1.5–4.5)
Glucose: 90 mg/dL (ref 65–99)
Potassium: 4.1 mmol/L (ref 3.5–5.2)
Sodium: 141 mmol/L (ref 134–144)
Total Protein: 7.4 g/dL (ref 6.0–8.5)

## 2019-06-05 LAB — CBC WITH DIFFERENTIAL/PLATELET
Basophils Absolute: 0.1 10*3/uL (ref 0.0–0.2)
Basos: 1 %
EOS (ABSOLUTE): 0.2 10*3/uL (ref 0.0–0.4)
Eos: 3 %
Hematocrit: 34.5 % (ref 34.0–46.6)
Hemoglobin: 10.2 g/dL — ABNORMAL LOW (ref 11.1–15.9)
Immature Grans (Abs): 0 10*3/uL (ref 0.0–0.1)
Immature Granulocytes: 0 %
Lymphocytes Absolute: 2.3 10*3/uL (ref 0.7–3.1)
Lymphs: 31 %
MCH: 21.6 pg — ABNORMAL LOW (ref 26.6–33.0)
MCHC: 29.6 g/dL — ABNORMAL LOW (ref 31.5–35.7)
MCV: 73 fL — ABNORMAL LOW (ref 79–97)
Monocytes Absolute: 0.3 10*3/uL (ref 0.1–0.9)
Monocytes: 4 %
Neutrophils Absolute: 4.7 10*3/uL (ref 1.4–7.0)
Neutrophils: 61 %
Platelets: 303 10*3/uL (ref 150–450)
RBC: 4.73 x10E6/uL (ref 3.77–5.28)
RDW: 15.7 % — ABNORMAL HIGH (ref 11.7–15.4)
WBC: 7.7 10*3/uL (ref 3.4–10.8)

## 2019-06-05 LAB — LIPID PANEL
Chol/HDL Ratio: 3 ratio (ref 0.0–4.4)
Cholesterol, Total: 136 mg/dL (ref 100–199)
HDL: 45 mg/dL (ref >39)
LDL Calculated: 73 mg/dL (ref 0–99)
Triglycerides: 91 mg/dL (ref 0–149)
VLDL Cholesterol Cal: 18 mg/dL (ref 5–40)

## 2019-06-05 LAB — HEMOGLOBIN A1C
Est. average glucose Bld gHb Est-mCnc: 131 mg/dL
Hgb A1c MFr Bld: 6.2 % — ABNORMAL HIGH (ref 4.8–5.6)

## 2019-06-05 LAB — TSH: TSH: 1.22 u[IU]/mL (ref 0.450–4.500)

## 2019-09-18 DIAGNOSIS — H401132 Primary open-angle glaucoma, bilateral, moderate stage: Secondary | ICD-10-CM | POA: Diagnosis not present

## 2019-09-22 ENCOUNTER — Ambulatory Visit (INDEPENDENT_AMBULATORY_CARE_PROVIDER_SITE_OTHER): Payer: Medicare HMO | Admitting: Emergency Medicine

## 2019-09-22 ENCOUNTER — Encounter: Payer: Self-pay | Admitting: Emergency Medicine

## 2019-09-22 ENCOUNTER — Other Ambulatory Visit: Payer: Self-pay

## 2019-09-22 ENCOUNTER — Ambulatory Visit (INDEPENDENT_AMBULATORY_CARE_PROVIDER_SITE_OTHER): Payer: Medicare HMO

## 2019-09-22 VITALS — BP 135/83 | HR 90 | Temp 99.2°F | Resp 16 | Ht 62.5 in | Wt 203.6 lb

## 2019-09-22 DIAGNOSIS — K219 Gastro-esophageal reflux disease without esophagitis: Secondary | ICD-10-CM | POA: Diagnosis not present

## 2019-09-22 DIAGNOSIS — M79672 Pain in left foot: Secondary | ICD-10-CM

## 2019-09-22 DIAGNOSIS — Z23 Encounter for immunization: Secondary | ICD-10-CM

## 2019-09-22 MED ORDER — FAMOTIDINE 40 MG PO TABS: 40.0000 mg | ORAL_TABLET | Freq: Two times a day (BID) | ORAL | 2 refills | Status: DC | PRN

## 2019-09-22 MED ORDER — OMEPRAZOLE 40 MG PO CPDR: 40.0000 mg | DELAYED_RELEASE_CAPSULE | Freq: Every day | ORAL | 2 refills | Status: DC

## 2019-09-22 MED ORDER — MELOXICAM 7.5 MG PO TABS: 7.5000 mg | ORAL_TABLET | Freq: Every day | ORAL | 0 refills | Status: AC

## 2019-09-22 NOTE — Patient Instructions (Addendum)
     If you have lab work done today you will be contacted with your lab results within the next 2 weeks.  If you have not heard from us then please contact us. The fastest way to get your results is to register for My Chart.   IF you received an x-ray today, you will receive an invoice from [facility]. Please contact [facility] at [phone] with questions or concerns regarding your invoice.   IF you received labwork today, you will receive an invoice from [facility]. Please contact [facility] at [phone] with questions or concerns regarding your invoice.   Our billing staff will not be able to assist you with questions regarding bills from these companies.  You will be contacted with the lab results as soon as they are available. The fastest way to get your results is to activate your My Chart account. Instructions are located on the last page of this paperwork. If you have not heard from us regarding the results in 2 weeks, please contact this office.      Foot Pain Many things can cause foot pain. Some common causes are:  An injury.  A sprain.  Arthritis.  Blisters.  Bunions. Follow these instructions at home: Managing pain, stiffness, and swelling If directed, put ice on the painful area:  Put ice in a plastic bag.  Place a towel between your skin and the bag.  Leave the ice on for 20 minutes, 2-3 times a day.  Activity  Do not stand or walk for long periods.  Return to your normal activities as told by your health care provider. Ask your health care provider what activities are safe for you.  Do stretches to relieve foot pain and stiffness as told by your health care provider.  Do not lift anything that is heavier than 10 lb (4.5 kg), or the limit that you are told, until your health care provider says that it is safe. Lifting a lot of weight can put added pressure on your feet. Lifestyle  Wear comfortable, supportive shoes that fit you  well. Do not wear high heels.  Keep your feet clean and dry. General instructions  Take over-the-counter and prescription medicines only as told by your health care provider.  Rub your foot gently.  Pay attention to any changes in your symptoms.  Keep all follow-up visits as told by your health care provider. This is important. Contact a health care provider if:  Your pain does not get better after a few days of self-care.  Your pain gets worse.  You cannot stand on your foot. Get help right away if:  Your foot is numb or tingling.  Your foot or toes are swollen.  Your foot or toes turn white or blue.  You have warmth and redness along your foot. Summary  Common causes of foot pain are injury, sprain, arthritis, blisters or bunions.  Ice, medicines, and comfortable shoes may help foot pain.  Contact your health care provider if your pain does not get better after a few days of self-care. This information is not intended to replace advice given to you by your health care provider. Make sure you discuss any questions you have with your health care provider. Document Released: 12/24/2015 Document Revised: 09/12/2018 Document Reviewed: 09/12/2018 Elsevier Patient Education  2020 Elsevier Inc.  

## 2019-09-22 NOTE — Progress Notes (Signed)
Eileen Jefferson 68 y.o.   Chief Complaint  Patient presents with  . Foot Pain    LEFT with swelling - per patient started this monrning  . Medication Refill    pend    HISTORY OF PRESENT ILLNESS: This is a 68 y.o. female complaining of left foot pain that started this morning.  Denies injuries.  Denies history of gout. Also needs medication refills for GERD. No other complaints or medical concerns today.  HPI   Prior to Admission medications   Medication Sig Start Date End Date Taking? Authorizing Provider  aspirin EC 81 MG tablet Take 81 mg by mouth daily.   Yes [provider]  Blood Glucose Monitoring Suppl (ACCU-CHEK AVIVA PLUS) w/Device KIT Test blood sugar once daily. Dx: E11.9 10/25/16  Yes Shawnee Knapp, MD  brimonidine-timolol (COMBIGAN) 0.2-0.5 % ophthalmic solution Place 1 drop into the left eye 2 (two) times daily.     Yes [provider]  brinzolamide (AZOPT) 1 % ophthalmic suspension Place 1 drop into the left eye 2 (two) times daily.     Yes [provider]  famotidine (PEPCID) 40 MG tablet Take 1 tablet (40 mg total) by mouth 2 (two) times daily as needed for heartburn or indigestion. 09/22/19  Yes Anneth Brunell, Ines Bloomer, MD  glucose blood (ACCU-CHEK AVIVA PLUS) test strip Test blood sugar once daily. Dx: E11.9 10/25/16  Yes Shawnee Knapp, MD  lisinopril-hydrochlorothiazide (PRINZIDE,ZESTORETIC) 10-12.5 MG tablet Take 1 tablet by mouth daily. 11/23/18  Yes Shawnee Knapp, MD  metFORMIN (GLUCOPHAGE) 500 MG tablet Take 1 tablet (500 mg total) by mouth 2 (two) times daily with a meal. 11/23/18  Yes Shawnee Knapp, MD  omeprazole (PRILOSEC) 40 MG capsule Take 1 capsule (40 mg total) by mouth daily. 30-60 minutes before breakfast 09/22/19  Yes Janyiah Silveri, Ines Bloomer, MD  rosuvastatin (CRESTOR) 40 MG tablet Take 1 tablet (40 mg total) by mouth daily. 11/23/18  Yes Shawnee Knapp, MD  ACCU-CHEK SOFTCLIX LANCETS lancets Test blood sugar once daily. Dx: E11.9  10/25/16   Shawnee Knapp, MD  Alcohol Swabs PADS Test blood sugar once daily. Dx: E11.9 10/25/16   Shawnee Knapp, MD  Blood Glucose Calibration (ACCU-CHEK AVIVA) SOLN Test blood sugar once daily. Dx: E11.9 10/25/16   Shawnee Knapp, MD  Continuous Blood Gluc Receiver (FREESTYLE LIBRE READER) DEVI 1 application by Does not apply route as needed. 06/04/19   Horald Pollen, MD  Continuous Blood Gluc Sensor ([place]) MISC 1 application by Does not apply route as needed. 06/04/19   Horald Pollen, MD  cyclobenzaprine (FLEXERIL) 10 MG tablet Take 1 tablet (10 mg total) by mouth at bedtime. Patient not taking: Reported on 09/22/2019 12/23/18   Horald Pollen, MD    Allergies  Allergen Reactions  . Contrast Media [Iodinated Diagnostic Agents]   . Iohexol Other (See Comments)    " made me feel like I was burning inside"    Patient Active Problem List   Diagnosis Date Noted  . Gastroesophageal reflux disease 11/23/2018  . DDD (degenerative disc disease), cervical 07/11/2017  . Localized osteoarthrosis of right shoulder region 07/11/2017  . Anemia 07/11/2017  . Hyperlipidemia 06/28/2016  . Thyroid disease 06/28/2016  . Diabetic retinopathy ([place]) 05/05/2015  . Obesity 01/11/2015  . Glaucoma 04/06/2014  . Macular degeneration 04/06/2014  . 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 HTN (hypertension) 10/02/2013  . Sarcoidosis 10/02/2013  .  Diabetes mellitus, type 2 ([place]) 10/02/2013  . OSA (obstructive sleep apnea) 10/23/2012    Past Medical History:  Diagnosis Date  . DDD (degenerative disc disease), cervical 07/11/2017   C5-6 and C6-7 cervical spondylosis and degenerative disc disease on 2005 Xray  . Diverticulosis   . Glaucoma 04/06/2014  . HTN (hypertension)   . Hyperlipidemia   . 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 Iron deficiency anemia   . Pulmonary sarcoidosis ([place])   . Sleep apnea   . Thyroid disease   . Type  2 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)     Past Surgical History:  Procedure Laterality Date  . APPENDECTOMY    . c-section     x 2  . CATARACT EXTRACTION     bilateral  . SCLERAL BUCKLE     right  . TONSILLECTOMY    . TOTAL ABDOMINAL HYSTERECTOMY    . TUBAL LIGATION    . VITRECTOMY     bilateral    Social History   Socioeconomic History  . Marital status: Married    Spouse name: Not on file  . Number of children: 2  . Years of education: Not on file  . Highest education level: Not on file  Occupational History  . Occupation: behavioral health  Social Needs  . Financial resource strain: Not on file  . Food insecurity    Worry: Not on file    Inability: Not on file  . Transportation needs    Medical: Not on file    Non-medical: Not on file  Tobacco Use  . Smoking status: Former Smoker    Packs/day: 0.20    Years: 1.00    Pack years: 0.20    Types: Cigarettes    Quit date: 12/11/1968    Years since quitting: 50.8  . Smokeless tobacco: Never Used  Substance and Sexual Activity  . Alcohol use: No  . Drug use: No  . Sexual activity: Not on file  Lifestyle  . Physical activity    Days per week: Not on file    Minutes per session: Not on file  . Stress: Not on file  Relationships  . Social Herbalist on phone: Not on file    Gets together: Not on file    Attends religious service: Not on file    Active member of club or organization: Not on file    Attends meetings of clubs or organizations: Not on file    Relationship status: Not on file  . Intimate partner violence    Fear of current or ex partner: Not on file    Emotionally abused: Not on file    Physically abused: Not on file    Forced sexual activity: Not on file  Other Topics Concern  . Not on file  Social History Narrative  . Not on file    Family History  Problem Relation Age of Onset  . Colon polyps Mother        brothers x2  . Clotting  disorder Mother        brother x 2, MGM  . Heart disease Brother        mother, MGM  . Diabetes Brother        x 2, MGM  . Stroke Maternal Grandmother   . Colon cancer Neg Hx   . Breast cancer Neg Hx      Review of Systems  Constitutional: Negative.  Negative for chills and fever.  HENT: Negative.  Negative for congestion and sore throat.   Respiratory: Negative.  Negative for cough and shortness of breath.   Cardiovascular: Negative.  Negative for chest pain and palpitations.  Gastrointestinal: Negative.  Negative for abdominal pain, diarrhea, nausea and vomiting.  Musculoskeletal:       Left foot pain  Skin: Negative.  Negative for rash.  Neurological: Negative for dizziness and headaches.  All other systems reviewed and are negative.  Today's Vitals   09/22/19 1535  BP: 135/83  Pulse: 90  Resp: 16  Temp: 99.2 F (37.3 C)  TempSrc: Oral  SpO2: 99%  Weight: 203 lb 9.6 oz (92.4 kg)  Height: 5' 2.5" (1.588 m)   Body mass index is 36.65 kg/m.   Physical Exam Vitals signs reviewed.  Constitutional:      Appearance: Normal appearance.  HENT:     Head: Normocephalic.  Eyes:     Extraocular Movements: Extraocular movements intact.     Pupils: Pupils are equal, round, and reactive to light.  Neck:     Musculoskeletal: Normal range of motion.  Cardiovascular:     Rate and Rhythm: Normal rate and regular rhythm.     Pulses: Normal pulses.     Heart sounds: Normal heart sounds.  Pulmonary:     Effort: Pulmonary effort is normal.     Breath sounds: Normal breath sounds.  Musculoskeletal:     Comments: Left foot: No erythema or bruising.  Warm to touch.  Neurovascularly intact.  Normal toes.  Small area of swelling with tenderness on mid medial aspect.  Skin:    General: Skin is warm and dry.     Capillary Refill: Capillary refill takes less than 2 seconds.  Neurological:     General: No focal deficit present.     Mental Status: She is alert and oriented to  person, place, and time.  Psychiatric:        Mood and Affect: Mood normal.        Behavior: Behavior normal.    Dg Foot Complete Left  Result Date: 09/22/2019 CLINICAL DATA:  Left foot pain. EXAM: LEFT FOOT - COMPLETE 3+ VIEW COMPARISON:  None. FINDINGS: There is no evidence of fracture or dislocation. There is no evidence of arthropathy or other focal bone abnormality. Soft tissues are unremarkable. IMPRESSION: Negative. Electronically Signed   By: Marijo Conception M.D.   On: 09/22/2019 16:21     ASSESSMENT & PLAN: Zell was seen today for foot pain and medication refill.  Diagnoses and all orders for this visit:  Left foot pain -     DG Foot Complete Left; Future -     meloxicam (MOBIC) 7.5 MG tablet; Take 1 tablet (7.5 mg total) by mouth daily for 7 days.  Need for prophylactic vaccination against Streptococcus pneumoniae (pneumococcus) and influenza -     Flu Vaccine QUAD High Dose(Fluad)  Gastroesophageal reflux disease, unspecified whether esophagitis present -     famotidine (PEPCID) 40 MG tablet; Take 1 tablet (40 mg total) by mouth 2 (two) times daily as needed for heartburn or indigestion. -     omeprazole (PRILOSEC) 40 MG capsule; Take 1 capsule (40 mg total) by mouth daily. 30-60 minutes before breakfast    Patient Instructions       If you have lab work done today you will be contacted with your lab results within the next 2 weeks.  If you have not heard from  [place] then please contact us. The fastest way to get your results is to register for My Chart.   IF you received an x-ray today, you will receive an invoice from [facility]. Please contact [facility] at [phone] with questions or concerns regarding your invoice.   IF you received labwork today, you will receive an invoice from [facility]. Please contact [facility] at [phone] with questions or concerns regarding your invoice.   Our billing staff will not be able to assist you with  questions regarding bills from these companies.  You will be contacted with the lab results as soon as they are available. The fastest way to get your results is to activate your My Chart account. Instructions are located on the last page of this paperwork. If you have not heard from [place] regarding the results in 2 weeks, please contact this office.     Foot Pain Many things can cause foot pain. Some common causes are:  An injury.  A sprain.  Arthritis.  Blisters.  Bunions. Follow these instructions at home: Managing pain, stiffness, and swelling If directed, put ice on the painful area:  Put ice in a plastic bag.  Place a towel between your skin and the bag.  Leave the ice on for 20 minutes, 2-3 times a day.  Activity  Do not stand or walk for long periods.  Return to your normal activities as told by your health care provider. Ask your health care provider what activities are safe for you.  Do stretches to relieve foot pain and stiffness as told by your health care provider.  Do not lift anything that is heavier than 10 lb (4.5 kg), or the limit that you are told, until your health care provider says that it is safe. Lifting a lot of weight can put added pressure on your feet. Lifestyle  Wear comfortable, supportive shoes that fit you well. Do not wear high heels.  Keep your feet clean and dry. General instructions  Take over-the-counter and prescription medicines only as told by your health care provider.  Rub your foot gently.  Pay attention to any changes in your symptoms.  Keep all follow-up visits as told by your health care provider. This is important. Contact a health care provider if:  Your pain does not get better after a few days of self-care.  Your pain gets worse.  You cannot stand on your foot. Get help right away if:  Your foot is numb or tingling.  Your foot or toes are swollen.  Your foot or toes turn white or blue.  You have warmth  and redness along your foot. Summary  Common causes of foot pain are injury, sprain, arthritis, blisters or bunions.  Ice, medicines, and comfortable shoes may help foot pain.  Contact your health care provider if your pain does not get better after a few days of self-care. This information is not intended to replace advice given to you by your health care provider. Make sure you discuss any questions you have with your health care provider. Document Released: 12/24/2015 Document Revised: 09/12/2018 Document Reviewed: 09/12/2018 Elsevier Patient Education  2020 Elsevier Inc.      Agustina Caroli, MD [facility]

## 2019-10-15 DIAGNOSIS — H35073 Retinal telangiectasis, bilateral: Secondary | ICD-10-CM | POA: Diagnosis not present

## 2019-10-15 DIAGNOSIS — H35353 Cystoid macular degeneration, bilateral: Secondary | ICD-10-CM | POA: Diagnosis not present

## 2019-10-15 DIAGNOSIS — D8689 Sarcoidosis of other sites: Secondary | ICD-10-CM | POA: Diagnosis not present

## 2019-10-15 DIAGNOSIS — H4720 Unspecified optic atrophy: Secondary | ICD-10-CM | POA: Diagnosis not present

## 2019-10-16 ENCOUNTER — Other Ambulatory Visit: Payer: Self-pay | Admitting: Emergency Medicine

## 2019-10-16 DIAGNOSIS — Z1231 Encounter for screening mammogram for malignant neoplasm of breast: Secondary | ICD-10-CM

## 2019-10-23 ENCOUNTER — Other Ambulatory Visit: Payer: Self-pay

## 2019-10-23 ENCOUNTER — Ambulatory Visit: Payer: Medicare HMO | Admitting: Internal Medicine

## 2019-10-23 ENCOUNTER — Encounter: Payer: Self-pay | Admitting: Internal Medicine

## 2019-10-23 VITALS — BP 140/88 | HR 74 | Temp 97.0°F | Ht 62.0 in | Wt 211.0 lb

## 2019-10-23 DIAGNOSIS — D869 Sarcoidosis, unspecified: Secondary | ICD-10-CM | POA: Diagnosis not present

## 2019-10-23 DIAGNOSIS — G4733 Obstructive sleep apnea (adult) (pediatric): Secondary | ICD-10-CM

## 2019-10-23 DIAGNOSIS — E11319 Type 2 diabetes mellitus with unspecified diabetic retinopathy without macular edema: Secondary | ICD-10-CM | POA: Diagnosis not present

## 2019-10-23 NOTE — Patient Instructions (Signed)
Order- schedule sleep study- HST or Split night    Dx OSA  Please call us about 2 weeks after your sleep study is done, to see if results and recommendations are ready yet.

## 2019-10-23 NOTE — Assessment & Plan Note (Signed)
Clinically in long-term remission 

## 2019-10-23 NOTE — Progress Notes (Signed)
10/23/2019- 68 yoF former smoker for OSA. Had dropped off and coming now to re-establish. Medical problem list includes HBP, GERD, DM2 with retinopathy, Macular degeneration, Cervical DDD, Sarcoid/ Iridocyclitis, Glaucoma, Obesity,  PSG 10/11/04>>AHI 16.7, SpO2 low 85% PSG 10/31/12>>AHI 12.9, REM 44.8, SpO2 low 59%, PLMI 0. Auto CPAP 12/11/12 to 02/04/13 >>  NPSG 10/31/12- AHI 12.9/ hr, desat to 59%, body weight 230 lbs Last saw Dr Sood in 2015. Had dropped off CPAP years ago and machine is old. Her Ophthalmologist, Dr Rankin, told her it is very important for her eyesight that she get back on CPAP. Husband tells her she snores "all the time". ENT surgery + tonsils Epworth Score 3 Body weight today  211 lbs No routine cough, wheeze or unusual dyspnea.  Had flu vax Discussed Covid precautions and vaccine.   Prior to Admission medications   Medication Sig Start Date End Date Taking? Authorizing Provider  ACCU-CHEK SOFTCLIX LANCETS lancets Test blood sugar once daily. Dx: E11.9 10/25/16  Yes Shaw, Eva N, MD  Alcohol Swabs PADS Test blood sugar once daily. Dx: E11.9 10/25/16  Yes Shaw, Eva N, MD  aspirin EC 81 MG tablet Take 81 mg by mouth daily.   Yes [provider]  Blood Glucose Calibration (ACCU-CHEK AVIVA) SOLN Test blood sugar once daily. Dx: E11.9 10/25/16  Yes Shaw, Eva N, MD  Blood Glucose Monitoring Suppl (ACCU-CHEK AVIVA PLUS) w/Device KIT Test blood sugar once daily. Dx: E11.9 10/25/16  Yes Shaw, Eva N, MD  brimonidine-timolol (COMBIGAN) 0.2-0.5 % ophthalmic solution Place 1 drop into the left eye 2 (two) times daily.     Yes [provider]  brinzolamide (AZOPT) 1 % ophthalmic suspension Place 1 drop into the left eye 2 (two) times daily.     Yes [provider]  Continuous Blood Gluc Receiver (FREESTYLE LIBRE READER) DEVI 1 application by Does not apply route as needed. 06/04/19  Yes Sagardia, Miguel Jose, MD  Continuous Blood Gluc Sensor (FREESTYLE LIBRE  SENSOR SYSTEM) MISC 1 application by Does not apply route as needed. 06/04/19  Yes Sagardia, Miguel Jose, MD  famotidine (PEPCID) 40 MG tablet Take 1 tablet (40 mg total) by mouth 2 (two) times daily as needed for heartburn or indigestion. 09/22/19  Yes Sagardia, Miguel Jose, MD  glucose blood (ACCU-CHEK AVIVA PLUS) test strip Test blood sugar once daily. Dx: E11.9 10/25/16  Yes Shaw, Eva N, MD  lisinopril-hydrochlorothiazide (PRINZIDE,ZESTORETIC) 10-12.5 MG tablet Take 1 tablet by mouth daily. 11/23/18  Yes Shaw, Eva N, MD  metFORMIN (GLUCOPHAGE) 500 MG tablet Take 1 tablet (500 mg total) by mouth 2 (two) times daily with a meal. 11/23/18  Yes Shaw, Eva N, MD  omeprazole (PRILOSEC) 40 MG capsule Take 1 capsule (40 mg total) by mouth daily. 30-60 minutes before breakfast 09/22/19  Yes Sagardia, Miguel Jose, MD  rosuvastatin (CRESTOR) 40 MG tablet Take 1 tablet (40 mg total) by mouth daily. 11/23/18  Yes Shaw, Eva N, MD  cyclobenzaprine (FLEXERIL) 10 MG tablet Take 1 tablet (10 mg total) by mouth at bedtime. Patient not taking: Reported on 09/22/2019 12/23/18   Sagardia, Miguel Jose, MD   Past Medical History:  Diagnosis Date  . DDD (degenerative disc disease), cervical 07/11/2017   C5-6 and C6-7 cervical spondylosis and degenerative disc disease on 2005 Xray  . Diverticulosis   . Glaucoma 04/06/2014  . HTN (hypertension)   . Hyperlipidemia   . 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 Iron deficiency anemia   .   Pulmonary sarcoidosis ([place])   . Sleep apnea   . Thyroid disease   . 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 APPENDECTOMY    . c-section     x 2  . CATARACT EXTRACTION     bilateral  . SCLERAL BUCKLE     right  . TONSILLECTOMY    . TOTAL ABDOMINAL HYSTERECTOMY    . TUBAL LIGATION    . VITRECTOMY     bilateral   Family History  Problem Relation Age of Onset  .  Colon polyps Mother        brothers x2  . Clotting disorder Mother        brother x 2, MGM  . Heart disease Brother        mother, MGM  . Diabetes Brother        x 2, MGM  . Stroke Maternal Grandmother   . Colon cancer Neg Hx   . Breast cancer Neg Hx    Social History   Socioeconomic History  . Marital status: Married    Spouse name: Not on file  . Number of children: 2  . Years of education: Not on file  . Highest education level: Not on file  Occupational History  . Occupation: behavioral health  Social Needs  . Financial resource strain: Not on file  . Food insecurity    Worry: Not on file    Inability: Not on file  . Transportation needs    Medical: Not on file    Non-medical: Not on file  Tobacco Use  . Smoking status: Former Smoker    Packs/day: 0.20    Years: 1.00    Pack years: 0.20    Types: Cigarettes    Quit date: 12/11/1968    Years since quitting: 50.8  . Smokeless tobacco: Never Used  Substance and Sexual Activity  . Alcohol use: No  . Drug use: No  . Sexual activity: Not on file  Lifestyle  . Physical activity    Days per week: Not on file    Minutes per session: Not on file  . Stress: Not on file  Relationships  . Social Herbalist on phone: Not on file    Gets together: Not on file    Attends religious service: Not on file    Active member of club or organization: Not on file    Attends meetings of clubs or organizations: Not on file    Relationship status: Not on file  . Intimate partner violence    Fear of current or ex partner: Not on file    Emotionally abused: Not on file    Physically abused: Not on file    Forced sexual activity: Not on file  Other Topics Concern  . Not on file  Social History Narrative  . Not on file   ROS-see HPI   + = positive Constitutional:    weight loss, night sweats, fevers, chills, fatigue, lassitude. HEENT:    headaches, difficulty swallowing, tooth/dental problems, sore throat,        sneezing, itching, ear ache, nasal congestion, post nasal drip, snoring CV:    chest pain, orthopnea, PND, swelling in lower extremities, anasarca,  dizziness, palpitations Resp:   shortness of breath with exertion or at rest.                productive cough,   non-productive cough, coughing up of blood.              change in color of mucus.  wheezing.   Skin:    rash or lesions. GI:    +heartburn, indigestion, abdominal pain, nausea, vomiting, diarrhea,                 change in bowel habits, loss of appetite GU: dysuria, change in color of urine, no urgency or frequency.   flank pain. MS:   joint pain, stiffness, decreased range of motion, back pain. Neuro-     nothing unusual Psych:  change in mood or affect.  depression or anxiety.   memory loss.  OBJ- Physical Exam General- Alert, Oriented, Affect-appropriate, Distress- none acute, + obese Skin- rash-none, lesions- none, excoriation- none Lymphadenopathy- none Head- atraumatic            Eyes- Gross vision intact, PERRLA, conjunctivae and secretions clear            Ears- Hearing, canals-normal            Nose- Clear, no-Septal dev, mucus, polyps, erosion, perforation             Throat- Mallampati IV , mucosa clear , drainage- none, tonsils- atrophic,  + teeth Neck- flexible , trachea midline, no stridor , thyroid nl, carotid no bruit Chest - symmetrical excursion , unlabored           Heart/CV- RRR , no murmur , no gallop  , no rub, nl s1 s2                           - JVD- none , edema- none, stasis changes- none, varices- none           Lung- clear to P&A, wheeze- none, cough- none , dullness-none, rub- none           Chest wall-  Abd-  Br/ Gen/ Rectal- Not done, not indicated Extrem- cyanosis- none, clubbing, none, atrophy- none, strength- nl Neuro- grossly intact to observation     

## 2019-10-23 NOTE — Assessment & Plan Note (Signed)
She needs new sleep study to update for qualification. CPAP or possibly an oral appliance likely appropriate, based on results. Discussed options.

## 2019-10-28 ENCOUNTER — Ambulatory Visit (INDEPENDENT_AMBULATORY_CARE_PROVIDER_SITE_OTHER): Payer: Medicare HMO | Admitting: Emergency Medicine

## 2019-10-28 ENCOUNTER — Other Ambulatory Visit: Payer: Self-pay

## 2019-10-28 ENCOUNTER — Encounter: Payer: Self-pay | Admitting: Emergency Medicine

## 2019-10-28 VITALS — BP 169/85 | HR 75 | Temp 98.1°F | Resp 16 | Ht 63.0 in | Wt 207.8 lb

## 2019-10-28 DIAGNOSIS — E1169 Type 2 diabetes mellitus with other specified complication: Secondary | ICD-10-CM | POA: Diagnosis not present

## 2019-10-28 DIAGNOSIS — E785 Hyperlipidemia, unspecified: Secondary | ICD-10-CM | POA: Diagnosis not present

## 2019-10-28 DIAGNOSIS — E1159 Type 2 diabetes mellitus with other circulatory complications: Secondary | ICD-10-CM | POA: Diagnosis not present

## 2019-10-28 DIAGNOSIS — Z8669 Personal history of other diseases of the nervous system and sense organs: Secondary | ICD-10-CM | POA: Diagnosis not present

## 2019-10-28 DIAGNOSIS — I10 Essential (primary) hypertension: Secondary | ICD-10-CM

## 2019-10-28 DIAGNOSIS — Z0001 Encounter for general adult medical examination with abnormal findings: Secondary | ICD-10-CM | POA: Diagnosis not present

## 2019-10-28 DIAGNOSIS — Z862 Personal history of diseases of the blood and blood-forming organs and certain disorders involving the immune mechanism: Secondary | ICD-10-CM

## 2019-10-28 DIAGNOSIS — Z8719 Personal history of other diseases of the digestive system: Secondary | ICD-10-CM | POA: Diagnosis not present

## 2019-10-28 DIAGNOSIS — Z0000 Encounter for general adult medical examination without abnormal findings: Secondary | ICD-10-CM

## 2019-10-28 MED ORDER — LISINOPRIL-HYDROCHLOROTHIAZIDE 10-12.5 MG PO TABS: 1.0000 | ORAL_TABLET | Freq: Every day | ORAL | 3 refills | Status: DC

## 2019-10-28 NOTE — Patient Instructions (Addendum)
   If you have lab work done today you will be contacted with your lab results within the next 2 weeks.  If you have not heard from us then please contact us. The fastest way to get your results is to register for My Chart.   IF you received an x-ray today, you will receive an invoice from [facility]. Please contact [facility] at [phone] with questions or concerns regarding your invoice.   IF you received labwork today, you will receive an invoice from [facility]. Please contact [facility] at [phone] with questions or concerns regarding your invoice.   Our billing staff will not be able to assist you with questions regarding bills from these companies.  You will be contacted with the lab results as soon as they are available. The fastest way to get your results is to activate your My Chart account. Instructions are located on the last page of this paperwork. If you have not heard from us regarding the results in 2 weeks, please contact this office.      Health Maintenance, Female Adopting a healthy lifestyle and getting preventive care are important in promoting health and wellness. Ask your health care provider about:  The right schedule for you to have regular tests and exams.  Things you can do on your own to prevent diseases and keep yourself healthy. What should I know about diet, weight, and exercise? Eat a healthy diet   Eat a diet that includes plenty of vegetables, fruits, low-fat dairy products, and lean protein.  Do not eat a lot of foods that are high in solid fats, added sugars, or sodium. Maintain a healthy weight Body mass index (BMI) is used to identify weight problems. It estimates body fat based on height and weight. Your health care provider can help determine your BMI and help you achieve or maintain a healthy weight. Get regular exercise Get regular exercise. This is one of the most important things you can do for your health. Most  adults should:  Exercise for at least 150 minutes each week. The exercise should increase your heart rate and make you sweat (moderate-intensity exercise).  Do strengthening exercises at least twice a week. This is in addition to the moderate-intensity exercise.  Spend less time sitting. Even light physical activity can be beneficial. Watch cholesterol and blood lipids Have your blood tested for lipids and cholesterol at 66 years of age, then have this test every 5 years. Have your cholesterol levels checked more often if:  Your lipid or cholesterol levels are high.  You are older than 66 years of age.  You are at high risk for heart disease. What should I know about cancer screening? Depending on your health history and family history, you may need to have cancer screening at various ages. This may include screening for:  Breast cancer.  Cervical cancer.  Colorectal cancer.  Skin cancer.  Lung cancer. What should I know about heart disease, diabetes, and high blood pressure? Blood pressure and heart disease  High blood pressure causes heart disease and increases the risk of stroke. This is more likely to develop in people who have high blood pressure readings, are of African descent, or are overweight.  Have your blood pressure checked: ? Every 3-5 years if you are 18-39 years of age. ? Every year if you are 40 years old or older. Diabetes Have regular diabetes screenings. This checks your fasting blood sugar level. Have the screening done:  Once every   three years after age 40 if you are at a normal weight and have a low risk for diabetes.  More often and at a younger age if you are overweight or have a high risk for diabetes. What should I know about preventing infection? Hepatitis B If you have a higher risk for hepatitis B, you should be screened for this virus. Talk with your health care provider to find out if you are at risk for hepatitis B infection. Hepatitis  C Testing is recommended for:  Everyone born from 1945 through 1965.  Anyone with known risk factors for hepatitis C. Sexually transmitted infections (STIs)  Get screened for STIs, including gonorrhea and chlamydia, if: ? You are sexually active and are younger than 66 years of age. ? You are older than 66 years of age and your health care provider tells you that you are at risk for this type of infection. ? Your sexual activity has changed since you were last screened, and you are at increased risk for chlamydia or gonorrhea. Ask your health care provider if you are at risk.  Ask your health care provider about whether you are at high risk for HIV. Your health care provider may recommend a prescription medicine to help prevent HIV infection. If you choose to take medicine to prevent HIV, you should first get tested for HIV. You should then be tested every 3 months for as long as you are taking the medicine. Pregnancy  If you are about to stop having your period (premenopausal) and you may become pregnant, seek counseling before you get pregnant.  Take 400 to 800 micrograms (mcg) of folic acid every day if you become pregnant.  Ask for birth control (contraception) if you want to prevent pregnancy. Osteoporosis and menopause Osteoporosis is a disease in which the bones lose minerals and strength with aging. This can result in bone fractures. If you are 65 years old or older, or if you are at risk for osteoporosis and fractures, ask your health care provider if you should:  Be screened for bone loss.  Take a calcium or vitamin D supplement to lower your risk of fractures.  Be given hormone replacement therapy (HRT) to treat symptoms of menopause. Follow these instructions at home: Lifestyle  Do not use any products that contain nicotine or tobacco, such as cigarettes, e-cigarettes, and chewing tobacco. If you need help quitting, ask your health care provider.  Do not use street  drugs.  Do not share needles.  Ask your health care provider for help if you need support or information about quitting drugs. Alcohol use  Do not drink alcohol if: ? Your health care provider tells you not to drink. ? You are pregnant, may be pregnant, or are planning to become pregnant.  If you drink alcohol: ? Limit how much you use to 0-1 drink a day. ? Limit intake if you are breastfeeding.  Be aware of how much alcohol is in your drink. In the [place] one drink equals one 12 oz bottle of beer (355 mL), one 5 oz glass of wine (148 mL), or one 1 oz glass of hard liquor (44 mL). General instructions  Schedule regular health, dental, and eye exams.  Stay current with your vaccines.  Tell your health care provider if: ? You often feel depressed. ? You have ever been abused or do not feel safe at home. Summary  Adopting a healthy lifestyle and getting preventive care are important in promoting health and wellness.    Follow your health care provider's instructions about healthy diet, exercising, and getting tested or screened for diseases.  Follow your health care provider's instructions on monitoring your cholesterol and blood pressure. This information is not intended to replace advice given to you by your health care provider. Make sure you discuss any questions you have with your health care provider. Document Released: 06/12/2011 Document Revised: 11/20/2018 Document Reviewed: 11/20/2018 [employer] Patient Education  2020 [employer]  

## 2019-10-28 NOTE — Progress Notes (Signed)
Eileen Jefferson 68 y.o.  Lab Results  Component Value Date   CHOL 136 06/04/2019   HDL 45 06/04/2019   LDLCALC 73 06/04/2019   TRIG 91 06/04/2019   CHOLHDL 3.0 06/04/2019    Chief Complaint  Patient presents with  . Annual Exam  . Medication Refill    lisino-hctz    HISTORY OF PRESENT ILLNESS: This is a 68 y.o. female here for her annual exam. Patient has several chronic medical problems: 1.  Diabetes type 2: On metformin 500 mg twice a day.  Normal glucose readings at home. 2.  Hypertension: On Zestoretic 10-12.5 mg daily.  Normal blood pressure readings at home. 3.  Dyslipidemia: On Crestor 40 mg daily. 4.  Sarcoidosis: Under control. 5.  Sleep apnea. 6.  GERD: On omeprazole 40 mg daily. Doing well.  Has no complaints or medical concerns today. Health maintenance reviewed with patient.  Scheduled for mammogram next month.  Up-to-date with colonoscopy. Status post hysterectomy in the past.  Non-smoker.  BP Readings from Last 3 Encounters:  10/28/19 (!) 169/85  10/23/19 140/88  09/22/19 135/83   Lab Results  Component Value Date   HGBA1C 6.2 (H) 06/04/2019    Wt Readings from Last 3 Encounters:  10/28/19 207 lb 12.8 oz (94.3 kg)  10/23/19 211 lb (95.7 kg)  09/22/19 203 lb 9.6 oz (92.4 kg)     HPI   Prior to Admission medications   Medication Sig Start Date End Date Taking? Authorizing Provider  ACCU-CHEK SOFTCLIX LANCETS lancets Test blood sugar once daily. Dx: E11.9 10/25/16  Yes Shawnee Knapp, MD  Alcohol Swabs PADS Test blood sugar once daily. Dx: E11.9 10/25/16  Yes Shawnee Knapp, MD  aspirin EC 81 MG tablet Take 81 mg by mouth daily.   Yes [provider]  Blood Glucose Calibration (ACCU-CHEK AVIVA) SOLN Test blood sugar once daily. Dx: E11.9 10/25/16  Yes Shawnee Knapp, MD  famotidine (PEPCID) 40 MG tablet Take 1 tablet (40 mg total) by mouth 2 (two) times daily as needed for heartburn or indigestion. 09/22/19  Yes Mackie Holness, Ines Bloomer, MD   lisinopril-hydrochlorothiazide (PRINZIDE,ZESTORETIC) 10-12.5 MG tablet Take 1 tablet by mouth daily. 11/23/18  Yes Shawnee Knapp, MD  metFORMIN (GLUCOPHAGE) 500 MG tablet Take 1 tablet (500 mg total) by mouth 2 (two) times daily with a meal. 11/23/18  Yes Shawnee Knapp, MD  omeprazole (PRILOSEC) 40 MG capsule Take 1 capsule (40 mg total) by mouth daily. 30-60 minutes before breakfast 09/22/19  Yes Shaundrea Carrigg, Ines Bloomer, MD  rosuvastatin (CRESTOR) 40 MG tablet Take 1 tablet (40 mg total) by mouth daily. 11/23/18  Yes Shawnee Knapp, MD  Blood Glucose Monitoring Suppl (ACCU-CHEK AVIVA PLUS) w/Device KIT Test blood sugar once daily. Dx: E11.9 10/25/16   Shawnee Knapp, MD  brimonidine-timolol (COMBIGAN) 0.2-0.5 % ophthalmic solution Place 1 drop into the left eye 2 (two) times daily.      [provider]  brinzolamide (AZOPT) 1 % ophthalmic suspension Place 1 drop into the left eye 2 (two) times daily.      [provider]  Continuous Blood Gluc Receiver (FREESTYLE LIBRE READER) DEVI 1 application by Does not apply route as needed. 06/04/19   Horald Pollen, MD  Continuous Blood Gluc Sensor ([place]) MISC 1 application by Does not apply route as needed. 06/04/19   Horald Pollen, MD  cyclobenzaprine (FLEXERIL) 10 MG tablet Take 1 tablet (10 mg total) by  mouth at bedtime. Patient not taking: Reported on 10/28/2019 12/23/18   Horald Pollen, MD  glucose blood (ACCU-CHEK AVIVA PLUS) test strip Test blood sugar once daily. Dx: E11.9 10/25/16   Shawnee Knapp, MD    Allergies  Allergen Reactions  . Contrast Media [Iodinated Diagnostic Agents]   . Iohexol Other (See Comments)    " made me feel like I was burning inside"    Patient Active Problem List   Diagnosis Date Noted  . Gastroesophageal reflux disease 11/23/2018  . DDD (degenerative disc disease), cervical 07/11/2017  . Localized osteoarthrosis of right shoulder region 07/11/2017  . Anemia  07/11/2017  . Hyperlipidemia 06/28/2016  . Thyroid disease 06/28/2016  . Diabetic retinopathy ([place]) 05/05/2015  . Obesity 01/11/2015  . Glaucoma 04/06/2014  . Macular degeneration 04/06/2014  . 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 HTN (hypertension) 10/02/2013  . Sarcoidosis 10/02/2013  . Diabetes mellitus, type 2 ([place]) 10/02/2013  . OSA (obstructive sleep apnea) 10/23/2012    Past Medical History:  Diagnosis Date  . DDD (degenerative disc disease), cervical 07/11/2017   C5-6 and C6-7 cervical spondylosis and degenerative disc disease on 2005 Xray  . Diverticulosis   . Glaucoma 04/06/2014  . HTN (hypertension)   . Hyperlipidemia   . 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 Iron deficiency anemia   . Pulmonary sarcoidosis ([place])   . Sleep apnea   . Thyroid disease   . 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)     Past Surgical History:  Procedure Laterality Date  . APPENDECTOMY    . c-section     x 2  . CATARACT EXTRACTION     bilateral  . SCLERAL BUCKLE     right  . TONSILLECTOMY    . TOTAL ABDOMINAL HYSTERECTOMY    . TUBAL LIGATION    . VITRECTOMY     bilateral    Social History   Socioeconomic History  . Marital status: Married    Spouse name: Not on file  . Number of children: 2  . Years of education: Not on file  . Highest education level: Not on file  Occupational History  . Occupation: behavioral health  Social Needs  . Financial resource strain: Not on file  . Food insecurity    Worry: Not on file    Inability: Not on file  . Transportation needs    Medical: Not on file    Non-medical: Not on file  Tobacco Use  . Smoking status: Former Smoker    Packs/day: 0.20    Years: 1.00    Pack years: 0.20    Types: Cigarettes    Quit date: 12/11/1968    Years since quitting: 50.9  . Smokeless tobacco: Never Used  Substance and Sexual Activity  . Alcohol use: No  . Drug use: No   . Sexual activity: Not on file  Lifestyle  . Physical activity    Days per week: Not on file    Minutes per session: Not on file  . Stress: Not on file  Relationships  . Social Herbalist on phone: Not on file    Gets together: Not on file    Attends religious service: Not on file    Active member of club or organization: Not on file    Attends meetings of clubs or organizations: Not on file    Relationship status: Not on file  .  Intimate partner violence    Fear of current or ex partner: Not on file    Emotionally abused: Not on file    Physically abused: Not on file    Forced sexual activity: Not on file  Other Topics Concern  . Not on file  Social History Narrative  . Not on file    Family History  Problem Relation Age of Onset  . Colon polyps Mother        brothers x2  . Clotting disorder Mother        brother x 2, MGM  . Heart disease Brother        mother, MGM  . Diabetes Brother        x 2, MGM  . Stroke Maternal Grandmother   . Colon cancer Neg Hx   . Breast cancer Neg Hx      Review of Systems  Constitutional: Negative.  Negative for chills, fever, malaise/fatigue and weight loss.  HENT: Negative.  Negative for congestion and sore throat.   Eyes: Negative.   Respiratory: Negative.  Negative for cough and shortness of breath.   Cardiovascular: Negative.  Negative for chest pain and palpitations.  Gastrointestinal: Negative.  Negative for abdominal pain, blood in stool, nausea and vomiting.  Genitourinary: Negative.  Negative for dysuria and hematuria.  Musculoskeletal: Negative.  Negative for back pain, myalgias and neck pain.  Skin: Negative.  Negative for rash.  Neurological: Negative.  Negative for dizziness and headaches.  All other systems reviewed and are negative.  Vitals:   10/28/19 0931  BP: (!) 169/85  Pulse: 75  Resp: 16  Temp: 98.1 F (36.7 C)  SpO2: 97%     Physical Exam Vitals signs reviewed.  Constitutional:       Appearance: Normal appearance.  HENT:     Head: Normocephalic and atraumatic.  Eyes:     Extraocular Movements: Extraocular movements intact.     Conjunctiva/sclera: Conjunctivae normal.     Pupils: Pupils are equal, round, and reactive to light.  Neck:     Musculoskeletal: Normal range of motion and neck supple.  Cardiovascular:     Rate and Rhythm: Normal rate.     Pulses: Normal pulses.     Heart sounds: Normal heart sounds.  Pulmonary:     Effort: Pulmonary effort is normal.     Breath sounds: Normal breath sounds.  Abdominal:     General: Bowel sounds are normal. There is no distension.     Palpations: Abdomen is soft. There is no mass.     Tenderness: There is no abdominal tenderness.  Musculoskeletal: Normal range of motion.        General: No swelling or tenderness.     Right lower leg: No edema.     Left lower leg: No edema.  Skin:    General: Skin is warm and dry.     Capillary Refill: Capillary refill takes less than 2 seconds.  Neurological:     General: No focal deficit present.     Mental Status: She is alert and oriented to person, place, and time.  Psychiatric:        Mood and Affect: Mood normal.        Behavior: Behavior normal.      ASSESSMENT & PLAN: [place] was seen today for annual exam and medication refill.  Diagnoses and all orders for this visit:  Routine general medical examination at a health care facility  Hypertension associated with diabetes ([place]) -  lisinopril-hydrochlorothiazide (ZESTORETIC) 10-12.5 MG tablet; Take 1 tablet by mouth daily. -     Comprehensive metabolic panel -     Hemoglobin A1c  Dyslipidemia associated with type 2 diabetes mellitus (HCC) -     Hemoglobin A1c -     Lipid panel  History of sarcoidosis  History of sleep apnea  History of gastroesophageal reflux (GERD)    Patient Instructions       If you have lab work done today you will be contacted with your lab results within the next 2 weeks.   If you have not heard from [place] then please contact us. The fastest way to get your results is to register for My Chart.   IF you received an x-ray today, you will receive an invoice from [facility]. Please contact [facility] at [phone] with questions or concerns regarding your invoice.   IF you received labwork today, you will receive an invoice from [place]. Please contact [facility] at [phone] with questions or concerns regarding your invoice.   Our billing staff will not be able to assist you with questions regarding bills from these companies.  You will be contacted with the lab results as soon as they are available. The fastest way to get your results is to activate your My Chart account. Instructions are located on the last page of this paperwork. If you have not heard from [place] regarding the results in 2 weeks, please contact this office.      Health Maintenance, Female Adopting a healthy lifestyle and getting preventive care are important in promoting health and wellness. Ask your health care provider about:  The right schedule for you to have regular tests and exams.  Things you can do on your own to prevent diseases and keep yourself healthy. What should I know about diet, weight, and exercise? Eat a healthy diet   Eat a diet that includes plenty of vegetables, fruits, low-fat dairy products, and lean protein.  Do not eat a lot of foods that are high in solid fats, added sugars, or sodium. Maintain a healthy weight Body mass index (BMI) is used to identify weight problems. It estimates body fat based on height and weight. Your health care provider can help determine your BMI and help you achieve or maintain a healthy weight. Get regular exercise Get regular exercise. This is one of the most important things you can do for your health. Most adults should:  Exercise for at least 150 minutes each week. The exercise should increase your heart rate and  make you sweat (moderate-intensity exercise).  Do strengthening exercises at least twice a week. This is in addition to the moderate-intensity exercise.  Spend less time sitting. Even light physical activity can be beneficial. Watch cholesterol and blood lipids Have your blood tested for lipids and cholesterol at 66 years of age, then have this test every 5 years. Have your cholesterol levels checked more often if:  Your lipid or cholesterol levels are high.  You are older than 66 years of age.  You are at high risk for heart disease. What should I know about cancer screening? Depending on your health history and family history, you may need to have cancer screening at various ages. This may include screening for:  Breast cancer.  Cervical cancer.  Colorectal cancer.  Skin cancer.  Lung cancer. What should I know about heart disease, diabetes, and high blood pressure? Blood pressure and heart disease  High blood pressure causes heart disease and  increases the risk of stroke. This is more likely to develop in people who have high blood pressure readings, are of African descent, or are overweight.  Have your blood pressure checked: ? Every 3-5 years if you are 52-32 years of age. ? Every year if you are 81 years old or older. Diabetes Have regular diabetes screenings. This checks your fasting blood sugar level. Have the screening done:  Once every three years after age 57 if you are at a normal weight and have a low risk for diabetes.  More often and at a younger age if you are overweight or have a high risk for diabetes. What should I know about preventing infection? Hepatitis B If you have a higher risk for hepatitis B, you should be screened for this virus. Talk with your health care provider to find out if you are at risk for hepatitis B infection. Hepatitis C Testing is recommended for:  Everyone born from [birth year] through 1963.  Anyone with known risk factors for  hepatitis C. Sexually transmitted infections (STIs)  Get screened for STIs, including gonorrhea and chlamydia, if: ? You are sexually active and are younger than 66 years of age. ? You are older than 66 years of age and your health care provider tells you that you are at risk for this type of infection. ? Your sexual activity has changed since you were last screened, and you are at increased risk for chlamydia or gonorrhea. Ask your health care provider if you are at risk.  Ask your health care provider about whether you are at high risk for HIV. Your health care provider may recommend a prescription medicine to help prevent HIV infection. If you choose to take medicine to prevent HIV, you should first get tested for HIV. You should then be tested every 3 months for as long as you are taking the medicine. Pregnancy  If you are about to stop having your period (premenopausal) and you may become pregnant, seek counseling before you get pregnant.  Take 400 to 800 micrograms (mcg) of folic acid every day if you become pregnant.  Ask for birth control (contraception) if you want to prevent pregnancy. Osteoporosis and menopause Osteoporosis is a disease in which the bones lose minerals and strength with aging. This can result in bone fractures. If you are 38 years old or older, or if you are at risk for osteoporosis and fractures, ask your health care provider if you should:  Be screened for bone loss.  Take a calcium or vitamin D supplement to lower your risk of fractures.  Be given hormone replacement therapy (HRT) to treat symptoms of menopause. Follow these instructions at home: Lifestyle  Do not use any products that contain nicotine or tobacco, such as cigarettes, e-cigarettes, and chewing tobacco. If you need help quitting, ask your health care provider.  Do not use street drugs.  Do not share needles.  Ask your health care provider for help if you need support or information about  quitting drugs. Alcohol use  Do not drink alcohol if: ? Your health care provider tells you not to drink. ? You are pregnant, may be pregnant, or are planning to become pregnant.  If you drink alcohol: ? Limit how much you use to 0-1 drink a day. ? Limit intake if you are breastfeeding.  Be aware of how much alcohol is in your drink. In the [place] one drink equals one 12 oz bottle of beer (355 mL), one 5 oz  glass of wine (148 mL), or one 1 oz glass of hard liquor (44 mL). General instructions  Schedule regular health, dental, and eye exams.  Stay current with your vaccines.  Tell your health care provider if: ? You often feel depressed. ? You have ever been abused or do not feel safe at home. Summary  Adopting a healthy lifestyle and getting preventive care are important in promoting health and wellness.  Follow your health care provider's instructions about healthy diet, exercising, and getting tested or screened for diseases.  Follow your health care provider's instructions on monitoring your cholesterol and blood pressure. This information is not intended to replace advice given to you by your health care provider. Make sure you discuss any questions you have with your health care provider. Document Released: 06/12/2011 Document Revised: 11/20/2018 Document Reviewed: 11/20/2018 Elsevier Patient Education  2020 Elsevier Inc.      Agustina Caroli, MD [facility]

## 2019-10-29 ENCOUNTER — Encounter: Payer: Self-pay | Admitting: Emergency Medicine

## 2019-10-29 LAB — LIPID PANEL
Chol/HDL Ratio: 2.5 ratio (ref 0.0–4.4)
Cholesterol, Total: 119 mg/dL (ref 100–199)
HDL: 48 mg/dL (ref >39)
LDL Chol Calc (NIH): 58 mg/dL (ref 0–99)
Triglycerides: 59 mg/dL (ref 0–149)
VLDL Cholesterol Cal: 13 mg/dL (ref 5–40)

## 2019-10-29 LAB — COMPREHENSIVE METABOLIC PANEL
ALT: 28 IU/L (ref 0–32)
AST: 17 IU/L (ref 0–40)
Albumin/Globulin Ratio: 1.4 (ref 1.2–2.2)
Albumin: 4.2 g/dL (ref 3.8–4.8)
Alkaline Phosphatase: 101 IU/L (ref 39–117)
BUN/Creatinine Ratio: 15 (ref 12–28)
BUN: 16 mg/dL (ref 8–27)
Bilirubin Total: 0.2 mg/dL (ref 0.0–1.2)
CO2: 23 mmol/L (ref 20–29)
Calcium: 9.9 mg/dL (ref 8.7–10.3)
Chloride: 104 mmol/L (ref 96–106)
Creatinine, Ser: 1.04 mg/dL — ABNORMAL HIGH (ref 0.57–1.00)
GFR calc Af Amer: 65 mL/min/{1.73_m2} (ref >59)
GFR calc non Af Amer: 56 mL/min/{1.73_m2} — ABNORMAL LOW (ref >59)
Globulin, Total: 3.1 g/dL (ref 1.5–4.5)
Glucose: 82 mg/dL (ref 65–99)
Potassium: 4.3 mmol/L (ref 3.5–5.2)
Sodium: 141 mmol/L (ref 134–144)
Total Protein: 7.3 g/dL (ref 6.0–8.5)

## 2019-10-29 LAB — HEMOGLOBIN A1C
Est. average glucose Bld gHb Est-mCnc: 126 mg/dL
Hgb A1c MFr Bld: 6 % — ABNORMAL HIGH (ref 4.8–5.6)

## 2019-10-30 DIAGNOSIS — H35352 Cystoid macular degeneration, left eye: Secondary | ICD-10-CM | POA: Diagnosis not present

## 2019-10-30 DIAGNOSIS — H35073 Retinal telangiectasis, bilateral: Secondary | ICD-10-CM | POA: Diagnosis not present

## 2019-10-30 DIAGNOSIS — G4733 Obstructive sleep apnea (adult) (pediatric): Secondary | ICD-10-CM | POA: Diagnosis not present

## 2019-11-11 DIAGNOSIS — H35352 Cystoid macular degeneration, left eye: Secondary | ICD-10-CM | POA: Diagnosis not present

## 2019-11-11 DIAGNOSIS — G4733 Obstructive sleep apnea (adult) (pediatric): Secondary | ICD-10-CM | POA: Diagnosis not present

## 2019-11-11 DIAGNOSIS — H35073 Retinal telangiectasis, bilateral: Secondary | ICD-10-CM | POA: Diagnosis not present

## 2019-11-12 ENCOUNTER — Telehealth: Payer: Self-pay | Admitting: Internal Medicine

## 2019-11-12 NOTE — Telephone Encounter (Signed)
Spoke to pt she is aware we are really backed up HST and will call her as soon as we can Joellen [place]

## 2019-12-01 ENCOUNTER — Ambulatory Visit: Payer: Medicare HMO | Admitting: Emergency Medicine

## 2019-12-09 ENCOUNTER — Ambulatory Visit
Admission: RE | Admit: 2019-12-09 | Discharge: 2019-12-09 | Disposition: A | Discharge status: Home / Self Care | Payer: Medicare HMO | Source: Ambulatory Visit | Attending: Emergency Medicine | Admitting: Emergency Medicine

## 2019-12-09 ENCOUNTER — Encounter: Payer: Self-pay | Admitting: Emergency Medicine

## 2019-12-09 ENCOUNTER — Other Ambulatory Visit: Payer: Self-pay

## 2019-12-09 DIAGNOSIS — Z1231 Encounter for screening mammogram for malignant neoplasm of breast: Secondary | ICD-10-CM | POA: Diagnosis not present

## 2019-12-29 ENCOUNTER — Ambulatory Visit: Payer: Medicare HMO

## 2019-12-29 ENCOUNTER — Other Ambulatory Visit: Payer: Self-pay

## 2019-12-29 DIAGNOSIS — G4733 Obstructive sleep apnea (adult) (pediatric): Secondary | ICD-10-CM

## 2020-01-08 ENCOUNTER — Other Ambulatory Visit: Payer: Self-pay

## 2020-01-08 ENCOUNTER — Ambulatory Visit: Payer: Medicare HMO

## 2020-01-08 DIAGNOSIS — H35352 Cystoid macular degeneration, left eye: Secondary | ICD-10-CM | POA: Diagnosis not present

## 2020-01-08 DIAGNOSIS — G4733 Obstructive sleep apnea (adult) (pediatric): Secondary | ICD-10-CM | POA: Diagnosis not present

## 2020-01-08 DIAGNOSIS — H35073 Retinal telangiectasis, bilateral: Secondary | ICD-10-CM | POA: Diagnosis not present

## 2020-01-09 DIAGNOSIS — G4733 Obstructive sleep apnea (adult) (pediatric): Secondary | ICD-10-CM | POA: Diagnosis not present

## 2020-01-20 ENCOUNTER — Other Ambulatory Visit: Payer: Self-pay | Admitting: Emergency Medicine

## 2020-01-20 ENCOUNTER — Telehealth: Payer: Self-pay | Admitting: Emergency Medicine

## 2020-01-20 DIAGNOSIS — E1169 Type 2 diabetes mellitus with other specified complication: Secondary | ICD-10-CM

## 2020-01-20 DIAGNOSIS — E119 Type 2 diabetes mellitus without complications: Secondary | ICD-10-CM

## 2020-01-20 MED ORDER — METFORMIN HCL 500 MG PO TABS: 500.0000 mg | ORAL_TABLET | Freq: Two times a day (BID) | ORAL | 1 refills | Status: DC

## 2020-01-20 MED ORDER — ROSUVASTATIN CALCIUM 40 MG PO TABS: 40.0000 mg | ORAL_TABLET | Freq: Every day | ORAL | 3 refills | Status: DC

## 2020-01-20 NOTE — Telephone Encounter (Signed)
Eileen Jefferson from [facility] is calling stating they have requested metFORMIN (GLUCOPHAGE) 500 MG tablet [[id number]] and Rosusvation.  Pharmacy  [facility] - [place], [place] - [address]  [address] [place] [address]  Phone:  [phone] Fax:  [phone]     if any questions [phone] is Eileen Jefferson's #.

## 2020-01-20 NOTE — Telephone Encounter (Signed)
Rx has been sent into [facility]. °

## 2020-01-22 DIAGNOSIS — N182 Chronic kidney disease, stage 2 (mild): Secondary | ICD-10-CM | POA: Diagnosis not present

## 2020-01-22 DIAGNOSIS — I129 Hypertensive chronic kidney disease with stage 1 through stage 4 chronic kidney disease, or unspecified chronic kidney disease: Secondary | ICD-10-CM | POA: Diagnosis not present

## 2020-01-22 DIAGNOSIS — E669 Obesity, unspecified: Secondary | ICD-10-CM | POA: Diagnosis not present

## 2020-01-22 DIAGNOSIS — E1122 Type 2 diabetes mellitus with diabetic chronic kidney disease: Secondary | ICD-10-CM | POA: Diagnosis not present

## 2020-01-23 ENCOUNTER — Encounter: Payer: Self-pay | Admitting: Internal Medicine

## 2020-01-23 ENCOUNTER — Ambulatory Visit: Payer: Medicare HMO | Admitting: Internal Medicine

## 2020-01-23 ENCOUNTER — Other Ambulatory Visit: Payer: Self-pay

## 2020-01-23 VITALS — BP 128/70 | HR 81 | Temp 97.9°F | Ht 62.0 in | Wt 210.0 lb

## 2020-01-23 DIAGNOSIS — G4733 Obstructive sleep apnea (adult) (pediatric): Secondary | ICD-10-CM | POA: Diagnosis not present

## 2020-01-23 DIAGNOSIS — D869 Sarcoidosis, unspecified: Secondary | ICD-10-CM | POA: Diagnosis not present

## 2020-01-23 DIAGNOSIS — D860 Sarcoidosis of lung: Secondary | ICD-10-CM | POA: Diagnosis not present

## 2020-01-23 NOTE — Patient Instructions (Signed)
Order- new DME new CPAP auto 5-20, mask of choice humidifier, supplies, AirView/ card  Please call if we can help

## 2020-01-23 NOTE — Progress Notes (Signed)
HPI F former smoker followed for OSA, complicated by HBP, GERD, DM2 with retinopathy, Macular degeneration, Cervical DDD, Sarcoid/ Iridocyclitis, Glaucoma, Obesity, PSG 10/11/04>>AHI 16.7, SpO2 low 85% PSG 10/31/12>>AHI 12.9, REM 44.8, SpO2 low 59%, PLMI 0.  HST 01/08/20- AHI 17.1/ hr, desaturation to 70%, body weight 211 lbs  ----------------------------------------------------------------------------------------------- 10/23/2019- 68 yoF former smoker for OSA. Had dropped off and coming now to re-establish. Medical problem list includes HBP, GERD, DM2 with retinopathy, Macular degeneration, Cervical DDD, Sarcoid/ Iridocyclitis, Glaucoma, Obesity,  PSG 10/11/04>>AHI 16.7, SpO2 low 85% PSG 10/31/12>>AHI 12.9, REM 44.8, SpO2 low 59%, PLMI 0. Auto CPAP 12/11/12 to 02/04/13 >>  NPSG 10/31/12- AHI 12.9/ hr, desat to 59%, body weight 230 lbs Last saw Dr Craige Cotta in 2015. Had dropped off CPAP years ago and machine is old. Her Ophthalmologist, Dr Luciana Axe, told her it is very important for her eyesight that she get back on CPAP. Husband tells her she snores "all the time". ENT surgery + tonsils Epworth Score 3 Body weight today  211 lbs No routine cough, wheeze or unusual dyspnea.  Had flu vax Discussed Covid precautions and vaccine.   01/23/20- 68 yoF former smoker followed for OSA, complicated by HBP, GERD, DM2 with retinopathy, Macular degeneration, Cervical DDD, Sarcoid/ Iridocyclitis, Glaucoma, Obesity,  HST 01/08/20- AHI 17.1/ hr, desaturation to 70%, body weight 211 lbs Body weight today 210 lbs Sleep study reviewed and she chooses to retry CPAP. Education done. Second covid vax today.  No significant cough or wheeze.  CXR 08/16/18-  The cardiac and mediastinal silhouettes are stable in size and contour, and remain within normal limits. The lungs are normally inflated. No airspace consolidation, pleural effusion, or pulmonary edema is identified. There is no pneumothorax. No acute osseous  abnormality identified. Mild thoracic dextroscoliosis. IMPRESSION: No active cardiopulmonary disease.  // Consider PFT- former smoker//  ROS-see HPI   + = positive Constitutional:    weight loss, night sweats, fevers, chills, fatigue, lassitude. HEENT:    headaches, difficulty swallowing, tooth/dental problems, sore throat,       sneezing, itching, ear ache, nasal congestion, post nasal drip, snoring CV:    chest pain, orthopnea, PND, swelling in lower extremities, anasarca,                                  dizziness, palpitations Resp:   shortness of breath with exertion or at rest.                productive cough,   non-productive cough, coughing up of blood.              change in color of mucus.  wheezing.   Skin:    rash or lesions. GI:    +heartburn, indigestion, abdominal pain, nausea, vomiting, diarrhea,                 change in bowel habits, loss of appetite GU: dysuria, change in color of urine, no urgency or frequency.   flank pain. MS:   joint pain, stiffness, decreased range of motion, back pain. Neuro-     nothing unusual Psych:  change in mood or affect.  depression or anxiety.   memory loss.  OBJ- Physical Exam General- Alert, Oriented, Affect-appropriate, Distress- none acute, + obese Skin- rash-none, lesions- none, excoriation- none Lymphadenopathy- none Head- atraumatic            Eyes- Gross vision intact, PERRLA, conjunctivae and  secretions clear            Ears- Hearing, canals-normal            Nose- Clear, no-Septal dev, mucus, polyps, erosion, perforation             Throat- Mallampati IV , mucosa clear , drainage- none, tonsils- atrophic,  + teeth Neck- flexible , trachea midline, no stridor , thyroid nl, carotid no bruit Chest - symmetrical excursion , unlabored           Heart/CV- RRR , no murmur , no gallop  , no rub, nl s1 s2                           - JVD- none , edema- none, stasis changes- none, varices- none           Lung- clear to P&A,  wheeze- none, cough- none , dullness-none, rub- none           Chest wall-  Abd-  Br/ Gen/ Rectal- Not done, not indicated Extrem- cyanosis- none, clubbing, none, atrophy- none, strength- nl Neuro- grossly intact to observation

## 2020-01-24 NOTE — Assessment & Plan Note (Signed)
Restart DME and CPAP auto 5-20. Discussed availability of mask fitting and CPAP desensitization at sleep center.

## 2020-01-24 NOTE — Assessment & Plan Note (Addendum)
No active disease in the lung. At this age pulmonary sarcoid is likely burned out with no significant adenopathy or scarring.

## 2020-02-12 DIAGNOSIS — G4733 Obstructive sleep apnea (adult) (pediatric): Secondary | ICD-10-CM | POA: Diagnosis not present

## 2020-03-08 IMAGING — MG DIGITAL SCREENING BILAT W/ TOMO W/ CAD
8 series · 8 of 24 positions shown · non-contrast
Comparison: Previous exam(s).

CLINICAL DATA: Screening.

EXAM:
DIGITAL SCREENING BILATERAL MAMMOGRAM WITH TOMO AND CAD

[L MLO synth-2D]
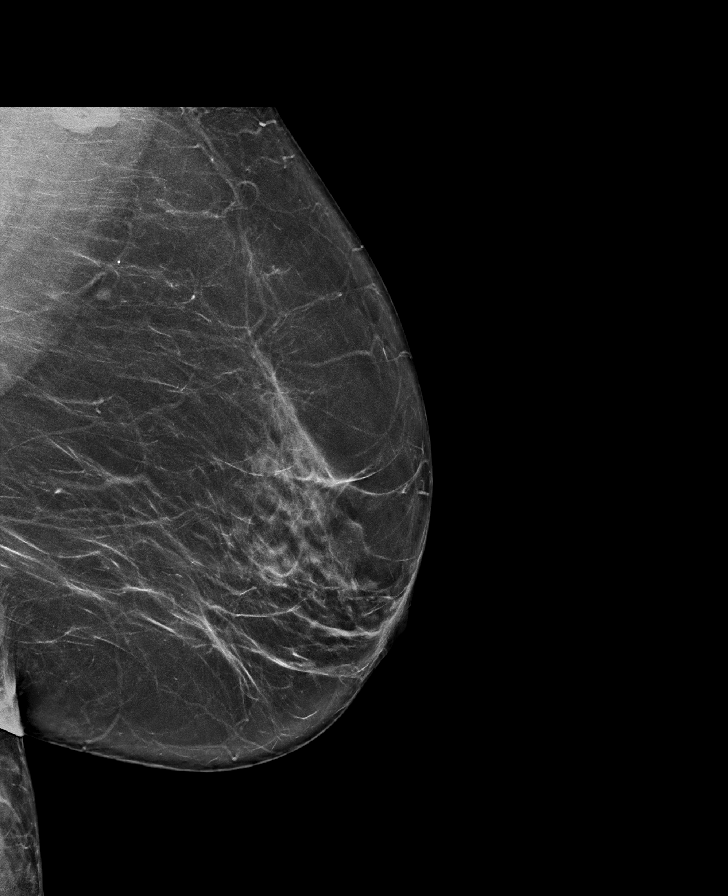

[R MLO synth-2D]
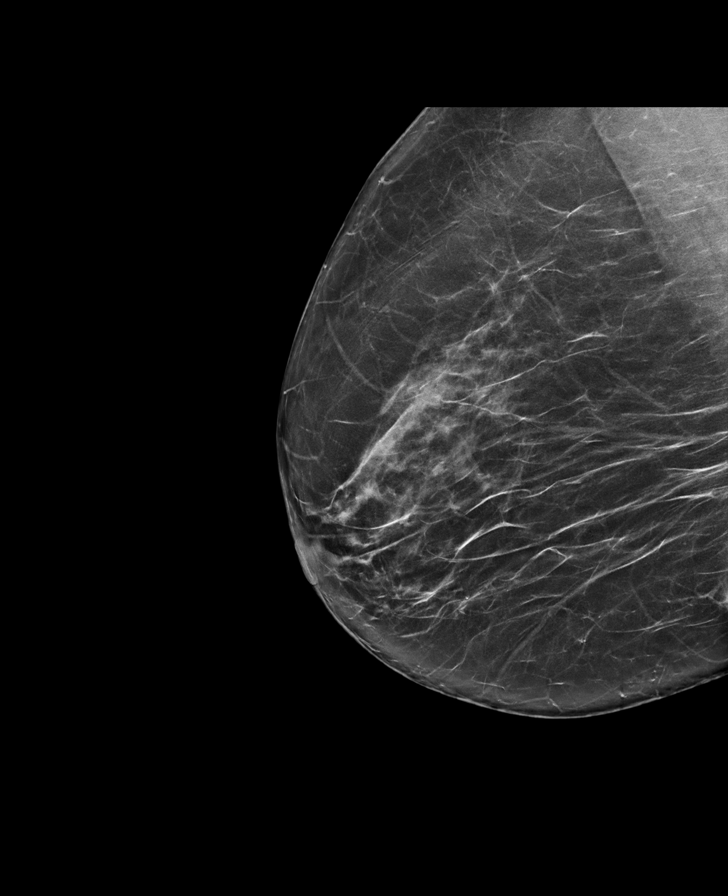

[R CC synth-2D]
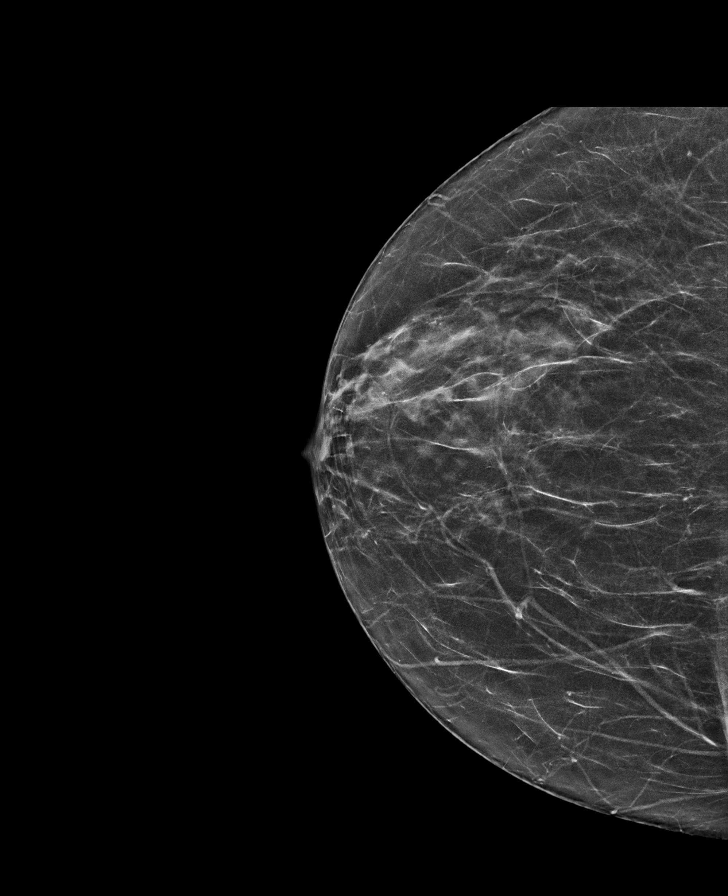

[L CC synth-2D]
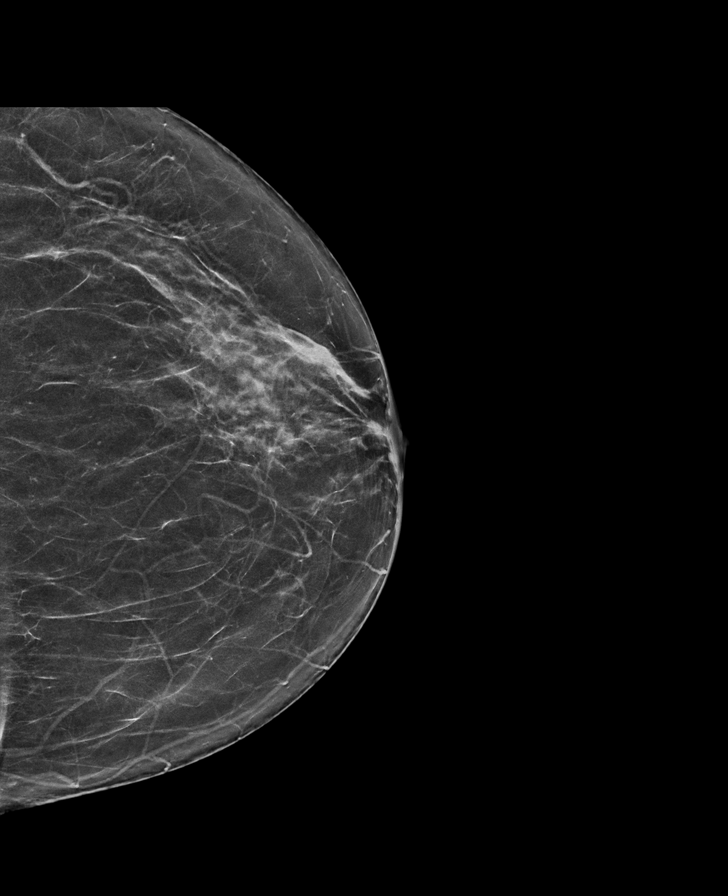

[L MLO tomo · tomo slice 41/81.0]
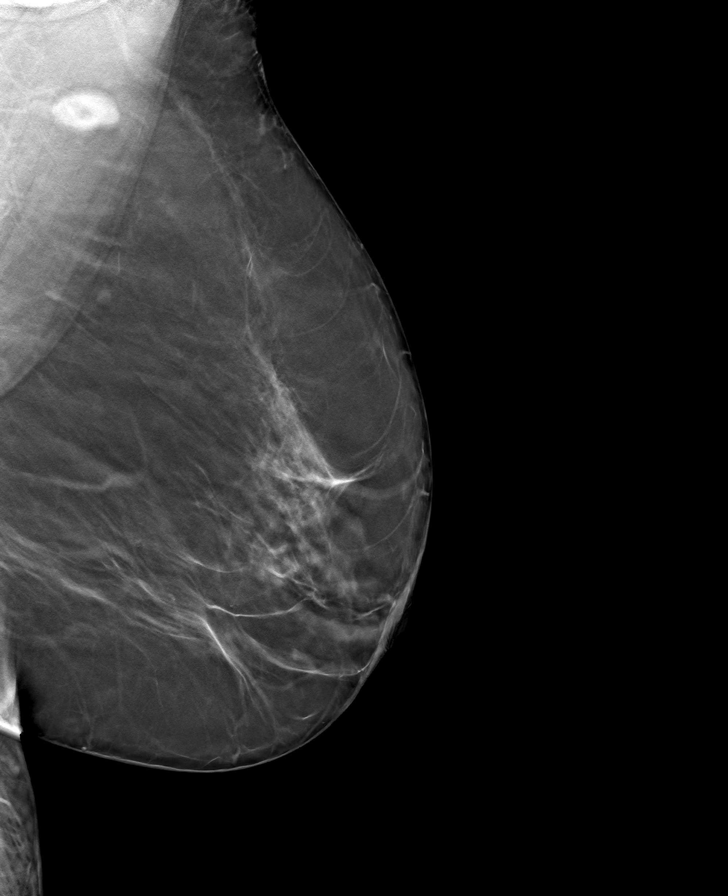

[L CC tomo · tomo slice 33/64.0]
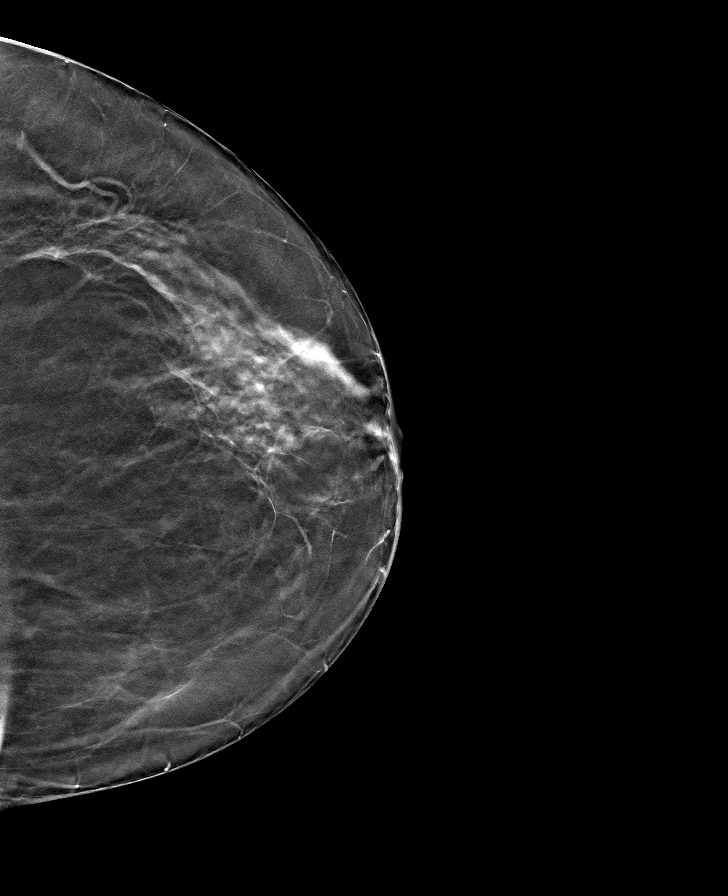

[R MLO tomo · tomo slice 39/76.0]
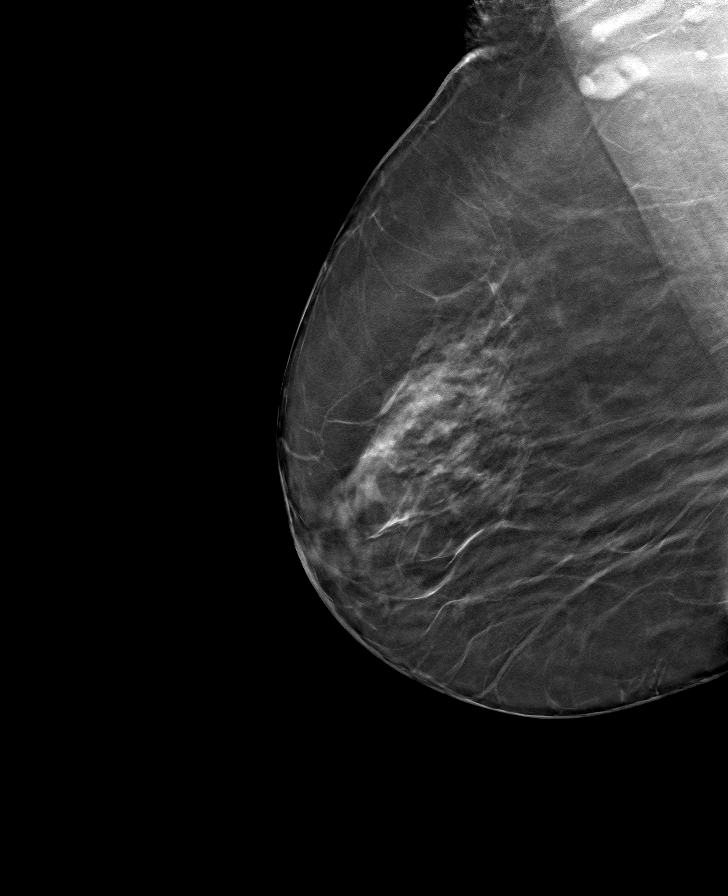

[R CC tomo · tomo slice 32/63.0]
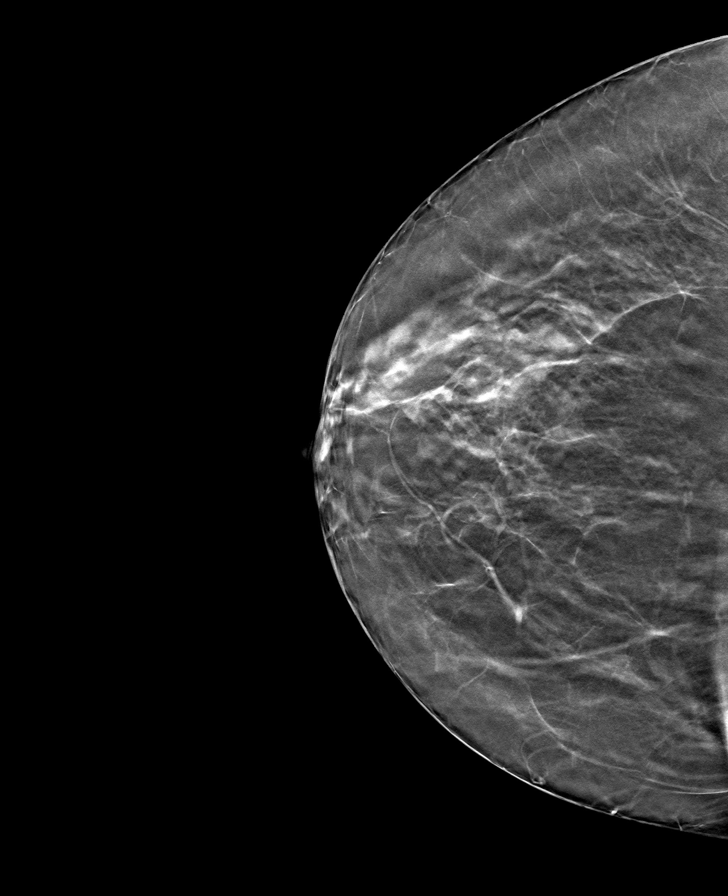

[8 of 24 positions shown; findings below may reference images not displayed]

ACR Breast Density Category b: There are scattered areas of
fibroglandular density.
FINDINGS: There are no findings suspicious for malignancy. Images were
processed with CAD.
IMPRESSION: No mammographic evidence of malignancy. A result letter of this
screening mammogram will be mailed directly to the patient.

RECOMMENDATION:
Screening mammogram in one year. (Code:[id number])

BI-RADS CATEGORY  1: Negative.

## 2020-03-14 DIAGNOSIS — G4733 Obstructive sleep apnea (adult) (pediatric): Secondary | ICD-10-CM | POA: Diagnosis not present

## 2020-03-25 DIAGNOSIS — G4733 Obstructive sleep apnea (adult) (pediatric): Secondary | ICD-10-CM | POA: Diagnosis not present

## 2020-04-13 DIAGNOSIS — G4733 Obstructive sleep apnea (adult) (pediatric): Secondary | ICD-10-CM | POA: Diagnosis not present

## 2020-04-21 ENCOUNTER — Other Ambulatory Visit: Payer: Self-pay

## 2020-04-21 ENCOUNTER — Encounter: Payer: Self-pay | Admitting: Internal Medicine

## 2020-04-21 ENCOUNTER — Ambulatory Visit: Payer: Medicare HMO | Admitting: Internal Medicine

## 2020-04-21 DIAGNOSIS — G4733 Obstructive sleep apnea (adult) (pediatric): Secondary | ICD-10-CM | POA: Diagnosis not present

## 2020-04-21 DIAGNOSIS — D869 Sarcoidosis, unspecified: Secondary | ICD-10-CM | POA: Diagnosis not present

## 2020-04-21 NOTE — Patient Instructions (Signed)
We can continue CPAP auto 5-20, mask of choice,humidifier, supplies, Airview/ mask  Please call if we can help

## 2020-04-21 NOTE — Assessment & Plan Note (Signed)
Benefits from CPAP with good compliance and control Plan- Continue auto 5-20 

## 2020-04-21 NOTE — Progress Notes (Signed)
HPI F former smoker followed for OSA, complicated by HBP, GERD, DM2 with retinopathy, Macular degeneration, Cervical DDD, Sarcoid/ Iridocyclitis, Glaucoma, Obesity, PSG 10/11/04>>AHI 16.7, SpO2 low 85% PSG 10/31/12>>AHI 12.9, REM 44.8, SpO2 low 59%, PLMI 0.  HST 01/08/20- AHI 17.1/ hr, desaturation to 70%, body weight 211 lbs  -----------------------------------------------------------------------------------------------  01/23/20- 68 yoF former smoker followed for OSA, complicated by HBP, GERD, DM2 with retinopathy, Macular degeneration, Cervical DDD, Sarcoid/ Iridocyclitis, Glaucoma, Obesity,  HST 01/08/20- AHI 17.1/ hr, desaturation to 70%, body weight 211 lbs Body weight today 210 lbs Sleep study reviewed and she chooses to retry CPAP. Education done. Second covid vax today.  No significant cough or wheeze.  CXR 08/16/18-  The cardiac and mediastinal silhouettes are stable in size and contour, and remain within normal limits. The lungs are normally inflated. No airspace consolidation, pleural effusion, or pulmonary edema is identified. There is no pneumothorax. No acute osseous abnormality identified. Mild thoracic dextroscoliosis. IMPRESSION: No active cardiopulmonary disease.  04/21/20-  72 yoF former smoker followed for OSA, complicated by HBP, GERD, DM2 with retinopathy, Macular degeneration, Cervical DDD, Sarcoid/ Iridocyclitis, Glaucoma, Obesity, -----f/u OSA Body weight today 212 lbs CPAP auto 5-20/ [employer] Download compliance 97%, AHI 0.4/ hr 2  Phizer Covax No complaints. Better with CPAP. Reviewed download.  Denies interval health concerns.    ROS-see HPI   + = positive Constitutional:    weight loss, night sweats, fevers, chills, fatigue, lassitude. HEENT:    headaches, difficulty swallowing, tooth/dental problems, sore throat,       sneezing, itching, ear ache, nasal congestion, post nasal drip, snoring CV:    chest pain, orthopnea, PND, swelling in lower  extremities, anasarca,                                  dizziness, palpitations Resp:   shortness of breath with exertion or at rest.                productive cough,   non-productive cough, coughing up of blood.              change in color of mucus.  wheezing.   Skin:    rash or lesions. GI:    +heartburn, indigestion, abdominal pain, nausea, vomiting, diarrhea,                 change in bowel habits, loss of appetite GU: dysuria, change in color of urine, no urgency or frequency.   flank pain. MS:   joint pain, stiffness, decreased range of motion, back pain. Neuro-     nothing unusual Psych:  change in mood or affect.  depression or anxiety.   memory loss.  OBJ- Physical Exam General- Alert, Oriented, Affect-appropriate, Distress- none acute, + obese Skin- rash-none, lesions- none, excoriation- none Lymphadenopathy- none Head- atraumatic            Eyes- Gross vision intact, PERRLA, conjunctivae and secretions clear            Ears- Hearing, canals-normal            Nose- Clear, no-Septal dev, mucus, polyps, erosion, perforation             Throat- Mallampati IV , mucosa clear , drainage- none, tonsils- atrophic,  + teeth Neck- flexible , trachea midline, no stridor , thyroid nl, carotid no bruit Chest - symmetrical excursion , unlabored  Heart/CV- RRR , no murmur , no gallop  , no rub, nl s1 s2                           - JVD- none , edema- none, stasis changes- none, varices- none           Lung- clear to P&A, wheeze- none, cough- none , dullness-none, rub- none           Chest wall-  Abd-  Br/ Gen/ Rectal- Not done, not indicated Extrem- cyanosis- none, clubbing, none, atrophy- none, strength- nl Neuro- grossly intact to observation

## 2020-04-21 NOTE — Assessment & Plan Note (Signed)
No longer systemically active

## 2020-04-26 ENCOUNTER — Ambulatory Visit (INDEPENDENT_AMBULATORY_CARE_PROVIDER_SITE_OTHER): Payer: Medicare HMO | Admitting: Emergency Medicine

## 2020-04-26 ENCOUNTER — Encounter: Payer: Self-pay | Admitting: Emergency Medicine

## 2020-04-26 ENCOUNTER — Telehealth: Payer: Self-pay | Admitting: *Deleted

## 2020-04-26 ENCOUNTER — Other Ambulatory Visit: Payer: Self-pay

## 2020-04-26 VITALS — BP 109/72 | HR 85 | Temp 98.0°F | Resp 16 | Ht 63.0 in | Wt 210.0 lb

## 2020-04-26 DIAGNOSIS — E1169 Type 2 diabetes mellitus with other specified complication: Secondary | ICD-10-CM | POA: Diagnosis not present

## 2020-04-26 DIAGNOSIS — M109 Gout, unspecified: Secondary | ICD-10-CM | POA: Diagnosis not present

## 2020-04-26 DIAGNOSIS — I152 Hypertension secondary to endocrine disorders: Secondary | ICD-10-CM

## 2020-04-26 DIAGNOSIS — E785 Hyperlipidemia, unspecified: Secondary | ICD-10-CM | POA: Diagnosis not present

## 2020-04-26 DIAGNOSIS — E1159 Type 2 diabetes mellitus with other circulatory complications: Secondary | ICD-10-CM | POA: Diagnosis not present

## 2020-04-26 DIAGNOSIS — Z8669 Personal history of other diseases of the nervous system and sense organs: Secondary | ICD-10-CM

## 2020-04-26 DIAGNOSIS — E119 Type 2 diabetes mellitus without complications: Secondary | ICD-10-CM

## 2020-04-26 DIAGNOSIS — Z8719 Personal history of other diseases of the digestive system: Secondary | ICD-10-CM

## 2020-04-26 DIAGNOSIS — E7800 Pure hypercholesterolemia, unspecified: Secondary | ICD-10-CM

## 2020-04-26 DIAGNOSIS — Z23 Encounter for immunization: Secondary | ICD-10-CM | POA: Diagnosis not present

## 2020-04-26 DIAGNOSIS — Z6838 Body mass index (BMI) 38.0-38.9, adult: Secondary | ICD-10-CM

## 2020-04-26 DIAGNOSIS — Z862 Personal history of diseases of the blood and blood-forming organs and certain disorders involving the immune mechanism: Secondary | ICD-10-CM | POA: Diagnosis not present

## 2020-04-26 DIAGNOSIS — I10 Essential (primary) hypertension: Secondary | ICD-10-CM

## 2020-04-26 DIAGNOSIS — Z1211 Encounter for screening for malignant neoplasm of colon: Secondary | ICD-10-CM

## 2020-04-26 MED ORDER — ROSUVASTATIN CALCIUM 40 MG PO TABS: 40.0000 mg | ORAL_TABLET | Freq: Every day | ORAL | 3 refills | Status: DC

## 2020-04-26 MED ORDER — METFORMIN HCL 500 MG PO TABS: 500.0000 mg | ORAL_TABLET | Freq: Two times a day (BID) | ORAL | 3 refills | Status: DC

## 2020-04-26 MED ORDER — ACCU-CHEK AVIVA PLUS VI STRP: ORAL_STRIP | 2 refills | Status: DC

## 2020-04-26 MED ORDER — COLCHICINE 0.6 MG PO TABS: ORAL_TABLET | ORAL | 1 refills | Status: DC

## 2020-04-26 MED ORDER — ACCU-CHEK SOFTCLIX LANCETS MISC: 2 refills | Status: DC

## 2020-04-26 MED ORDER — LISINOPRIL-HYDROCHLOROTHIAZIDE 10-12.5 MG PO TABS: 1.0000 | ORAL_TABLET | Freq: Every day | ORAL | 3 refills | Status: DC

## 2020-04-26 NOTE — Addendum Note (Signed)
Addended by: Georg Ruddle A on: 04/26/2020 08:56 AM   Modules accepted: Orders

## 2020-04-26 NOTE — Addendum Note (Signed)
Addended by: Georg Ruddle A on: 04/26/2020 10:30 AM   Modules accepted: Orders

## 2020-04-26 NOTE — Telephone Encounter (Signed)
Called [facility] Delivery pharmacy spoke to [facility] to cancel Colchicine 0.6 mg. Patient wanted medication to be sent to local [facility] pharmacy, I  reordered medication.

## 2020-04-26 NOTE — Progress Notes (Signed)
Eileen Jefferson 69 y.o.   Chief Complaint  Patient presents with  . Diabetes  . Medication Refill    PEND    HISTORY OF PRESENT ILLNESS: This is a 69 y.o. female here for 21-monthfollow-up of chronic medical problems. Also complaining of pain to right big toe that started today. Patient has several chronic medical problems: 1.  Diabetes type 2: On metformin 500 mg twice a day.  Normal glucose readings at home. 2.  Hypertension: On Zestoretic 10-12.5 mg daily.  Normal blood pressure readings at home. 3.  Dyslipidemia: On Crestor 40 mg daily. 4.  Sarcoidosis: Under control. 5.  Sleep apnea. 6.  GERD: On omeprazole 40 mg daily. HPI   Prior to Admission medications   Medication Sig Start Date End Date Taking? Authorizing Provider  ACCU-CHEK SOFTCLIX LANCETS lancets Test blood sugar once daily. Dx: E11.9 10/25/16  Yes SShawnee Knapp MD  Alcohol Swabs PADS Test blood sugar once daily. Dx: E11.9 10/25/16  Yes SShawnee Knapp MD  aspirin EC 81 MG tablet Take 81 mg by mouth daily.   Yes [provider]  Blood Glucose Calibration (ACCU-CHEK AVIVA) SOLN Test blood sugar once daily. Dx: E11.9 10/25/16  Yes SShawnee Knapp MD  Blood Glucose Monitoring Suppl (ACCU-CHEK AVIVA PLUS) w/Device KIT Test blood sugar once daily. Dx: E11.9 10/25/16  Yes SShawnee Knapp MD  brimonidine-timolol (COMBIGAN) 0.2-0.5 % ophthalmic solution Place 1 drop into the left eye 2 (two) times daily.     Yes [provider]  brinzolamide (AZOPT) 1 % ophthalmic suspension Place 1 drop into the left eye 2 (two) times daily.     Yes [provider]  Continuous Blood Gluc Receiver (FREESTYLE LIBRE READER) DEVI 1 application by Does not apply route as needed. 06/04/19  Yes Malory Spurr, MInes Bloomer MD  Continuous Blood Gluc Sensor ([place] MISC 1 application by Does not apply route as needed. 06/04/19  Yes Amine Adelson, MInes Bloomer MD  famotidine (PEPCID) 40 MG tablet Take 1 tablet (40  mg total) by mouth 2 (two) times daily as needed for heartburn or indigestion. 09/22/19  Yes Talaysia Pinheiro, MInes Bloomer MD  glucose blood (ACCU-CHEK AVIVA PLUS) test strip Test blood sugar once daily. Dx: E11.9 10/25/16  Yes SShawnee Knapp MD  lisinopril-hydrochlorothiazide (ZESTORETIC) 10-12.5 MG tablet Take 1 tablet by mouth daily. 10/28/19  Yes Zayed Griffie, MInes Bloomer MD  metFORMIN (GLUCOPHAGE) 500 MG tablet Take 1 tablet (500 mg total) by mouth 2 (two) times daily with a meal. 01/20/20  Yes Madalina Rosman, MInes Bloomer MD  omeprazole (PRILOSEC) 40 MG capsule Take 1 capsule (40 mg total) by mouth daily. 30-60 minutes before breakfast 09/22/19  Yes Jadira Nierman, MInes Bloomer MD  rosuvastatin (CRESTOR) 40 MG tablet Take 1 tablet (40 mg total) by mouth daily. 01/20/20  Yes SHorald Pollen MD    Allergies  Allergen Reactions  . Contrast Media [Iodinated Diagnostic Agents]   . Iohexol Other (See Comments)    " made me feel like I was burning inside"    Patient Active Problem List   Diagnosis Date Noted  . Gastroesophageal reflux disease 11/23/2018  . DDD (degenerative disc disease), cervical 07/11/2017  . Localized osteoarthrosis of right shoulder region 07/11/2017  . Anemia 07/11/2017  . Hyperlipidemia 06/28/2016  . Thyroid disease 06/28/2016  . Diabetic retinopathy ([place] 05/05/2015  . Obesity 01/11/2015  . Glaucoma 04/06/2014  . Macular degeneration 04/06/2014  . 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  HTN (hypertension) 10/02/2013  . Sarcoidosis 10/02/2013  . Diabetes mellitus, type 2 ([place]) 10/02/2013  . OSA (obstructive sleep apnea) 10/23/2012    Past Medical History:  Diagnosis Date  . DDD (degenerative disc disease), cervical 07/11/2017   C5-6 and C6-7 cervical spondylosis and degenerative disc disease on 2005 Xray  . Diverticulosis   . Glaucoma 04/06/2014  . HTN (hypertension)   . Hyperlipidemia   . 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 Iron  deficiency anemia   . Pulmonary sarcoidosis ([place])   . Sleep apnea   . Thyroid disease   . 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)     Past Surgical History:  Procedure Laterality Date  . APPENDECTOMY    . c-section     x 2  . CATARACT EXTRACTION     bilateral  . SCLERAL BUCKLE     right  . TONSILLECTOMY    . TOTAL ABDOMINAL HYSTERECTOMY    . TUBAL LIGATION    . VITRECTOMY     bilateral    Social History   Socioeconomic History  . Marital status: Married    Spouse name: Not on file  . Number of children: 2  . Years of education: Not on file  . Highest education level: Not on file  Occupational History  . Occupation: behavioral health  Tobacco Use  . Smoking status: Former Smoker    Packs/day: 0.20    Years: 1.00    Pack years: 0.20    Types: Cigarettes    Quit date: 12/11/1968    Years since quitting: 51.4  . Smokeless tobacco: Never Used  Substance and Sexual Activity  . Alcohol use: No  . Drug use: No  . Sexual activity: Not on file  Other Topics Concern  . Not on file  Social History Narrative  . Not on file   Social Determinants of Health   Financial Resource Strain:   . Difficulty of Paying Living Expenses:   Food Insecurity:   . Worried About Charity fundraiser in the Last Year:   . Arboriculturist in the Last Year:   Transportation Needs:   . Film/video editor (Medical):   Marland Kitchen Lack of Transportation (Non-Medical):   Physical Activity:   . Days of Exercise per Week:   . Minutes of Exercise per Session:   Stress:   . Feeling of Stress :   Social Connections:   . Frequency of Communication with Friends and Family:   . Frequency of Social Gatherings with Friends and Family:   . Attends Religious Services:   . Active Member of Clubs or Organizations:   . Attends Archivist Meetings:   Marland Kitchen Marital Status:   Intimate Partner Violence:   . Fear of Current or Ex-Partner:   . Emotionally Abused:    Marland Kitchen Physically Abused:   . Sexually Abused:     Family History  Problem Relation Age of Onset  . Colon polyps Mother        brothers x2  . Clotting disorder Mother        brother x 2, MGM  . Heart disease Brother        mother, MGM  . Diabetes Brother        x 2, MGM  . Stroke Maternal Grandmother   . Colon cancer Neg Hx   . Breast cancer Neg Hx      Review of Systems  Constitutional: Negative.  Negative for chills and fever.  HENT: Negative.  Negative for congestion and sore throat.   Respiratory: Negative.  Negative for cough and shortness of breath.   Cardiovascular: Negative.  Negative for chest pain and palpitations.  Gastrointestinal: Negative.  Negative for abdominal pain, blood in stool, diarrhea, melena, nausea and vomiting.  Genitourinary: Negative.  Negative for hematuria.  Musculoskeletal: Positive for joint pain (Right big toe). Negative for back pain, myalgias and neck pain.  Skin: Negative.  Negative for rash.  Neurological: Negative.  Negative for dizziness and headaches.  Endo/Heme/Allergies: Negative.   All other systems reviewed and are negative.  Today's Vitals   04/26/20 0812  BP: 109/72  Pulse: 85  Resp: 16  Temp: 98 F (36.7 C)  TempSrc: Temporal  SpO2: 97%  Weight: 210 lb (95.3 kg)  Height: _0  (1.6 m)   Body mass index is 37.2 kg/m.   Physical Exam Vitals reviewed.  Constitutional:      Appearance: Normal appearance.  HENT:     Head: Normocephalic.  Eyes:     Extraocular Movements: Extraocular movements intact.     Pupils: Pupils are equal, round, and reactive to light.  Cardiovascular:     Rate and Rhythm: Regular rhythm.     Pulses: Normal pulses.     Heart sounds: Normal heart sounds.  Pulmonary:     Effort: Pulmonary effort is normal.     Breath sounds: Normal breath sounds.  Musculoskeletal:     Cervical back: Normal range of motion and neck supple.     Comments: Right foot: Positive erythema, swelling, and tenderness  to first MTP joint compatible with gout  Skin:    General: Skin is warm and dry.     Capillary Refill: Capillary refill takes less than 2 seconds.  Neurological:     General: No focal deficit present.     Mental Status: She is alert and oriented to person, place, and time.  Psychiatric:        Mood and Affect: Mood normal.        Behavior: Behavior normal.        ASSESSMENT & PLAN: [place] was seen today for diabetes and medication refill.  Diagnoses and all orders for this visit:  Hypertension associated with diabetes ([place]) -     CBC with Differential/Platelet -     Comprehensive metabolic panel -     Hemoglobin A1c -     lisinopril-hydrochlorothiazide (ZESTORETIC) 10-12.5 MG tablet; Take 1 tablet by mouth daily. -     Accu-Chek Softclix Lancets lancets; Test blood sugar once daily. Dx: E11.9 -     glucose blood (ACCU-CHEK AVIVA PLUS) test strip; Test blood sugar once daily. Dx: E11.9  Acute gouty arthritis -     colchicine 0.6 MG tablet; Take 1.2 mg now and 0.6 mg one hour later. Take 0.6 mg daily after that x 5 days. -     Uric Acid  Dyslipidemia associated with type 2 diabetes mellitus (HCC) -     Lipid panel -     rosuvastatin (CRESTOR) 40 MG tablet; Take 1 tablet (40 mg total) by mouth daily.  Essential hypertension  Pure hypercholesterolemia  Diabetes mellitus type 2 without retinopathy ([place]) -     metFORMIN (GLUCOPHAGE) 500 MG tablet; Take 1 tablet (500 mg total) by mouth 2 (two) times daily with a meal.  Class 2 severe obesity due to excess calories with serious comorbidity and body mass index (BMI) of  38.0 to 38.9 in adult [facility])  History of sarcoidosis  History of sleep apnea  History of gastroesophageal reflux (GERD)    Patient Instructions       If you have lab work done today you will be contacted with your lab results within the next 2 weeks.  If you have not heard from [place] then please contact us. The fastest way to get your results is to  register for My Chart.   IF you received an x-ray today, you will receive an invoice from [facility]. Please contact [facility] at [phone] with questions or concerns regarding your invoice.   IF you received labwork today, you will receive an invoice from [facility]. Please contact [facility] at [phone] with questions or concerns regarding your invoice.   Our billing staff will not be able to assist you with questions regarding bills from these companies.  You will be contacted with the lab results as soon as they are available. The fastest way to get your results is to activate your My Chart account. Instructions are located on the last page of this paperwork. If you have not heard from [place] regarding the results in 2 weeks, please contact this office.     Gout  Gout is painful swelling of your joints. Gout is a type of arthritis. It is caused by having too much uric acid in your body. Uric acid is a chemical that is made when your body breaks down substances called purines. If your body has too much uric acid, sharp crystals can form and build up in your joints. This causes pain and swelling. Gout attacks can happen quickly and be very painful (acute gout). Over time, the attacks can affect more joints and happen more often (chronic gout). What are the causes?  Too much uric acid in your blood. This can happen because: ? Your kidneys do not remove enough uric acid from your blood. ? Your body makes too much uric acid. ? You eat too many foods that are high in purines. These foods include organ meats, some seafood, and beer.  Trauma or stress. What increases the risk?  Having a family history of gout.  Being female and middle-aged.  Being female and having gone through menopause.  Being very overweight (obese).  Drinking alcohol, especially beer.  Not having enough water in the body (being dehydrated).  Losing weight too quickly.  Having an organ  transplant.  Having lead poisoning.  Taking certain medicines.  Having kidney disease.  Having a skin condition called psoriasis. What are the signs or symptoms? An attack of acute gout usually happens in just one joint. The most common place is the big toe. Attacks often start at night. Other joints that may be affected include joints of the feet, ankle, knee, fingers, wrist, or elbow. Symptoms of an attack may include:  Very bad pain.  Warmth.  Swelling.  Stiffness.  Shiny, red, or purple skin.  Tenderness. The affected joint may be very painful to touch.  Chills and fever. Chronic gout may cause symptoms more often. More joints may be involved. You may also have white or yellow lumps (tophi) on your hands or feet or in other areas near your joints. How is this treated?  Treatment for this condition has two phases: treating an acute attack and preventing future attacks.  Acute gout treatment may include: ? NSAIDs. ? Steroids. These are taken by mouth or injected into a joint. ? Colchicine. This medicine relieves pain  and swelling. It can be given by mouth or through an IV tube.  Preventive treatment may include: ? Taking small doses of NSAIDs or colchicine daily. ? Using a medicine that reduces uric acid levels in your blood. ? Making changes to your diet. You may need to see a food expert (dietitian) about what to eat and drink to prevent gout. Follow these instructions at home: During a gout attack   If told, put ice on the painful area: ? Put ice in a plastic bag. ? Place a towel between your skin and the bag. ? Leave the ice on for 20 minutes, 2-3 times a day.  Raise (elevate) the painful joint above the level of your heart as often as you can.  Rest the joint as much as possible. If the joint is in your leg, you may be given crutches.  Follow instructions from your doctor about what you cannot eat or drink. Avoiding future gout attacks  Eat a low-purine  diet. Avoid foods and drinks such as: ? Liver. ? Kidney. ? Anchovies. ? Asparagus. ? Herring. ? Mushrooms. ? Mussels. ? Beer.  Stay at a healthy weight. If you want to lose weight, talk with your doctor. Do not lose weight too fast.  Start or continue an exercise plan as told by your doctor. Eating and drinking  Drink enough fluids to keep your pee (urine) pale yellow.  If you drink alcohol: ? Limit how much you use to:  0-1 drink a day for women.  0-2 drinks a day for men. ? Be aware of how much alcohol is in your drink. In the [place] one drink equals one 12 oz bottle of beer (355 mL), one 5 oz glass of wine (148 mL), or one 1 oz glass of hard liquor (44 mL). General instructions  Take over-the-counter and prescription medicines only as told by your doctor.  Do not drive or use heavy machinery while taking prescription pain medicine.  Return to your normal activities as told by your doctor. Ask your doctor what activities are safe for you.  Keep all follow-up visits as told by your doctor. This is important. Contact a doctor if:  You have another gout attack.  You still have symptoms of a gout attack after 10 days of treatment.  You have problems (side effects) because of your medicines.  You have chills or a fever.  You have burning pain when you pee (urinate).  You have pain in your lower back or belly. Get help right away if:  You have very bad pain.  Your pain cannot be controlled.  You cannot pee. Summary  Gout is painful swelling of the joints.  The most common site of pain is the big toe, but it can affect other joints.  Medicines and avoiding some foods can help to prevent and treat gout attacks. This information is not intended to replace advice given to you by your health care provider. Make sure you discuss any questions you have with your health care provider. Document Revised: 06/19/2018 Document Reviewed: 06/19/2018 Elsevier Patient  Education  [place] Maintenance After Age 74 After age 86, you are at a higher risk for certain long-term diseases and infections as well as injuries from falls. Falls are a major cause of broken bones and head injuries in people who are older than age 50. Getting regular preventive care can help to keep you healthy and well. Preventive care includes getting regular testing and making  lifestyle changes as recommended by your health care provider. Talk with your health care provider about:  Which screenings and tests you should have. A screening is a test that checks for a disease when you have no symptoms.  A diet and exercise plan that is right for you. What should I know about screenings and tests to prevent falls? Screening and testing are the best ways to find a health problem early. Early diagnosis and treatment give you the best chance of managing medical conditions that are common after age 62. Certain conditions and lifestyle choices may make you more likely to have a fall. Your health care provider may recommend:  Regular vision checks. Poor vision and conditions such as cataracts can make you more likely to have a fall. If you wear glasses, make sure to get your prescription updated if your vision changes.  Medicine review. Work with your health care provider to regularly review all of the medicines you are taking, including over-the-counter medicines. Ask your health care provider about any side effects that may make you more likely to have a fall. Tell your health care provider if any medicines that you take make you feel dizzy or sleepy.  Osteoporosis screening. Osteoporosis is a condition that causes the bones to get weaker. This can make the bones weak and cause them to break more easily.  Blood pressure screening. Blood pressure changes and medicines to control blood pressure can make you feel dizzy.  Strength and balance checks. Your health care provider may  recommend certain tests to check your strength and balance while standing, walking, or changing positions.  Foot health exam. Foot pain and numbness, as well as not wearing proper footwear, can make you more likely to have a fall.  Depression screening. You may be more likely to have a fall if you have a fear of falling, feel emotionally low, or feel unable to do activities that you used to do.  Alcohol use screening. Using too much alcohol can affect your balance and may make you more likely to have a fall. What actions can I take to lower my risk of falls? General instructions  Talk with your health care provider about your risks for falling. Tell your health care provider if: ? You fall. Be sure to tell your health care provider about all falls, even ones that seem minor. ? You feel dizzy, sleepy, or off-balance.  Take over-the-counter and prescription medicines only as told by your health care provider. These include any supplements.  Eat a healthy diet and maintain a healthy weight. A healthy diet includes low-fat dairy products, low-fat (lean) meats, and fiber from whole grains, beans, and lots of fruits and vegetables. Home safety  Remove any tripping hazards, such as rugs, cords, and clutter.  Install safety equipment such as grab bars in bathrooms and safety rails on stairs.  Keep rooms and walkways well-lit. Activity   Follow a regular exercise program to stay fit. This will help you maintain your balance. Ask your health care provider what types of exercise are appropriate for you.  If you need a cane or walker, use it as recommended by your health care provider.  Wear supportive shoes that have nonskid soles. Lifestyle  Do not drink alcohol if your health care provider tells you not to drink.  If you drink alcohol, limit how much you have: ? 0-1 drink a day for women. ? 0-2 drinks a day for men.  Be aware of how much alcohol  is in your drink. In the [place] one drink  equals one typical bottle of beer (12 oz), one-half glass of wine (5 oz), or one shot of hard liquor (1 oz).  Do not use any products that contain nicotine or tobacco, such as cigarettes and e-cigarettes. If you need help quitting, ask your health care provider. Summary  Having a healthy lifestyle and getting preventive care can help to protect your health and wellness after age 47.  Screening and testing are the best way to find a health problem early and help you avoid having a fall. Early diagnosis and treatment give you the best chance for managing medical conditions that are more common for people who are older than age 57.  Falls are a major cause of broken bones and head injuries in people who are older than age 67. Take precautions to prevent a fall at home.  Work with your health care provider to learn what changes you can make to improve your health and wellness and to prevent falls. This information is not intended to replace advice given to you by your health care provider. Make sure you discuss any questions you have with your health care provider. Document Revised: 03/20/2019 Document Reviewed: 10/10/2017 Elsevier Patient Education  2020 Elsevier Inc.      Agustina Caroli, MD [facility] [place] [facility]

## 2020-04-26 NOTE — Patient Instructions (Addendum)
   If you have lab work done today you will be contacted with your lab results within the next 2 weeks.  If you have not heard from us then please contact us. The fastest way to get your results is to register for My Chart.   IF you received an x-ray today, you will receive an invoice from [facility]. Please contact [facility] at [phone] with questions or concerns regarding your invoice.   IF you received labwork today, you will receive an invoice from [facility]. Please contact [facility] at [phone] with questions or concerns regarding your invoice.   Our billing staff will not be able to assist you with questions regarding bills from these companies.  You will be contacted with the lab results as soon as they are available. The fastest way to get your results is to activate your My Chart account. Instructions are located on the last page of this paperwork. If you have not heard from us regarding the results in 2 weeks, please contact this office.     Gout  Gout is painful swelling of your joints. Gout is a type of arthritis. It is caused by having too much uric acid in your body. Uric acid is a chemical that is made when your body breaks down substances called purines. If your body has too much uric acid, sharp crystals can form and build up in your joints. This causes pain and swelling. Gout attacks can happen quickly and be very painful (acute gout). Over time, the attacks can affect more joints and happen more often (chronic gout). What are the causes?  Too much uric acid in your blood. This can happen because: ? Your kidneys do not remove enough uric acid from your blood. ? Your body makes too much uric acid. ? You eat too many foods that are high in purines. These foods include organ meats, some seafood, and beer.  Trauma or stress. What increases the risk?  Having a family history of gout.  Being female and middle-aged.  Being female and having gone  through menopause.  Being very overweight (obese).  Drinking alcohol, especially beer.  Not having enough water in the body (being dehydrated).  Losing weight too quickly.  Having an organ transplant.  Having lead poisoning.  Taking certain medicines.  Having kidney disease.  Having a skin condition called psoriasis. What are the signs or symptoms? An attack of acute gout usually happens in just one joint. The most common place is the big toe. Attacks often start at night. Other joints that may be affected include joints of the feet, ankle, knee, fingers, wrist, or elbow. Symptoms of an attack may include:  Very bad pain.  Warmth.  Swelling.  Stiffness.  Shiny, red, or purple skin.  Tenderness. The affected joint may be very painful to touch.  Chills and fever. Chronic gout may cause symptoms more often. More joints may be involved. You may also have white or yellow lumps (tophi) on your hands or feet or in other areas near your joints. How is this treated?  Treatment for this condition has two phases: treating an acute attack and preventing future attacks.  Acute gout treatment may include: ? NSAIDs. ? Steroids. These are taken by mouth or injected into a joint. ? Colchicine. This medicine relieves pain and swelling. It can be given by mouth or through an IV tube.  Preventive treatment may include: ? Taking small doses of NSAIDs or colchicine daily. ? Using a   medicine that reduces uric acid levels in your blood. ? Making changes to your diet. You may need to see a food expert (dietitian) about what to eat and drink to prevent gout. Follow these instructions at home: During a gout attack   If told, put ice on the painful area: ? Put ice in a plastic bag. ? Place a towel between your skin and the bag. ? Leave the ice on for 20 minutes, 2-3 times a day.  Raise (elevate) the painful joint above the level of your heart as often as you can.  Rest the joint as  much as possible. If the joint is in your leg, you may be given crutches.  Follow instructions from your doctor about what you cannot eat or drink. Avoiding future gout attacks  Eat a low-purine diet. Avoid foods and drinks such as: ? Liver. ? Kidney. ? Anchovies. ? Asparagus. ? Herring. ? Mushrooms. ? Mussels. ? Beer.  Stay at a healthy weight. If you want to lose weight, talk with your doctor. Do not lose weight too fast.  Start or continue an exercise plan as told by your doctor. Eating and drinking  Drink enough fluids to keep your pee (urine) pale yellow.  If you drink alcohol: ? Limit how much you use to:  0-1 drink a day for women.  0-2 drinks a day for men. ? Be aware of how much alcohol is in your drink. In the [place] one drink equals one 12 oz bottle of beer (355 mL), one 5 oz glass of wine (148 mL), or one 1 oz glass of hard liquor (44 mL). General instructions  Take over-the-counter and prescription medicines only as told by your doctor.  Do not drive or use heavy machinery while taking prescription pain medicine.  Return to your normal activities as told by your doctor. Ask your doctor what activities are safe for you.  Keep all follow-up visits as told by your doctor. This is important. Contact a doctor if:  You have another gout attack.  You still have symptoms of a gout attack after 10 days of treatment.  You have problems (side effects) because of your medicines.  You have chills or a fever.  You have burning pain when you pee (urinate).  You have pain in your lower back or belly. Get help right away if:  You have very bad pain.  Your pain cannot be controlled.  You cannot pee. Summary  Gout is painful swelling of the joints.  The most common site of pain is the big toe, but it can affect other joints.  Medicines and avoiding some foods can help to prevent and treat gout attacks. This information is not intended to replace advice given  to you by your health care provider. Make sure you discuss any questions you have with your health care provider. Document Revised: 06/19/2018 Document Reviewed: 06/19/2018 Elsevier Patient Education  [place] Maintenance After Age 70 After age 26, you are at a higher risk for certain long-term diseases and infections as well as injuries from falls. Falls are a major cause of broken bones and head injuries in people who are older than age 96. Getting regular preventive care can help to keep you healthy and well. Preventive care includes getting regular testing and making lifestyle changes as recommended by your health care provider. Talk with your health care provider about:  Which screenings and tests you should have. A screening is a test that  checks for a disease when you have no symptoms.  A diet and exercise plan that is right for you. What should I know about screenings and tests to prevent falls? Screening and testing are the best ways to find a health problem early. Early diagnosis and treatment give you the best chance of managing medical conditions that are common after age 72. Certain conditions and lifestyle choices may make you more likely to have a fall. Your health care provider may recommend:  Regular vision checks. Poor vision and conditions such as cataracts can make you more likely to have a fall. If you wear glasses, make sure to get your prescription updated if your vision changes.  Medicine review. Work with your health care provider to regularly review all of the medicines you are taking, including over-the-counter medicines. Ask your health care provider about any side effects that may make you more likely to have a fall. Tell your health care provider if any medicines that you take make you feel dizzy or sleepy.  Osteoporosis screening. Osteoporosis is a condition that causes the bones to get weaker. This can make the bones weak and cause them to break  more easily.  Blood pressure screening. Blood pressure changes and medicines to control blood pressure can make you feel dizzy.  Strength and balance checks. Your health care provider may recommend certain tests to check your strength and balance while standing, walking, or changing positions.  Foot health exam. Foot pain and numbness, as well as not wearing proper footwear, can make you more likely to have a fall.  Depression screening. You may be more likely to have a fall if you have a fear of falling, feel emotionally low, or feel unable to do activities that you used to do.  Alcohol use screening. Using too much alcohol can affect your balance and may make you more likely to have a fall. What actions can I take to lower my risk of falls? General instructions  Talk with your health care provider about your risks for falling. Tell your health care provider if: ? You fall. Be sure to tell your health care provider about all falls, even ones that seem minor. ? You feel dizzy, sleepy, or off-balance.  Take over-the-counter and prescription medicines only as told by your health care provider. These include any supplements.  Eat a healthy diet and maintain a healthy weight. A healthy diet includes low-fat dairy products, low-fat (lean) meats, and fiber from whole grains, beans, and lots of fruits and vegetables. Home safety  Remove any tripping hazards, such as rugs, cords, and clutter.  Install safety equipment such as grab bars in bathrooms and safety rails on stairs.  Keep rooms and walkways well-lit. Activity   Follow a regular exercise program to stay fit. This will help you maintain your balance. Ask your health care provider what types of exercise are appropriate for you.  If you need a cane or walker, use it as recommended by your health care provider.  Wear supportive shoes that have nonskid soles. Lifestyle  Do not drink alcohol if your health care provider tells you not  to drink.  If you drink alcohol, limit how much you have: ? 0-1 drink a day for women. ? 0-2 drinks a day for men.  Be aware of how much alcohol is in your drink. In the [place] one drink equals one typical bottle of beer (12 oz), one-half glass of wine (5 oz), or one shot of hard liquor (  1 oz).  Do not use any products that contain nicotine or tobacco, such as cigarettes and e-cigarettes. If you need help quitting, ask your health care provider. Summary  Having a healthy lifestyle and getting preventive care can help to protect your health and wellness after age 44.  Screening and testing are the best way to find a health problem early and help you avoid having a fall. Early diagnosis and treatment give you the best chance for managing medical conditions that are more common for people who are older than age 73.  Falls are a major cause of broken bones and head injuries in people who are older than age 69. Take precautions to prevent a fall at home.  Work with your health care provider to learn what changes you can make to improve your health and wellness and to prevent falls. This information is not intended to replace advice given to you by your health care provider. Make sure you discuss any questions you have with your health care provider. Document Revised: 03/20/2019 Document Reviewed: 10/10/2017 Elsevier Patient Education  2020 [employer].

## 2020-04-27 ENCOUNTER — Other Ambulatory Visit: Payer: Self-pay | Admitting: Emergency Medicine

## 2020-04-27 ENCOUNTER — Encounter: Payer: Self-pay | Admitting: Emergency Medicine

## 2020-04-27 DIAGNOSIS — E790 Hyperuricemia without signs of inflammatory arthritis and tophaceous disease: Secondary | ICD-10-CM | POA: Insufficient documentation

## 2020-04-27 LAB — COMPREHENSIVE METABOLIC PANEL
ALT: 8 IU/L (ref 0–32)
AST: 13 IU/L (ref 0–40)
Albumin/Globulin Ratio: 1.3 (ref 1.2–2.2)
Albumin: 4.3 g/dL (ref 3.8–4.8)
Alkaline Phosphatase: 108 IU/L (ref 48–121)
BUN/Creatinine Ratio: 22 (ref 12–28)
BUN: 25 mg/dL (ref 8–27)
Bilirubin Total: <0.2 mg/dL (ref 0.0–1.2)
CO2: 23 mmol/L (ref 20–29)
Calcium: 10.3 mg/dL (ref 8.7–10.3)
Chloride: 103 mmol/L (ref 96–106)
Creatinine, Ser: 1.15 mg/dL — ABNORMAL HIGH (ref 0.57–1.00)
GFR calc Af Amer: 57 mL/min/{1.73_m2} — ABNORMAL LOW (ref >59)
GFR calc non Af Amer: 50 mL/min/{1.73_m2} — ABNORMAL LOW (ref >59)
Globulin, Total: 3.4 g/dL (ref 1.5–4.5)
Glucose: 94 mg/dL (ref 65–99)
Potassium: 4.9 mmol/L (ref 3.5–5.2)
Sodium: 144 mmol/L (ref 134–144)
Total Protein: 7.7 g/dL (ref 6.0–8.5)

## 2020-04-27 LAB — CBC WITH DIFFERENTIAL/PLATELET
Basophils Absolute: 0.1 10*3/uL (ref 0.0–0.2)
Basos: 1 %
EOS (ABSOLUTE): 0.2 10*3/uL (ref 0.0–0.4)
Eos: 3 %
Hematocrit: 35.3 % (ref 34.0–46.6)
Hemoglobin: 10.5 g/dL — ABNORMAL LOW (ref 11.1–15.9)
Immature Grans (Abs): 0 10*3/uL (ref 0.0–0.1)
Immature Granulocytes: 0 %
Lymphocytes Absolute: 2.5 10*3/uL (ref 0.7–3.1)
Lymphs: 33 %
MCH: 21.2 pg — ABNORMAL LOW (ref 26.6–33.0)
MCHC: 29.7 g/dL — ABNORMAL LOW (ref 31.5–35.7)
MCV: 71 fL — ABNORMAL LOW (ref 79–97)
Monocytes Absolute: 0.3 10*3/uL (ref 0.1–0.9)
Monocytes: 4 %
Neutrophils Absolute: 4.3 10*3/uL (ref 1.4–7.0)
Neutrophils: 59 %
Platelets: 348 10*3/uL (ref 150–450)
RBC: 4.96 x10E6/uL (ref 3.77–5.28)
RDW: 16.2 % — ABNORMAL HIGH (ref 11.7–15.4)
WBC: 7.4 10*3/uL (ref 3.4–10.8)

## 2020-04-27 LAB — LIPID PANEL
Chol/HDL Ratio: 4.7 ratio — ABNORMAL HIGH (ref 0.0–4.4)
Cholesterol, Total: 229 mg/dL — ABNORMAL HIGH (ref 100–199)
HDL: 49 mg/dL (ref >39)
LDL Chol Calc (NIH): 166 mg/dL — ABNORMAL HIGH (ref 0–99)
Triglycerides: 78 mg/dL (ref 0–149)
VLDL Cholesterol Cal: 14 mg/dL (ref 5–40)

## 2020-04-27 LAB — HEMOGLOBIN A1C
Est. average glucose Bld gHb Est-mCnc: 131 mg/dL
Hgb A1c MFr Bld: 6.2 % — ABNORMAL HIGH (ref 4.8–5.6)

## 2020-04-27 LAB — URIC ACID: Uric Acid: 9.1 mg/dL — ABNORMAL HIGH (ref 3.0–7.2)

## 2020-04-27 MED ORDER — ALLOPURINOL 100 MG PO TABS: 50.0000 mg | ORAL_TABLET | Freq: Every day | ORAL | 1 refills | Status: DC

## 2020-04-27 NOTE — Addendum Note (Signed)
Addended by: Georg Ruddle A on: 04/27/2020 12:20 PM   Modules accepted: Orders

## 2020-05-04 ENCOUNTER — Telehealth: Payer: Self-pay | Admitting: *Deleted

## 2020-05-04 NOTE — Telephone Encounter (Signed)
Faxed new Rx request from [facility] for [facility]. Confirmation page 5:45 pm.

## 2020-05-13 ENCOUNTER — Telehealth: Payer: Self-pay

## 2020-05-13 NOTE — Telephone Encounter (Signed)
Spoke with [facility] after receiving fax request for true metrix meter kit rx request. They stated will put fax on hold for about 2 months , accu check aviva will no longer be available .patient will finish out supplies for accu chek aviva until then.  New rx will be needed for either true metrix air meter kit w/ device or accu guide brand. Pt is aware.

## 2020-05-14 DIAGNOSIS — G4733 Obstructive sleep apnea (adult) (pediatric): Secondary | ICD-10-CM | POA: Diagnosis not present

## 2020-05-18 ENCOUNTER — Other Ambulatory Visit: Payer: Self-pay

## 2020-05-18 ENCOUNTER — Ambulatory Visit (INDEPENDENT_AMBULATORY_CARE_PROVIDER_SITE_OTHER): Payer: Medicare HMO | Admitting: Ophthalmology

## 2020-05-18 ENCOUNTER — Encounter (INDEPENDENT_AMBULATORY_CARE_PROVIDER_SITE_OTHER): Payer: Self-pay | Admitting: Ophthalmology

## 2020-05-18 DIAGNOSIS — H35073 Retinal telangiectasis, bilateral: Secondary | ICD-10-CM | POA: Diagnosis not present

## 2020-05-18 DIAGNOSIS — H35352 Cystoid macular degeneration, left eye: Secondary | ICD-10-CM | POA: Diagnosis not present

## 2020-05-18 DIAGNOSIS — H35072 Retinal telangiectasis, left eye: Secondary | ICD-10-CM | POA: Diagnosis not present

## 2020-05-18 DIAGNOSIS — H35071 Retinal telangiectasis, right eye: Secondary | ICD-10-CM | POA: Diagnosis not present

## 2020-05-18 DIAGNOSIS — G4733 Obstructive sleep apnea (adult) (pediatric): Secondary | ICD-10-CM | POA: Diagnosis not present

## 2020-05-18 NOTE — Progress Notes (Signed)
05/18/2020     CHIEF COMPLAINT Patient presents for Retina Follow Up   HISTORY OF PRESENT ILLNESS: Eileen Jefferson is a 69 y.o. female who presents to the clinic today for:   HPI    Retina Follow Up    Patient presents with  Other.  In both eyes.  This started 5 months ago.  Severity is mild.  Duration of 5 months.  Since onset it is stable.          Comments    5 Month F/U OU  Pt reports stable VA OU. Pt reports using CPAP every night. No new symptoms reported OU.       Last edited by Rockie Neighbours, [place] on 05/18/2020  8:14 AM. (History)      Referring physician: Horald Pollen, MD [place],  [place] [id number]  HISTORICAL INFORMATION:   Selected notes from the MEDICAL RECORD NUMBER    Lab Results  Component Value Date   HGBA1C 6.2 (H) 04/26/2020     CURRENT MEDICATIONS: Current Outpatient Medications (Ophthalmic Drugs)  Medication Sig  . brimonidine-timolol (COMBIGAN) 0.2-0.5 % ophthalmic solution Place 1 drop into the left eye 2 (two) times daily.    . brinzolamide (AZOPT) 1 % ophthalmic suspension Place 1 drop into the left eye 2 (two) times daily.     No current facility-administered medications for this visit. (Ophthalmic Drugs)   Current Outpatient Medications (Other)  Medication Sig  . Accu-Chek Softclix Lancets lancets Test blood sugar once daily. Dx: E11.9  . Alcohol Swabs PADS Test blood sugar once daily. Dx: E11.9  . allopurinol (ZYLOPRIM) 100 MG tablet Take 0.5 tablets (50 mg total) by mouth daily.  Marland Kitchen aspirin EC 81 MG tablet Take 81 mg by mouth daily.  . Blood Glucose Calibration (ACCU-CHEK AVIVA) SOLN Test blood sugar once daily. Dx: E11.9  . Blood Glucose Monitoring Suppl (ACCU-CHEK AVIVA PLUS) w/Device KIT Test blood sugar once daily. Dx: E11.9  . colchicine 0.6 MG tablet Take 1.2 mg now and 0.6 mg one hour later. Take 0.6 mg daily after that x 5 days.  . Continuous Blood Gluc Receiver (FREESTYLE LIBRE READER) DEVI 1  application by Does not apply route as needed.  . Continuous Blood Gluc Sensor ([place]) MISC 1 application by Does not apply route as needed.  . famotidine (PEPCID) 40 MG tablet Take 1 tablet (40 mg total) by mouth 2 (two) times daily as needed for heartburn or indigestion.  Marland Kitchen glucose blood (ACCU-CHEK AVIVA PLUS) test strip Test blood sugar once daily. Dx: E11.9  . lisinopril-hydrochlorothiazide (ZESTORETIC) 10-12.5 MG tablet Take 1 tablet by mouth daily.  . metFORMIN (GLUCOPHAGE) 500 MG tablet Take 1 tablet (500 mg total) by mouth 2 (two) times daily with a meal.  . omeprazole (PRILOSEC) 40 MG capsule Take 1 capsule (40 mg total) by mouth daily. 30-60 minutes before breakfast  . rosuvastatin (CRESTOR) 40 MG tablet Take 1 tablet (40 mg total) by mouth daily.   No current facility-administered medications for this visit. (Other)      REVIEW OF SYSTEMS:    ALLERGIES Allergies  Allergen Reactions  . Contrast Media [Iodinated Diagnostic Agents]   . Iohexol Other (See Comments)    " made me feel like I was burning inside"    PAST MEDICAL HISTORY Past Medical History:  Diagnosis Date  . DDD (degenerative disc disease), cervical 07/11/2017   C5-6 and C6-7 cervical spondylosis and degenerative disc disease on 2005  Xray  . Diverticulosis   . Glaucoma 04/06/2014  . HTN (hypertension)   . Hyperlipidemia   . 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 Iron deficiency anemia   . Pulmonary sarcoidosis ([place])   . Sleep apnea   . Thyroid disease   . 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 APPENDECTOMY    . c-section     x 2  . CATARACT EXTRACTION     bilateral  . SCLERAL BUCKLE     right  . TONSILLECTOMY    . TOTAL ABDOMINAL HYSTERECTOMY    . TUBAL LIGATION    . VITRECTOMY     bilateral    FAMILY HISTORY Family History  Problem Relation Age of  Onset  . Colon polyps Mother        brothers x2  . Clotting disorder Mother        brother x 2, MGM  . Heart disease Brother        mother, MGM  . Diabetes Brother        x 2, MGM  . Stroke Maternal Grandmother   . Colon cancer Neg Hx   . Breast cancer Neg Hx     SOCIAL HISTORY Social History   Tobacco Use  . Smoking status: Former Smoker    Packs/day: 0.20    Years: 1.00    Pack years: 0.20    Types: Cigarettes    Quit date: 12/11/1968    Years since quitting: 51.4  . Smokeless tobacco: Never Used  Substance Use Topics  . Alcohol use: No  . Drug use: No         OPHTHALMIC EXAM: Base Eye Exam    Visual Acuity (ETDRS)      Right Left   Dist [place] 20/400 20/60   Dist ph [place] 20/200 20/50 -1  Pt forgot glasses today       Tonometry (Tonopen, 8:18 AM)      Right Left   Pressure 15 17       Pupils      Dark Light Shape React APD   Right 4 3 Irregular Brisk None   Left 4 3 Round Brisk None       Visual Fields (Counting fingers)      Left Right    Full    Restrictions  Total superior temporal, inferior temporal, superior nasal, inferior nasal deficiencies       Extraocular Movement      Right Left    Full Full       Neuro/Psych    Oriented x3: Yes   Mood/Affect: Normal       Dilation    Both eyes: 1.0% Mydriacyl, 2.5% Phenylephrine @ 8:18 AM          IMAGING AND PROCEDURES  Imaging and Procedures for 05/18/20           ASSESSMENT/PLAN:  No problem-specific Assessment & Plan notes found for this encounter.      ICD-10-CM   1. 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 Retinal telangiectasia of right eye  H35.071 OCT, Retina - OU - Both Eyes  3. Cystoid macular edema of left eye  H35.352     1.  2.  3.  Ophthalmic Meds Ordered this visit:  No orders of the defined types were placed in this  encounter.      No follow-ups on file.  There are no Patient Instructions on file for this  visit.   Explained the diagnoses, plan, and follow up with the patient and they expressed understanding.  Patient expressed understanding of the importance of proper follow up care.   Clent Demark Teodor Prater M.D. Diseases & Surgery of the Retina and Vitreous [facility]ina [facility] [place] 05/18/20     Abbreviations: M myopia (nearsighted); A astigmatism; H hyperopia (farsighted); P presbyopia; Mrx spectacle prescription;  CTL contact lenses; OD right eye; OS left eye; OU both eyes  XT exotropia; ET esotropia; PEK punctate epithelial keratitis; PEE punctate epithelial erosions; DES dry eye syndrome; MGD meibomian gland dysfunction; ATs artificial tears; PFAT's preservative free artificial tears; [place] nuclear sclerotic cataract; PSC posterior subcapsular cataract; ERM epi-retinal membrane; PVD posterior vitreous detachment; RD retinal detachment; DM diabetes mellitus; DR diabetic retinopathy; NPDR non-proliferative diabetic retinopathy; PDR proliferative diabetic retinopathy; CSME clinically significant macular edema; DME diabetic macular edema; dbh dot blot hemorrhages; CWS cotton wool spot; POAG primary open angle glaucoma; C/D cup-to-disc ratio; HVF humphrey visual field; GVF goldmann visual field; OCT optical coherence tomography; IOP intraocular pressure; BRVO Branch retinal vein occlusion; CRVO central retinal vein occlusion; CRAO central retinal artery occlusion; BRAO branch retinal artery occlusion; RT retinal tear; SB scleral buckle; PPV pars plana vitrectomy; VH Vitreous hemorrhage; PRP panretinal laser photocoagulation; IVK intravitreal kenalog; VMT vitreomacular traction; MH Macular hole;  NVD neovascularization of the disc; NVE neovascularization elsewhere; AREDS age related eye disease study; ARMD age related macular degeneration; POAG primary open angle glaucoma; EBMD epithelial/anterior basement membrane dystrophy; ACIOL anterior chamber intraocular lens; IOL intraocular lens; PCIOL posterior chamber  intraocular lens; Phaco/IOL phacoemulsification with intraocular lens placement; [place] photorefractive keratectomy; LASIK laser assisted in situ keratomileusis; HTN hypertension; DM diabetes mellitus; COPD chronic obstructive pulmonary disease

## 2020-05-18 NOTE — Assessment & Plan Note (Signed)
Macular telangiectasis (MAC-TEL), or parafoveal telangiectasis is a condition of "unknown" cause.  Findings in or near the macula (center of vision) consist of microaneurysms (leaking small capillaries), often with leakage of fluid which in the active phase can impact fine discriminatory vision, and in some cases trigger profound scarring in the macula, with severe permanent vision loss.  Standard treatment is observation and periodic examinations to monitor for treatable complications.   The cause  of this condition is "unknown".  However, the practice of Dr. Mackenzie Groom has discovered an association with sleep apnea with its nightly periods of low oxygen in the blood stream (hypoxia), retained carbon dioxide (hypercapnia), associated with transient nocturnal hypertensive episodes.   More recently, some patients also been found to have advanced lung disease, whether asthma or COPD, with similar findings.  Dr. Alice Vitelli has been evaluating the association of sleep apnea, nightly hypoxia, and Macular telangiectasis for over 18 years.  Most patients are found to be noncompliant with sleep apnea therapy or testing in the past.  Resumption of CPAP or similar therapy is strongly recommended if ordered in the past.  Upon review of risk factors or findings positive for sleep apnea, more formal, extensive sleep laboratory or home testing, may be recommended.  Numerous patients, proven to have MAC-TEL, have improved or resolved their ey condition promptly, within weeks, of the use of nighttime oxygen supplementation or continuous positive airway pressure (CPAP). ° °Continue CPAP, OU macular findings and vision has stabilized and improved now that CPAP use resumed as of October 2020 ° °

## 2020-05-18 NOTE — Assessment & Plan Note (Signed)
Macular telangiectasis (MAC-TEL), or parafoveal telangiectasis is a condition of "unknown" cause.  Findings in or near the macula (center of vision) consist of microaneurysms (leaking small capillaries), often with leakage of fluid which in the active phase can impact fine discriminatory vision, and in some cases trigger profound scarring in the macula, with severe permanent vision loss.  Standard treatment is observation and periodic examinations to monitor for treatable complications.   The cause  of this condition is "unknown".  However, the practice of Dr. Zadie Rhine has discovered an association with sleep apnea with its nightly periods of low oxygen in the blood stream (hypoxia), retained carbon dioxide (hypercapnia), associated with transient nocturnal hypertensive episodes.   More recently, some patients also been found to have advanced lung disease, whether asthma or COPD, with similar findings.  Dr. Zadie Rhine has been evaluating the association of sleep apnea, nightly hypoxia, and Macular telangiectasis for over 18 years.  Most patients are found to be noncompliant with sleep apnea therapy or testing in the past.  Resumption of CPAP or similar therapy is strongly recommended if ordered in the past.  Upon review of risk factors or findings positive for sleep apnea, more formal, extensive sleep laboratory or home testing, may be recommended.  Numerous patients, proven to have MAC-TEL, have improved or resolved their ey condition promptly, within weeks, of the use of nighttime oxygen supplementation or continuous positive airway pressure (CPAP).  Continue CPAP, OU macular findings and vision has stabilized and improved now that CPAP use resumed as of October 2020

## 2020-05-27 ENCOUNTER — Other Ambulatory Visit: Payer: Self-pay

## 2020-05-27 DIAGNOSIS — K219 Gastro-esophageal reflux disease without esophagitis: Secondary | ICD-10-CM

## 2020-05-27 MED ORDER — OMEPRAZOLE 40 MG PO CPDR: 40.0000 mg | DELAYED_RELEASE_CAPSULE | Freq: Every day | ORAL | 1 refills | Status: DC

## 2020-05-28 ENCOUNTER — Other Ambulatory Visit: Payer: Self-pay

## 2020-05-28 DIAGNOSIS — E790 Hyperuricemia without signs of inflammatory arthritis and tophaceous disease: Secondary | ICD-10-CM

## 2020-05-28 MED ORDER — ALLOPURINOL 100 MG PO TABS: 50.0000 mg | ORAL_TABLET | Freq: Every day | ORAL | 0 refills | Status: DC

## 2020-06-03 ENCOUNTER — Telehealth: Payer: Self-pay | Admitting: *Deleted

## 2020-06-03 NOTE — Telephone Encounter (Signed)
Spoke to patient about which glucose monitor she uses, [employer]'s request is for True Metrix. She was unable to tell me the brand because she was not at home. Also, [employer] is requesting a refill on Allopurinol and patient wants to wait until she get the correct name of glucose monitor before refills.

## 2020-06-10 ENCOUNTER — Telehealth: Payer: Self-pay | Admitting: *Deleted

## 2020-06-10 NOTE — Telephone Encounter (Signed)
Left message in mobile voice to call back with the name of glucose monitor. [employer] is requesting refills on supplies.

## 2020-06-13 DIAGNOSIS — G4733 Obstructive sleep apnea (adult) (pediatric): Secondary | ICD-10-CM | POA: Diagnosis not present

## 2020-07-10 ENCOUNTER — Other Ambulatory Visit: Payer: Self-pay | Admitting: Emergency Medicine

## 2020-07-10 DIAGNOSIS — K219 Gastro-esophageal reflux disease without esophagitis: Secondary | ICD-10-CM

## 2020-07-14 DIAGNOSIS — G4733 Obstructive sleep apnea (adult) (pediatric): Secondary | ICD-10-CM | POA: Diagnosis not present

## 2020-08-14 DIAGNOSIS — G4733 Obstructive sleep apnea (adult) (pediatric): Secondary | ICD-10-CM | POA: Diagnosis not present

## 2020-08-24 DIAGNOSIS — G4733 Obstructive sleep apnea (adult) (pediatric): Secondary | ICD-10-CM | POA: Diagnosis not present

## 2020-09-13 DIAGNOSIS — G4733 Obstructive sleep apnea (adult) (pediatric): Secondary | ICD-10-CM | POA: Diagnosis not present

## 2020-10-14 DIAGNOSIS — G4733 Obstructive sleep apnea (adult) (pediatric): Secondary | ICD-10-CM | POA: Diagnosis not present

## 2020-10-22 ENCOUNTER — Telehealth: Payer: Self-pay | Admitting: Emergency Medicine

## 2020-10-22 NOTE — Telephone Encounter (Signed)
I have called pt and rescheduled appointment per pt request.

## 2020-10-22 NOTE — Telephone Encounter (Signed)
Patient is calling to reschedule her 10/27/20 appt. Please advise CB- [phone]

## 2020-10-23 ENCOUNTER — Other Ambulatory Visit: Payer: Self-pay | Admitting: Emergency Medicine

## 2020-10-23 DIAGNOSIS — K219 Gastro-esophageal reflux disease without esophagitis: Secondary | ICD-10-CM

## 2020-10-27 ENCOUNTER — Ambulatory Visit: Payer: Medicare HMO | Admitting: Emergency Medicine

## 2020-11-08 ENCOUNTER — Ambulatory Visit (INDEPENDENT_AMBULATORY_CARE_PROVIDER_SITE_OTHER): Payer: Medicare HMO | Admitting: Emergency Medicine

## 2020-11-08 ENCOUNTER — Other Ambulatory Visit: Payer: Self-pay

## 2020-11-08 ENCOUNTER — Encounter: Payer: Self-pay | Admitting: Emergency Medicine

## 2020-11-08 VITALS — BP 138/76 | HR 76 | Temp 98.1°F | Resp 16 | Ht 63.0 in | Wt 208.6 lb

## 2020-11-08 DIAGNOSIS — E785 Hyperlipidemia, unspecified: Secondary | ICD-10-CM | POA: Diagnosis not present

## 2020-11-08 DIAGNOSIS — Z8739 Personal history of other diseases of the musculoskeletal system and connective tissue: Secondary | ICD-10-CM

## 2020-11-08 DIAGNOSIS — E1169 Type 2 diabetes mellitus with other specified complication: Secondary | ICD-10-CM

## 2020-11-08 DIAGNOSIS — N1831 Chronic kidney disease, stage 3a: Secondary | ICD-10-CM

## 2020-11-08 DIAGNOSIS — Z6836 Body mass index (BMI) 36.0-36.9, adult: Secondary | ICD-10-CM

## 2020-11-08 DIAGNOSIS — I152 Hypertension secondary to endocrine disorders: Secondary | ICD-10-CM | POA: Diagnosis not present

## 2020-11-08 DIAGNOSIS — E1159 Type 2 diabetes mellitus with other circulatory complications: Secondary | ICD-10-CM

## 2020-11-08 LAB — POCT GLYCOSYLATED HEMOGLOBIN (HGB A1C): Hemoglobin A1C: 6.2 % — AB (ref 4.0–5.6)

## 2020-11-08 LAB — GLUCOSE, POCT (MANUAL RESULT ENTRY): POC Glucose: 94 mg/dl (ref 70–99)

## 2020-11-08 NOTE — Assessment & Plan Note (Signed)
Well-controlled hypertension.  Continue Zestoretic 10-12.5 mg daily. Well-controlled diabetes with hemoglobin A1c of 6.2.  Continue Metformin 500 mg twice daily.

## 2020-11-08 NOTE — Assessment & Plan Note (Signed)
Fasting blood work done today. Continue rosuvastatin 40 mg daily. The 10-year ASCVD risk score Denman George DC Montez Hageman., et al., 2013) is: 29.6%   Values used to calculate the score:     Age: 69 years     Sex: Female     Is Non-Hispanic African American: Yes     Diabetic: Yes     Tobacco smoker: No     Systolic Blood Pressure: 138 mmHg     Is BP treated: Yes     HDL Cholesterol: 49 mg/dL     Total Cholesterol: 229 mg/dL

## 2020-11-08 NOTE — Progress Notes (Signed)
Eileen Jefferson 69 y.o.   Chief Complaint  Patient presents with   Gout    doing well no flares since last OV a few months ago    Hypertension    pt reports no physical symptoms and feels well BP today in range   Diabetes    pt reports good sugar readings at home not usually higher than 110     HISTORY OF PRESENT ILLNESS: This is a 69 y.o. female with history of diabetes and hypertension here for follow-up Doing well.  Has no complaints or medical concerns today. Gout much improved. Normal blood pressure readings at home.  Takes Zestoretic 10-12.5 mg daily Normal blood sugar levels at home.  Takes Metformin 500 mg twice a day. History of dyslipidemia on rosuvastatin 40 mg daily. Takes 1 baby aspirin daily.  Lab Results  Component Value Date   HGBA1C 6.2 (H) 04/26/2020    HPI   Prior to Admission medications   Medication Sig Start Date End Date Taking? Authorizing Provider  Accu-Chek Softclix Lancets lancets Test blood sugar once daily. Dx: E11.9 04/26/20  Yes Horald Pollen, MD  Alcohol Swabs PADS Test blood sugar once daily. Dx: E11.9 10/25/16  Yes Shawnee Knapp, MD  aspirin EC 81 MG tablet Take 81 mg by mouth daily.   Yes [provider]  Blood Glucose Calibration (ACCU-CHEK AVIVA) SOLN Test blood sugar once daily. Dx: E11.9 10/25/16  Yes Shawnee Knapp, MD  Blood Glucose Monitoring Suppl (ACCU-CHEK AVIVA PLUS) w/Device KIT Test blood sugar once daily. Dx: E11.9 10/25/16  Yes Shawnee Knapp, MD  brimonidine-timolol (COMBIGAN) 0.2-0.5 % ophthalmic solution Place 1 drop into the left eye 2 (two) times daily.     Yes [provider]  brinzolamide (AZOPT) 1 % ophthalmic suspension Place 1 drop into the left eye 2 (two) times daily.     Yes [provider]  colchicine 0.6 MG tablet Take 1.2 mg now and 0.6 mg one hour later. Take 0.6 mg daily after that x 5 days. 04/26/20  Yes Latash Nouri, Ines Bloomer, MD  famotidine (PEPCID) 40 MG tablet TAKE 1  TABLET (40 MG TOTAL) BY MOUTH 2  TIMES DAILY AS NEEDED FOR HEARTBURN OR INDIGESTION. 07/10/20  Yes Tawfiq Favila, Ines Bloomer, MD  glucose blood (ACCU-CHEK AVIVA PLUS) test strip Test blood sugar once daily. Dx: E11.9 04/26/20  Yes Horald Pollen, MD  lisinopril-hydrochlorothiazide (ZESTORETIC) 10-12.5 MG tablet Take 1 tablet by mouth daily. 04/26/20  Yes Alec Jaros, Ines Bloomer, MD  metFORMIN (GLUCOPHAGE) 500 MG tablet Take 1 tablet (500 mg total) by mouth 2 (two) times daily with a meal. 04/26/20  Yes Brice Potteiger, Ines Bloomer, MD  omeprazole (PRILOSEC) 40 MG capsule TAKE 1 CAPSULE (40 MG TOTAL) BY MOUTH DAILY. 30-60 MINUTES BEFORE BREAKFAST 10/23/20  Yes Lisamarie Coke, Ines Bloomer, MD  rosuvastatin (CRESTOR) 40 MG tablet Take 1 tablet (40 mg total) by mouth daily. 04/26/20  Yes Ayuub Penley, Ines Bloomer, MD  allopurinol (ZYLOPRIM) 100 MG tablet Take 0.5 tablets (50 mg total) by mouth daily. 05/28/20 08/26/20  Rutherford Guys, MD    Allergies  Allergen Reactions   Contrast Media [Iodinated Diagnostic Agents]    Iohexol Other (See Comments)    " made me feel like I was burning inside"    Patient Active Problem List   Diagnosis Date Noted   Retinal telangiectasia of right eye 05/18/2020   Retinal telangiectasia of left eye 05/18/2020   Cystoid macular edema of left eye 05/18/2020  Hyperuricemia 04/27/2020   Gastroesophageal reflux disease 11/23/2018   DDD (degenerative disc disease), cervical 07/11/2017   Localized osteoarthrosis of right shoulder region 07/11/2017   Anemia 07/11/2017   Hyperlipidemia 06/28/2016   Thyroid disease 06/28/2016   Diabetic retinopathy ([place]) 05/05/2015   Class 2 severe obesity due to excess calories with serious comorbidity and body mass index (BMI) of 38.0 to 38.9 in adult [facility]) 01/11/2015   Glaucoma 04/06/2014   Macular degeneration 04/06/2014   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, 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   HTN (hypertension) 10/02/2013   Sarcoidosis  10/02/2013   Diabetes mellitus, type 2 ([place]) 10/02/2013   OSA (obstructive sleep apnea) 10/23/2012    Past Medical History:  Diagnosis Date   DDD (degenerative disc disease), cervical 07/11/2017   C5-6 and C6-7 cervical spondylosis and degenerative disc disease on 2005 Xray   Diverticulosis    Glaucoma 04/06/2014   HTN (hypertension)    Hyperlipidemia    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, 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   Iron deficiency anemia    Pulmonary sarcoidosis (HCC)    Sleep apnea    Thyroid disease    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)     Past Surgical History:  Procedure Laterality Date   APPENDECTOMY     c-section     x 2   CATARACT EXTRACTION     bilateral   SCLERAL BUCKLE     right   TONSILLECTOMY     TOTAL ABDOMINAL HYSTERECTOMY     TUBAL LIGATION     VITRECTOMY     bilateral    Social History   Socioeconomic History   Marital status: Married    Spouse name: Not on file   Number of children: 2   Years of education: Not on file   Highest education level: Not on file  Occupational History   Occupation: behavioral health  Tobacco Use   Smoking status: Former Smoker    Packs/day: 0.20    Years: 1.00    Pack years: 0.20    Types: Cigarettes    Quit date: 12/11/1968    Years since quitting: 51.9   Smokeless tobacco: Never Used  Substance and Sexual Activity   Alcohol use: No   Drug use: No   Sexual activity: Not on file  Other Topics Concern   Not on file  Social History Narrative   Not on file   Social Determinants of Health   Financial Resource Strain:    Difficulty of Paying Living Expenses: Not on file  Food Insecurity:    Worried About Estate manager/land agent of Food in the Last Year: Not on file   [employer] of Food in the Last Year: Not on file  Transportation Needs:    Lack of Transportation (Medical): Not on file   Lack of Transportation (Non-Medical): Not on file  Physical  Activity:    Days of Exercise per Week: Not on file   Minutes of Exercise per Session: Not on file  Stress:    Feeling of Stress : Not on file  Social Connections:    Frequency of Communication with Friends and Family: Not on file   Frequency of Social Gatherings with Friends and Family: Not on file   Attends Religious Services: Not on file   Active Member of Clubs or Organizations: Not on file   Attends Archivist Meetings: Not on file   Marital Status: Not on file  Intimate Partner Violence:  Fear of Current or Ex-Partner: Not on file   Emotionally Abused: Not on file   Physically Abused: Not on file   Sexually Abused: Not on file    Family History  Problem Relation Age of Onset   Colon polyps Mother        brothers x2   Clotting disorder Mother        brother x 2, MGM   Heart disease Brother        mother, MGM   Diabetes Brother        x 2, MGM   Stroke Maternal Grandmother    Colon cancer Neg Hx    Breast cancer Neg Hx      Review of Systems  Constitutional: Negative.  Negative for chills and fever.  HENT: Negative.  Negative for congestion and sore throat.   Respiratory: Negative.  Negative for cough and shortness of breath.   Cardiovascular: Negative.  Negative for chest pain and palpitations.  Gastrointestinal: Negative.  Negative for abdominal pain, diarrhea, nausea and vomiting.  Genitourinary: Negative.  Negative for dysuria and hematuria.  Musculoskeletal: Negative.   Skin: Negative.  Negative for rash.  Neurological: Negative for dizziness and headaches.  All other systems reviewed and are negative.   Today's Vitals   11/08/20 1408  BP: 138/76  Pulse: 76  Resp: 16  Temp: 98.1 F (36.7 C)  TempSrc: Temporal  SpO2: 100%  Weight: 208 lb 9.6 oz (94.6 kg)  Height: 5' 3"  (1.6 m)   Body mass index is 36.95 kg/m. BP Readings from Last 3 Encounters:  11/08/20 138/76  04/26/20 109/72  04/21/20 130/74   Wt Readings  from Last 3 Encounters:  11/08/20 208 lb 9.6 oz (94.6 kg)  04/26/20 210 lb (95.3 kg)  04/21/20 212 lb 12.8 oz (96.5 kg)    Physical Exam Vitals reviewed.  Constitutional:      Appearance: Normal appearance.  HENT:     Head: Normocephalic.  Eyes:     Extraocular Movements: Extraocular movements intact.     Conjunctiva/sclera: Conjunctivae normal.     Pupils: Pupils are equal, round, and reactive to light.  Cardiovascular:     Rate and Rhythm: Normal rate and regular rhythm.     Pulses: Normal pulses.     Heart sounds: Normal heart sounds.  Pulmonary:     Effort: Pulmonary effort is normal.     Breath sounds: Normal breath sounds.  Musculoskeletal:        General: Normal range of motion.     Cervical back: Normal range of motion and neck supple.     Right lower leg: No edema.     Left lower leg: No edema.  Skin:    General: Skin is warm and dry.     Capillary Refill: Capillary refill takes less than 2 seconds.  Neurological:     General: No focal deficit present.     Mental Status: She is alert and oriented to person, place, and time.  Psychiatric:        Mood and Affect: Mood normal.        Behavior: Behavior normal.    Results for orders placed or performed in visit on 11/08/20 (from the past 24 hour(s))  POCT glucose (manual entry)     Status: None   Collection Time: 11/08/20  2:46 PM  Result Value Ref Range   POC Glucose 94 70 - 99 mg/dl  POCT glycosylated hemoglobin (Hb A1C)     Status: Abnormal  Collection Time: 11/08/20  2:51 PM  Result Value Ref Range   Hemoglobin A1C 6.2 (A) 4.0 - 5.6 %   HbA1c POC (<> result, manual entry)     HbA1c, POC (prediabetic range)     HbA1c, POC (controlled diabetic range)       ASSESSMENT & PLAN: Hypertension associated with diabetes ([place]) Well-controlled hypertension.  Continue Zestoretic 10-12.5 mg daily. Well-controlled diabetes with hemoglobin A1c of 6.2.  Continue Metformin 500 mg twice daily.  Dyslipidemia  associated with type 2 diabetes mellitus ([place]) Fasting blood work done today. Continue rosuvastatin 40 mg daily. The 10-year ASCVD risk score Mikey Bussing DC Brooke Bonito., et al., 2013) is: 29.6%   Values used to calculate the score:     Age: 68 years     Sex: Female     Is Non-Hispanic African American: Yes     Diabetic: Yes     Tobacco smoker: No     Systolic Blood Pressure: 725 mmHg     Is BP treated: Yes     HDL Cholesterol: 49 mg/dL     Total Cholesterol: 229 mg/dL   [place] was seen today for gout, hypertension and diabetes.  Diagnoses and all orders for this visit:  Hypertension associated with diabetes ([place]) -     Comprehensive metabolic panel -     POCT glucose (manual entry) -     POCT glycosylated hemoglobin (Hb A1C)  Dyslipidemia associated with type 2 diabetes mellitus (HCC) -     Lipid panel  History of gout  Body mass index (BMI) of 36.0-36.9 in adult  Stage 3a chronic kidney disease ([place])    Patient Instructions       If you have lab work done today you will be contacted with your lab results within the next 2 weeks.  If you have not heard from [place] then please contact us. The fastest way to get your results is to register for My Chart.   IF you received an x-ray today, you will receive an invoice from [facility]. Please contact [facility] at [phone] with questions or concerns regarding your invoice.   IF you received labwork today, you will receive an invoice from [facility]. Please contact [facility] at [phone] with questions or concerns regarding your invoice.   Our billing staff will not be able to assist you with questions regarding bills from these companies.  You will be contacted with the lab results as soon as they are available. The fastest way to get your results is to activate your My Chart account. Instructions are located on the last page of this paperwork. If you have not heard from [place] regarding the results in 2 weeks, please  contact this office.     Diabetes Mellitus and Nutrition, Adult When you have diabetes (diabetes mellitus), it is very important to have healthy eating habits because your blood sugar (glucose) levels are greatly affected by what you eat and drink. Eating healthy foods in the appropriate amounts, at about the same times every day, can help you:  Control your blood glucose.  Lower your risk of heart disease.  Improve your blood pressure.  Reach or maintain a healthy weight. Every person with diabetes is different, and each person has different needs for a meal plan. Your health care provider may recommend that you work with a diet and nutrition specialist (dietitian) to make a meal plan that is best for you. Your meal plan may vary depending on factors such as:  The  calories you need.  The medicines you take.  Your weight.  Your blood glucose, blood pressure, and cholesterol levels.  Your activity level.  Other health conditions you have, such as heart or kidney disease. How do carbohydrates affect me? Carbohydrates, also called carbs, affect your blood glucose level more than any other type of food. Eating carbs naturally raises the amount of glucose in your blood. Carb counting is a method for keeping track of how many carbs you eat. Counting carbs is important to keep your blood glucose at a healthy level, especially if you use insulin or take certain oral diabetes medicines. It is important to know how many carbs you can safely have in each meal. This is different for every person. Your dietitian can help you calculate how many carbs you should have at each meal and for each snack. Foods that contain carbs include:  Bread, cereal, rice, pasta, and crackers.  Potatoes and corn.  Peas, beans, and lentils.  Milk and yogurt.  Fruit and juice.  Desserts, such as cakes, cookies, ice cream, and candy. How does alcohol affect me? Alcohol can cause a sudden decrease in blood  glucose (hypoglycemia), especially if you use insulin or take certain oral diabetes medicines. Hypoglycemia can be a life-threatening condition. Symptoms of hypoglycemia (sleepiness, dizziness, and confusion) are similar to symptoms of having too much alcohol. If your health care provider says that alcohol is safe for you, follow these guidelines:  Limit alcohol intake to no more than 1 drink per day for nonpregnant women and 2 drinks per day for men. One drink equals 12 oz of beer, 5 oz of wine, or 1 oz of hard liquor.  Do not drink on an empty stomach.  Keep yourself hydrated with water, diet soda, or unsweetened iced tea.  Keep in mind that regular soda, juice, and other mixers may contain a lot of sugar and must be counted as carbs. What are tips for following this plan?  Reading food labels  Start by checking the serving size on the "Nutrition Facts" label of packaged foods and drinks. The amount of calories, carbs, fats, and other nutrients listed on the label is based on one serving of the item. Many items contain more than one serving per package.  Check the total grams (g) of carbs in one serving. You can calculate the number of servings of carbs in one serving by dividing the total carbs by 15. For example, if a food has 30 g of total carbs, it would be equal to 2 servings of carbs.  Check the number of grams (g) of saturated and trans fats in one serving. Choose foods that have low or no amount of these fats.  Check the number of milligrams (mg) of salt (sodium) in one serving. Most people should limit total sodium intake to less than 2,300 mg per day.  Always check the nutrition information of foods labeled as "low-fat" or "nonfat". These foods may be higher in added sugar or refined carbs and should be avoided.  Talk to your dietitian to identify your daily goals for nutrients listed on the label. Shopping  Avoid buying canned, premade, or processed foods. These foods tend to  be high in fat, sodium, and added sugar.  Shop around the outside edge of the grocery store. This includes fresh fruits and vegetables, bulk grains, fresh meats, and fresh dairy. Cooking  Use low-heat cooking methods, such as baking, instead of high-heat cooking methods like deep frying.  Lacinda Axon  using healthy oils, such as olive, canola, or sunflower oil.  Avoid cooking with butter, cream, or high-fat meats. Meal planning  Eat meals and snacks regularly, preferably at the same times every day. Avoid going long periods of time without eating.  Eat foods high in fiber, such as fresh fruits, vegetables, beans, and whole grains. Talk to your dietitian about how many servings of carbs you can eat at each meal.  Eat 4-6 ounces (oz) of lean protein each day, such as lean meat, chicken, fish, eggs, or tofu. One oz of lean protein is equal to: ? 1 oz of meat, chicken, or fish. ? 1 egg. ?  cup of tofu.  Eat some foods each day that contain healthy fats, such as avocado, nuts, seeds, and fish. Lifestyle  Check your blood glucose regularly.  Exercise regularly as told by your health care provider. This may include: ? 150 minutes of moderate-intensity or vigorous-intensity exercise each week. This could be brisk walking, biking, or water aerobics. ? Stretching and doing strength exercises, such as yoga or weightlifting, at least 2 times a week.  Take medicines as told by your health care provider.  Do not use any products that contain nicotine or tobacco, such as cigarettes and e-cigarettes. If you need help quitting, ask your health care provider.  Work with a Social worker or diabetes educator to identify strategies to manage stress and any emotional and social challenges. Questions to ask a health care provider  Do I need to meet with a diabetes educator?  Do I need to meet with a dietitian?  What number can I call if I have questions?  When are the best times to check my blood  glucose? Where to find more information:  American Diabetes Association: diabetes.org  Academy of Nutrition and Dietetics: www.eatright.[employer] of Diabetes and Digestive and Kidney Diseases (NIH): [employer] Summary  A healthy meal plan will help you control your blood glucose and maintain a healthy lifestyle.  Working with a diet and nutrition specialist (dietitian) can help you make a meal plan that is best for you.  Keep in mind that carbohydrates (carbs) and alcohol have immediate effects on your blood glucose levels. It is important to count carbs and to use alcohol carefully. This information is not intended to replace advice given to you by your health care provider. Make sure you discuss any questions you have with your health care provider. Document Revised: 11/09/2017 Document Reviewed: 01/01/2017 Elsevier Patient Education  2020 Elsevier Inc.      Agustina Caroli, MD [facility]

## 2020-11-08 NOTE — Patient Instructions (Addendum)
   If you have lab work done today you will be contacted with your lab results within the next 2 weeks.  If you have not heard from us then please contact us. The fastest way to get your results is to register for My Chart.   IF you received an x-ray today, you will receive an invoice from [facility]. Please contact [facility] at [phone] with questions or concerns regarding your invoice.   IF you received labwork today, you will receive an invoice from [facility]. Please contact [facility] at [phone] with questions or concerns regarding your invoice.   Our billing staff will not be able to assist you with questions regarding bills from these companies.  You will be contacted with the lab results as soon as they are available. The fastest way to get your results is to activate your My Chart account. Instructions are located on the last page of this paperwork. If you have not heard from us regarding the results in 2 weeks, please contact this office.      Diabetes Mellitus and Nutrition, Adult When you have diabetes (diabetes mellitus), it is very important to have healthy eating habits because your blood sugar (glucose) levels are greatly affected by what you eat and drink. Eating healthy foods in the appropriate amounts, at about the same times every day, can help you:  Control your blood glucose.  Lower your risk of heart disease.  Improve your blood pressure.  Reach or maintain a healthy weight. Every person with diabetes is different, and each person has different needs for a meal plan. Your health care provider may recommend that you work with a diet and nutrition specialist (dietitian) to make a meal plan that is best for you. Your meal plan may vary depending on factors such as:  The calories you need.  The medicines you take.  Your weight.  Your blood glucose, blood pressure, and cholesterol levels.  Your activity level.  Other health  conditions you have, such as heart or kidney disease. How do carbohydrates affect me? Carbohydrates, also called carbs, affect your blood glucose level more than any other type of food. Eating carbs naturally raises the amount of glucose in your blood. Carb counting is a method for keeping track of how many carbs you eat. Counting carbs is important to keep your blood glucose at a healthy level, especially if you use insulin or take certain oral diabetes medicines. It is important to know how many carbs you can safely have in each meal. This is different for every person. Your dietitian can help you calculate how many carbs you should have at each meal and for each snack. Foods that contain carbs include:  Bread, cereal, rice, pasta, and crackers.  Potatoes and corn.  Peas, beans, and lentils.  Milk and yogurt.  Fruit and juice.  Desserts, such as cakes, cookies, ice cream, and candy. How does alcohol affect me? Alcohol can cause a sudden decrease in blood glucose (hypoglycemia), especially if you use insulin or take certain oral diabetes medicines. Hypoglycemia can be a life-threatening condition. Symptoms of hypoglycemia (sleepiness, dizziness, and confusion) are similar to symptoms of having too much alcohol. If your health care provider says that alcohol is safe for you, follow these guidelines:  Limit alcohol intake to no more than 1 drink per day for nonpregnant women and 2 drinks per day for men. One drink equals 12 oz of beer, 5 oz of wine, or 1 oz of hard   liquor.  Do not drink on an empty stomach.  Keep yourself hydrated with water, diet soda, or unsweetened iced tea.  Keep in mind that regular soda, juice, and other mixers may contain a lot of sugar and must be counted as carbs. What are tips for following this plan?  Reading food labels  Start by checking the serving size on the "Nutrition Facts" label of packaged foods and drinks. The amount of calories, carbs, fats, and  other nutrients listed on the label is based on one serving of the item. Many items contain more than one serving per package.  Check the total grams (g) of carbs in one serving. You can calculate the number of servings of carbs in one serving by dividing the total carbs by 15. For example, if a food has 30 g of total carbs, it would be equal to 2 servings of carbs.  Check the number of grams (g) of saturated and trans fats in one serving. Choose foods that have low or no amount of these fats.  Check the number of milligrams (mg) of salt (sodium) in one serving. Most people should limit total sodium intake to less than 2,300 mg per day.  Always check the nutrition information of foods labeled as "low-fat" or "nonfat". These foods may be higher in added sugar or refined carbs and should be avoided.  Talk to your dietitian to identify your daily goals for nutrients listed on the label. Shopping  Avoid buying canned, premade, or processed foods. These foods tend to be high in fat, sodium, and added sugar.  Shop around the outside edge of the grocery store. This includes fresh fruits and vegetables, bulk grains, fresh meats, and fresh dairy. Cooking  Use low-heat cooking methods, such as baking, instead of high-heat cooking methods like deep frying.  Cook using healthy oils, such as olive, canola, or sunflower oil.  Avoid cooking with butter, cream, or high-fat meats. Meal planning  Eat meals and snacks regularly, preferably at the same times every day. Avoid going long periods of time without eating.  Eat foods high in fiber, such as fresh fruits, vegetables, beans, and whole grains. Talk to your dietitian about how many servings of carbs you can eat at each meal.  Eat 4-6 ounces (oz) of lean protein each day, such as lean meat, chicken, fish, eggs, or tofu. One oz of lean protein is equal to: ? 1 oz of meat, chicken, or fish. ? 1 egg. ?  cup of tofu.  Eat some foods each day that  contain healthy fats, such as avocado, nuts, seeds, and fish. Lifestyle  Check your blood glucose regularly.  Exercise regularly as told by your health care provider. This may include: ? 150 minutes of moderate-intensity or vigorous-intensity exercise each week. This could be brisk walking, biking, or water aerobics. ? Stretching and doing strength exercises, such as yoga or weightlifting, at least 2 times a week.  Take medicines as told by your health care provider.  Do not use any products that contain nicotine or tobacco, such as cigarettes and e-cigarettes. If you need help quitting, ask your health care provider.  Work with a counselor or diabetes educator to identify strategies to manage stress and any emotional and social challenges. Questions to ask a health care provider  Do I need to meet with a diabetes educator?  Do I need to meet with a dietitian?  What number can I call if I have questions?  When are the best   times to check my blood glucose? Where to find more information:  American Diabetes Association: diabetes.org  Academy of Nutrition and Dietetics: www.eatright.org  National Institute of Diabetes and Digestive and Kidney Diseases (NIH): www.niddk.nih.gov Summary  A healthy meal plan will help you control your blood glucose and maintain a healthy lifestyle.  Working with a diet and nutrition specialist (dietitian) can help you make a meal plan that is best for you.  Keep in mind that carbohydrates (carbs) and alcohol have immediate effects on your blood glucose levels. It is important to count carbs and to use alcohol carefully. This information is not intended to replace advice given to you by your health care provider. Make sure you discuss any questions you have with your health care provider. Document Revised: 11/09/2017 Document Reviewed: 01/01/2017 Elsevier Patient Education  2020 Elsevier Inc.  

## 2020-11-09 LAB — COMPREHENSIVE METABOLIC PANEL
ALT: 12 IU/L (ref 0–32)
AST: 15 IU/L (ref 0–40)
Albumin/Globulin Ratio: 1.1 — ABNORMAL LOW (ref 1.2–2.2)
Albumin: 3.7 g/dL — ABNORMAL LOW (ref 3.8–4.8)
Alkaline Phosphatase: 99 IU/L (ref 44–121)
BUN/Creatinine Ratio: 16 (ref 12–28)
BUN: 18 mg/dL (ref 8–27)
Bilirubin Total: <0.2 mg/dL (ref 0.0–1.2)
CO2: 28 mmol/L (ref 20–29)
Calcium: 10 mg/dL (ref 8.7–10.3)
Chloride: 105 mmol/L (ref 96–106)
Creatinine, Ser: 1.11 mg/dL — ABNORMAL HIGH (ref 0.57–1.00)
GFR calc Af Amer: 59 mL/min/{1.73_m2} — ABNORMAL LOW (ref >59)
GFR calc non Af Amer: 52 mL/min/{1.73_m2} — ABNORMAL LOW (ref >59)
Globulin, Total: 3.4 g/dL (ref 1.5–4.5)
Glucose: 82 mg/dL (ref 65–99)
Potassium: 4.3 mmol/L (ref 3.5–5.2)
Sodium: 142 mmol/L (ref 134–144)
Total Protein: 7.1 g/dL (ref 6.0–8.5)

## 2020-11-09 LAB — LIPID PANEL
Chol/HDL Ratio: 5.8 ratio — ABNORMAL HIGH (ref 0.0–4.4)
Cholesterol, Total: 237 mg/dL — ABNORMAL HIGH (ref 100–199)
HDL: 41 mg/dL (ref >39)
LDL Chol Calc (NIH): 153 mg/dL — ABNORMAL HIGH (ref 0–99)
Triglycerides: 232 mg/dL — ABNORMAL HIGH (ref 0–149)
VLDL Cholesterol Cal: 43 mg/dL — ABNORMAL HIGH (ref 5–40)

## 2020-11-13 DIAGNOSIS — G4733 Obstructive sleep apnea (adult) (pediatric): Secondary | ICD-10-CM | POA: Diagnosis not present

## 2020-11-15 ENCOUNTER — Ambulatory Visit (INDEPENDENT_AMBULATORY_CARE_PROVIDER_SITE_OTHER): Payer: Medicare HMO | Admitting: Emergency Medicine

## 2020-11-15 VITALS — BP 138/76 | Ht 63.0 in | Wt 208.0 lb

## 2020-11-15 DIAGNOSIS — Z0000 Encounter for general adult medical examination without abnormal findings: Secondary | ICD-10-CM | POA: Diagnosis not present

## 2020-11-15 NOTE — Progress Notes (Signed)
Presents today for [employer] Visit   Date of last exam: 11-08-2020  Interpreter used for this visit? No  I connected with  Eileen Jefferson on 11/15/20 by a telephone telemedicine application and verified that I am speaking with the correct person using two identifiers.   I discussed the limitations of evaluation and management by telemedicine. The patient expressed understanding and agreed to proceed.  Patient location: home  Provider location: in office  I provided 20 minutes of non face - to - face time during this encounter.   Patient Care Team: Horald Pollen, MD as PCP - General (Internal Medicine) Chesley Mires, MD as Referring Physician (Pulmonary Disease) Marylynn Pearson, MD as Consulting Physician (Ophthalmology) Irene Shipper, MD as Consulting Physician (Gastroenterology)   Other items to address today:   Discussed Eye/Dental Discussed Immunizations Patient is having a gout flare started medication  Will follow up if doesn't clear up Follow up schedule Sagardia 05-10-2020 @ 1;20   Other Screening: Last screening for diabetes: 11-08-2020 Last lipid screening: 11-08-2020  ADVANCE DIRECTIVES: Discussed: yes On File: no Materials Provided:yes  Immunization status:  Immunization History  Administered Date(s) Administered  . Fluad Quad(high Dose 65+) 09/22/2019  . Influenza, High Dose Seasonal PF 11/23/2018  . Influenza,inj,Quad PF,6+ Mos 10/02/2013, 01/11/2015, 01/20/2016, 01/11/2017, 02/13/2018  . Influenza-Unspecified 09/19/2020  . PFIZER SARS-COV-2 Vaccination 01/02/2020, 01/23/2020, 09/19/2020  . Pneumococcal Conjugate-13 11/23/2018  . Pneumococcal Polysaccharide-23 10/02/2013, 04/26/2020  . Tdap 01/11/2015     There are no preventive care reminders to display for this patient.   Functional Status Survey: Is the patient deaf or have difficulty hearing?: No Does the patient have difficulty seeing, even when wearing  glasses/contacts?: No Does the patient have difficulty concentrating, remembering, or making decisions?: No Does the patient have difficulty walking or climbing stairs?: No Does the patient have difficulty dressing or bathing?: No Does the patient have difficulty doing errands alone such as visiting a doctor's office or shopping?: No   6CIT Screen 11/15/2020  What Year? 0 points  What month? 0 points  What time? 0 points  Count back from 20 0 points  Months in reverse 0 points  Repeat phrase 0 points  Total Score 0        Clinical Support from 11/15/2020 in [facility]  AUDIT-C Score 2       Home Environment:   No trouble climbing stairs Lives in a two story home No scattered rugs Yes grab bars Adequate lighting/ no clutter   Patient Active Problem List   Diagnosis Date Noted  . Stage 3a chronic kidney disease ([place]) 11/08/2020  . Retinal telangiectasia of right eye 05/18/2020  . Retinal telangiectasia of left eye 05/18/2020  . Cystoid macular edema of left eye 05/18/2020  . Hyperuricemia 04/27/2020  . Gastroesophageal reflux disease 11/23/2018  . DDD (degenerative disc disease), cervical 07/11/2017  . Localized osteoarthrosis of right shoulder region 07/11/2017  . Anemia 07/11/2017  . Hyperlipidemia 06/28/2016  . Thyroid disease 06/28/2016  . Diabetic retinopathy ([place]) 05/05/2015  . Class 2 severe obesity due to excess calories with serious comorbidity and body mass index (BMI) of 38.0 to 38.9 in adult ([place]) 01/11/2015  . Glaucoma 04/06/2014  . Macular degeneration 04/06/2014  . 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 Hypertension associated with diabetes ([place]) 10/02/2013  . Sarcoidosis 10/02/2013  . Dyslipidemia associated with type 2 diabetes mellitus ([place]) 10/02/2013  . OSA (obstructive sleep apnea)  10/23/2012     Past Medical History:  Diagnosis Date  . DDD (degenerative disc disease), cervical 07/11/2017   C5-6 and C6-7  cervical spondylosis and degenerative disc disease on 2005 Xray  . Diverticulosis   . Glaucoma 04/06/2014  . HTN (hypertension)   . Hyperlipidemia   . 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 Iron deficiency anemia   . Pulmonary sarcoidosis ([place])   . Sleep apnea   . Thyroid disease   . 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 ABDOMINAL HYSTERECTOMY N/A    [employer]rees[employer] 11/14/2020  . APPENDECTOMY    . c-section     x 2  . CATARACT EXTRACTION     bilateral  . CESAREAN SECTION N/A    [employer]reesia 11/14/2020  . EYE SURGERY N/A    [employer]reesia 11/14/2020  . SCLERAL BUCKLE     right  . TONSILLECTOMY    . TOTAL ABDOMINAL HYSTERECTOMY    . TUBAL LIGATION    . VITRECTOMY     bilateral     Family History  Problem Relation Age of Onset  . Colon polyps Mother        brothers x2  . Clotting disorder Mother        brother x 2, MGM  . Heart disease Brother        mother, MGM  . Diabetes Brother        x 2, MGM  . Stroke Maternal Grandmother   . Colon cancer Neg Hx   . Breast cancer Neg Hx      Social History   Socioeconomic History  . Marital status: Married    Spouse name: Not on file  . Number of children: 2  . Years of education: Not on file  . Highest education level: Not on file  Occupational History  . Occupation: behavioral health  Tobacco Use  . Smoking status: Former Smoker    Packs/day: 0.20    Years: 1.00    Pack years: 0.20    Types: Cigarettes    Quit date: 12/11/1968    Years since quitting: 51.9  . Smokeless tobacco: Never Used  Substance and Sexual Activity  . Alcohol use: No  . Drug use: No  . Sexual activity: Not on file  Other Topics Concern  . Not on file  Social History Narrative  . Not on file   Social Determinants of Health   Financial Resource Strain:   . Difficulty of Paying Living Expenses: Not on file  Food Insecurity:   .  Worried About Charity fundraiser in the Last Year: Not on file  . Ran Out of Food in the Last Year: Not on file  Transportation Needs:   . Lack of Transportation (Medical): Not on file  . Lack of Transportation (Non-Medical): Not on file  Physical Activity:   . Days of Exercise per Week: Not on file  . Minutes of Exercise per Session: Not on file  Stress:   . Feeling of Stress : Not on file  Social Connections:   . Frequency of Communication with Friends and Family: Not on file  . Frequency of Social Gatherings with Friends and Family: Not on file  . Attends Religious Services: Not on file  . Active Member of Clubs or Organizations: Not on file  . Attends Archivist Meetings: Not on file  .  Marital Status: Not on file  Intimate Partner Violence:   . Fear of Current or Ex-Partner: Not on file  . Emotionally Abused: Not on file  . Physically Abused: Not on file  . Sexually Abused: Not on file     Allergies  Allergen Reactions  . Contrast Media [Iodinated Diagnostic Agents]   . Iohexol Other (See Comments)    " made me feel like I was burning inside"     Prior to Admission medications   Medication Sig Start Date End Date Taking? Authorizing Provider  Accu-Chek Softclix Lancets lancets Test blood sugar once daily. Dx: E11.9 04/26/20  Yes Horald Pollen, MD  Alcohol Swabs PADS Test blood sugar once daily. Dx: E11.9 10/25/16  Yes Shawnee Knapp, MD  aspirin EC 81 MG tablet Take 81 mg by mouth daily.   Yes [provider]  Blood Glucose Calibration (ACCU-CHEK AVIVA) SOLN Test blood sugar once daily. Dx: E11.9 10/25/16  Yes Shawnee Knapp, MD  Blood Glucose Monitoring Suppl (ACCU-CHEK AVIVA PLUS) w/Device KIT Test blood sugar once daily. Dx: E11.9 10/25/16  Yes Shawnee Knapp, MD  brimonidine-timolol (COMBIGAN) 0.2-0.5 % ophthalmic solution Place 1 drop into the left eye 2 (two) times daily.     Yes [provider]  brinzolamide (AZOPT) 1 % ophthalmic  suspension Place 1 drop into the left eye 2 (two) times daily.     Yes [provider]  colchicine 0.6 MG tablet Take 1.2 mg now and 0.6 mg one hour later. Take 0.6 mg daily after that x 5 days. 04/26/20  Yes Sagardia, Ines Bloomer, MD  famotidine (PEPCID) 40 MG tablet TAKE 1 TABLET (40 MG TOTAL) BY MOUTH 2  TIMES DAILY AS NEEDED FOR HEARTBURN OR INDIGESTION. 07/10/20  Yes Sagardia, Ines Bloomer, MD  glucose blood (ACCU-CHEK AVIVA PLUS) test strip Test blood sugar once daily. Dx: E11.9 04/26/20  Yes Horald Pollen, MD  lisinopril-hydrochlorothiazide (ZESTORETIC) 10-12.5 MG tablet Take 1 tablet by mouth daily. 04/26/20  Yes Sagardia, Ines Bloomer, MD  metFORMIN (GLUCOPHAGE) 500 MG tablet Take 1 tablet (500 mg total) by mouth 2 (two) times daily with a meal. 04/26/20  Yes Sagardia, Ines Bloomer, MD  omeprazole (PRILOSEC) 40 MG capsule TAKE 1 CAPSULE (40 MG TOTAL) BY MOUTH DAILY. 30-60 MINUTES BEFORE BREAKFAST 10/23/20  Yes Sagardia, Ines Bloomer, MD  rosuvastatin (CRESTOR) 40 MG tablet Take 1 tablet (40 mg total) by mouth daily. 04/26/20  Yes Sagardia, Ines Bloomer, MD  allopurinol (ZYLOPRIM) 100 MG tablet Take 0.5 tablets (50 mg total) by mouth daily. 05/28/20 08/26/20  Rutherford Guys, MD     Depression screen [facility] 2/9 11/15/2020 11/08/2020 04/26/2020 10/28/2019 09/22/2019  Decreased Interest 0 0 0 0 0  Down, Depressed, Hopeless 0 0 0 0 0  PHQ - 2 Score 0 0 0 0 0     Fall Risk  11/15/2020 11/08/2020 04/26/2020 10/28/2019 09/22/2019  Falls in the past year? - 0 0 0 0  Number falls in past yr: 0 0 - - -  Injury with Fall? 0 0 - - -  Risk for fall due to : - No Fall Risks - - -  Follow up Falls evaluation completed;Education provided Falls evaluation completed Falls evaluation completed Falls evaluation completed Falls evaluation completed      PHYSICAL EXAM: BP 138/76 Comment: taken from a previous visit  Ht _0  (1.6 m)   Wt 208 lb (94.3 kg)   BMI 36.85 kg/m  Wt Readings from  Last 3 Encounters:  11/15/20 208 lb (94.3 kg)  11/08/20 208 lb 9.6 oz (94.6 kg)  04/26/20 210 lb (95.3 kg)       Education/Counseling provided regarding diet and exercise, prevention of chronic diseases, smoking/tobacco cessation, if applicable, and reviewed "Covered Medicare Preventive Services."

## 2020-11-15 NOTE — Patient Instructions (Signed)
Thank you for taking time to come for your Medicare Wellness Visit. I appreciate your ongoing commitment to your health goals. Please review the following plan we discussed and let me know if I can assist you in the future.  Leroy Kennedy LPN Advance Directive  Advance directives are legal documents that let you make choices ahead of time about your health care and medical treatment in case you become unable to communicate for yourself. Advance directives are a way for you to make known your wishes to family, friends, and health care providers. This can let others know about your end-of-life care if you become unable to communicate. Discussing and writing advance directives should happen over time rather than all at once. Advance directives can be changed depending on your situation and what you want, even after you have signed the advance directives. There are different types of advance directives, such as:  Medical power of attorney.  Living will.  Do not resuscitate (DNR) or do not attempt resuscitation (DNAR) order. Health care proxy and medical power of attorney A health care proxy is also called a health care agent. This is a person who is appointed to make medical decisions for you in cases where you are unable to make the decisions yourself. Generally, people choose someone they know well and trust to represent their preferences. Make sure to ask this person for an agreement to act as your proxy. A proxy may have to exercise judgment in the event of a medical decision for which your wishes are not known. A medical power of attorney is a legal document that names your health care proxy. Depending on the laws in your state, after the document is written, it may also need to be:  Signed.  Notarized.  Dated.  Copied.  Witnessed.  Incorporated into your medical record. You may also want to appoint someone to manage your money in a situation in which you are unable to do so. This is  called a durable power of attorney for finances. It is a separate legal document from the durable power of attorney for health care. You may choose the same person or someone different from your health care proxy to act as your agent in money matters. If you do not appoint a proxy, or if there is a concern that the proxy is not acting in your best interests, a court may appoint a guardian to act on your behalf. Living will A living will is a set of instructions that state your wishes about medical care when you cannot express them yourself. Health care providers should keep a copy of your living will in your medical record. You may want to give a copy to family members or friends. To alert caregivers in case of an emergency, you can place a card in your wallet to let them know that you have a living will and where they can find it. A living will is used if you become:  Terminally ill.  Disabled.  Unable to communicate or make decisions. Items to consider in your living will include:  To use or not to use life-support equipment, such as dialysis machines and breathing machines (ventilators).  A DNR or DNAR order. This tells health care providers not to use cardiopulmonary resuscitation (CPR) if breathing or heartbeat stops.  To use or not to use tube feeding.  To be given or not to be given food and fluids.  Comfort (palliative) care when the goal becomes comfort rather than a  a cure.  Donation of organs and tissues. A living will does not give instructions for distributing your money and property if you should pass away. DNR or DNAR A DNR or DNAR order is a request not to have CPR in the event that your heart stops beating or you stop breathing. If a DNR or DNAR order has not been made and shared, a health care provider will try to help any patient whose heart has stopped or who has stopped breathing. If you plan to have surgery, talk with your health care provider about how your DNR or DNAR  order will be followed if problems occur. What if I do not have an advance directive? If you do not have an advance directive, some states assign family decision makers to act on your behalf based on how closely you are related to them. Each state has its own laws about advance directives. You may want to check with your health care provider, attorney, or state representative about the laws in your state. Summary  Advance directives are the legal documents that allow you to make choices ahead of time about your health care and medical treatment in case you become unable to tell others about your care.  The process of discussing and writing advance directives should happen over time. You can change the advance directives, even after you have signed them.  Advance directives include DNR or DNAR orders, living wills, and designating an agent as your medical power of attorney. This information is not intended to replace advice given to you by your health care provider. Make sure you discuss any questions you have with your health care provider. Document Revised: 06/26/2019 Document Reviewed: 06/26/2019 Elsevier Patient Education  2020 Else[employer]. Preventive Care 67 Years and Older, Female Preventive care refers to lifestyle choices and visits with your health care provider that can promote health and wellness. This includes:  A yearly physical exam. This is also called an annual well check.  Regular dental and eye exams.  Immunizations.  Screening for certain conditions.  Healthy lifestyle choices, such as diet and exercise. What can I expect for my preventive care visit? Physical exam Your health care provider will check:  Height and weight. These may be used to calculate body mass index (BMI), which is a measurement that tells if you are at a healthy weight.  Heart rate and blood pressure.  Your skin for abnormal spots. Counseling Your health care provider may ask you questions  about:  Alcohol, tobacco, and drug use.  Emotional well-being.  Home and relationship well-being.  Sexual activity.  Eating habits.  History of falls.  Memory and ability to understand (cognition).  Work and work environment.  Pregnancy and menstrual history. What immunizations do I need?  Influenza (flu) vaccine  This is recommended every year. Tetanus, diphtheria, and pertussis (Tdap) vaccine  You may need a Td booster every 10 years. Varicella (chickenpox) vaccine  You may need this vaccine if you have not already been vaccinated. Zoster (shingles) vaccine  You may need this after age 60. Pneumococcal conjugate (PCV13) vaccine  One dose is recommended after age 65. Pneumococcal polysaccharide (PPSV23) vaccine  One dose is recommended after age 65. Measles, mumps, and rubella (MMR) vaccine  You may need at least one dose of MMR if you were born in 1957 or later. You may also need a second dose. Meningococcal conjugate (MenACWY) vaccine  You may need this if you have certain conditions. Hepatitis A vaccine    You may need this if you have certain conditions or if you travel or work in places where you may be exposed to hepatitis A. Hepatitis B vaccine  You may need this if you have certain conditions or if you travel or work in places where you may be exposed to hepatitis B. Haemophilus influenzae type b (Hib) vaccine  You may need this if you have certain conditions. You may receive vaccines as individual doses or as more than one vaccine together in one shot (combination vaccines). Talk with your health care provider about the risks and benefits of combination vaccines. What tests do I need? Blood tests  Lipid and cholesterol levels. These may be checked every 5 years, or more frequently depending on your overall health.  Hepatitis C test.  Hepatitis B test. Screening  Lung cancer screening. You may have this screening every year starting at age 55 if  you have a 30-pack-year history of smoking and currently smoke or have quit within the past 15 years.  Colorectal cancer screening. All adults should have this screening starting at age 50 and continuing until age 75. Your health care provider may recommend screening at age 45 if you are at increased risk. You will have tests every 1-10 years, depending on your results and the type of screening test.  Diabetes screening. This is done by checking your blood sugar (glucose) after you have not eaten for a while (fasting). You may have this done every 1-3 years.  Mammogram. This may be done every 1-2 years. Talk with your health care provider about how often you should have regular mammograms.  BRCA-related cancer screening. This may be done if you have a family history of breast, ovarian, tubal, or peritoneal cancers. Other tests  Sexually transmitted disease (STD) testing.  Bone density scan. This is done to screen for osteoporosis. You may have this done starting at age 65. Follow these instructions at home: Eating and drinking  Eat a diet that includes fresh fruits and vegetables, whole grains, lean protein, and low-fat dairy products. Limit your intake of foods with high amounts of sugar, saturated fats, and salt.  Take vitamin and mineral supplements as recommended by your health care provider.  Do not drink alcohol if your health care provider tells you not to drink.  If you drink alcohol: ? Limit how much you have to 0-1 drink a day. ? Be aware of how much alcohol is in your drink. In the [place] one drink equals one 12 oz bottle of beer (355 mL), one 5 oz glass of wine (148 mL), or one 1 oz glass of hard liquor (44 mL). Lifestyle  Take daily care of your teeth and gums.  Stay active. Exercise for at least 30 minutes on 5 or more days each week.  Do not use any products that contain nicotine or tobacco, such as cigarettes, e-cigarettes, and chewing tobacco. If you need help  quitting, ask your health care provider.  If you are sexually active, practice safe sex. Use a condom or other form of protection in order to prevent STIs (sexually transmitted infections).  Talk with your health care provider about taking a low-dose aspirin or statin. What's next?  Go to your health care provider once a year for a well check visit.  Ask your health care provider how often you should have your eyes and teeth checked.  Stay up to date on all vaccines. This information is not intended to replace advice given to you   by your health care provider. Make sure you discuss any questions you have with your health care provider. Document Revised: 11/21/2018 Document Reviewed: 11/21/2018 Elsevier Patient Education  2020 Elsevier Inc.  

## 2020-11-16 ENCOUNTER — Encounter (INDEPENDENT_AMBULATORY_CARE_PROVIDER_SITE_OTHER): Payer: Self-pay | Admitting: Ophthalmology

## 2020-11-16 ENCOUNTER — Ambulatory Visit (INDEPENDENT_AMBULATORY_CARE_PROVIDER_SITE_OTHER): Payer: Medicare HMO | Admitting: Ophthalmology

## 2020-11-16 ENCOUNTER — Other Ambulatory Visit: Payer: Self-pay

## 2020-11-16 DIAGNOSIS — H35352 Cystoid macular degeneration, left eye: Secondary | ICD-10-CM

## 2020-11-16 DIAGNOSIS — G4733 Obstructive sleep apnea (adult) (pediatric): Secondary | ICD-10-CM | POA: Diagnosis not present

## 2020-11-16 DIAGNOSIS — H35073 Retinal telangiectasis, bilateral: Secondary | ICD-10-CM

## 2020-11-16 DIAGNOSIS — H35071 Retinal telangiectasis, right eye: Secondary | ICD-10-CM | POA: Diagnosis not present

## 2020-11-16 DIAGNOSIS — H35072 Retinal telangiectasis, left eye: Secondary | ICD-10-CM

## 2020-11-16 NOTE — Assessment & Plan Note (Signed)
OS, best visual acuity eye, complete resolution of subfoveal serous retinal detachment noted October 2020 as well as complete resolution of CME temporally each with the rest duration of use of CPAP use November 2020

## 2020-11-16 NOTE — Assessment & Plan Note (Signed)
Resolved

## 2020-11-16 NOTE — Assessment & Plan Note (Signed)
OD much improved overall as compared to the chronic condition prior to restart of CPAP use, November 2020

## 2020-11-16 NOTE — Progress Notes (Signed)
11/16/2020     CHIEF COMPLAINT Patient presents for Retina Follow Up   HISTORY OF PRESENT ILLNESS: Eileen Jefferson is a 69 y.o. female who presents to the clinic today for:   HPI    Retina Follow Up    Patient presents with  Other.  In left eye.  This started 6 months ago.  Severity is mild.  Duration of 6 months.  Since onset it is stable.          Comments    6 Month F/U OU  Pt denies noticeable changes to [place] OU since last visit. Pt denies ocular pain, flashes of light, or floaters OU. Pt sts she is using CPAP machine nightly.        Last edited by Rockie Neighbours, [place] on 11/16/2020  8:13 AM. (History)      Referring physician: Horald Pollen, MD [place],   [id number]  HISTORICAL INFORMATION:   Selected notes from the MEDICAL RECORD NUMBER    Lab Results  Component Value Date   HGBA1C 6.2 (A) 11/08/2020     CURRENT MEDICATIONS: Current Outpatient Medications (Ophthalmic Drugs)  Medication Sig  . brimonidine-timolol (COMBIGAN) 0.2-0.5 % ophthalmic solution Place 1 drop into the left eye 2 (two) times daily.    . brinzolamide (AZOPT) 1 % ophthalmic suspension Place 1 drop into the left eye 2 (two) times daily.    . Travoprost, BAK Free, (TRAVATAN) 0.004 % SOLN ophthalmic solution 1 drop at bedtime.   No current facility-administered medications for this visit. (Ophthalmic Drugs)   Current Outpatient Medications (Other)  Medication Sig  . Accu-Chek Softclix Lancets lancets Test blood sugar once daily. Dx: E11.9  . Alcohol Swabs PADS Test blood sugar once daily. Dx: E11.9  . allopurinol (ZYLOPRIM) 100 MG tablet Take 0.5 tablets (50 mg total) by mouth daily.  Marland Kitchen aspirin EC 81 MG tablet Take 81 mg by mouth daily.  . Blood Glucose Calibration (ACCU-CHEK AVIVA) SOLN Test blood sugar once daily. Dx: E11.9  . Blood Glucose Monitoring Suppl (ACCU-CHEK AVIVA PLUS) w/Device KIT Test blood sugar once daily. Dx: E11.9  . colchicine 0.6 MG  tablet Take 1.2 mg now and 0.6 mg one hour later. Take 0.6 mg daily after that x 5 days.  . famotidine (PEPCID) 40 MG tablet TAKE 1 TABLET (40 MG TOTAL) BY MOUTH 2  TIMES DAILY AS NEEDED FOR HEARTBURN OR INDIGESTION.  Marland Kitchen glucose blood (ACCU-CHEK AVIVA PLUS) test strip Test blood sugar once daily. Dx: E11.9  . lisinopril-hydrochlorothiazide (ZESTORETIC) 10-12.5 MG tablet Take 1 tablet by mouth daily.  . metFORMIN (GLUCOPHAGE) 500 MG tablet Take 1 tablet (500 mg total) by mouth 2 (two) times daily with a meal.  . omeprazole (PRILOSEC) 40 MG capsule TAKE 1 CAPSULE (40 MG TOTAL) BY MOUTH DAILY. 30-60 MINUTES BEFORE BREAKFAST  . rosuvastatin (CRESTOR) 40 MG tablet Take 1 tablet (40 mg total) by mouth daily.   No current facility-administered medications for this visit. (Other)      REVIEW OF SYSTEMS:    ALLERGIES Allergies  Allergen Reactions  . Contrast Media [Iodinated Diagnostic Agents]   . Iohexol Other (See Comments)    " made me feel like I was burning inside"    PAST MEDICAL HISTORY Past Medical History:  Diagnosis Date  . DDD (degenerative disc disease), cervical 07/11/2017   C5-6 and C6-7 cervical spondylosis and degenerative disc disease on 2005 Xray  . Diverticulosis   . Glaucoma 04/06/2014  .  HTN (hypertension)   . Hyperlipidemia   . 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 Iron deficiency anemia   . 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 Sleep apnea   . Thyroid disease   . 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 ABDOMINAL HYSTERECTOMY N/A    [employer]rees[employer] 11/14/2020  . APPENDECTOMY    . c-section     x 2  . CATARACT EXTRACTION     bilateral  . CESAREAN SECTION N/A    [employer] 11/14/2020  . EYE SURGERY N/A    [employer] 11/14/2020  . SCLERAL BUCKLE     right  . TONSILLECTOMY    . TOTAL ABDOMINAL HYSTERECTOMY    . TUBAL LIGATION    . VITRECTOMY     bilateral     FAMILY HISTORY Family History  Problem Relation Age of Onset  . Colon polyps Mother        brothers x2  . Clotting disorder Mother        brother x 2, MGM  . Heart disease Brother        mother, MGM  . Diabetes Brother        x 2, MGM  . Stroke Maternal Grandmother   . Colon cancer Neg Hx   . Breast cancer Neg Hx     SOCIAL HISTORY Social History   Tobacco Use  . Smoking status: Former Smoker    Packs/day: 0.20    Years: 1.00    Pack years: 0.20    Types: Cigarettes    Quit date: 12/11/1968    Years since quitting: 51.9  . Smokeless tobacco: Never Used  Substance Use Topics  . Alcohol use: No  . Drug use: No         OPHTHALMIC EXAM: Base Eye Exam    Visual Acuity (ETDRS)      Right Left   Dist cc 20/200 20/50   Dist ph cc NI 20/40 -1   Correction: Glasses       Tonometry (Tonopen, 8:12 AM)      Right Left   Pressure 17 16       Pupils      Dark Light Shape React APD   Right 3 2 Irregular Brisk None   Left 3 2 Round Brisk None       Visual Fields (Counting fingers)      Left Right    Full    Restrictions  Total superior temporal, inferior temporal, inferior nasal deficiencies       Extraocular Movement      Right Left    Full Full       Neuro/Psych    Oriented x3: Yes   Mood/Affect: Normal       Dilation    Both eyes: 1.0% Mydriacyl, 2.5% Phenylephrine @ 8:16 AM        Slit Lamp and Fundus Exam    External Exam      Right Left   External Normal Normal       Slit Lamp Exam      Right Left   Lids/Lashes Normal Normal   Conjunctiva/Sclera White and quiet White and quiet   Cornea Clear Clear   Anterior Chamber Deep and quiet Deep and quiet   Iris Round and reactive Round and reactive   Lens Anterior chamber intraocular lens Posterior chamber intraocular lens   Anterior Vitreous Normal  Normal       Fundus Exam      Right Left   Posterior Vitreous Vitrectomized Vitrectomized   Disc 2+ Pallor Pallor 1+   C/D Ratio 0.35  0.6-0.7   Macula Mild epiretinal membrane No obvious CME.   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   Periphery Good PRP, hyperpigmented Good PRP, hyperpigmented          IMAGING AND PROCEDURES  Imaging and Procedures for 11/16/20  OCT, Retina - OU - Both Eyes       Right Eye Quality was good. Central Foveal Thickness: 349. Progression has improved. Findings include cystoid macular edema.   Left Eye Quality was good. Scan locations included subfoveal. Central Foveal Thickness: 288. Progression has improved.   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 Retinal telangiectasia of right eye  H35.071 OCT, Retina - OU - Both Eyes  3. Cystoid macular edema of left eye  H35.352   4. OSA (obstructive sleep apnea)  G47.33     1.  Improved macular condition OU on only CPAP use as the note of therapy,  CME OS and MAC-TEL completely resolved over 2 months.,  See  November 2020 through January 2021 FFA  2.  3.  Ophthalmic Meds Ordered this visit:  No orders of the defined types were placed in this encounter.      Return in about 6 months (around 05/17/2021) for DILATE OU, COLOR FP, OCT.  There are no Patient Instructions on file for this visit.   Explained the diagnoses, plan, and follow up with the patient and they expressed understanding.  Patient expressed understanding of the importance of proper follow up care.   Clent Demark Nichols Corter M.D. Diseases & Surgery of the Retina and Vitreous Retina & Diabetic [place] 11/16/20     Abbreviations: M myopia (nearsighted); A astigmatism; H hyperopia (farsighted); P presbyopia; Mrx spectacle prescription;  CTL contact lenses; OD right eye; OS left eye; OU both eyes  XT exotropia; ET esotropia; PEK punctate epithelial keratitis; PEE punctate epithelial erosions; DES dry eye syndrome; MGD meibomian gland dysfunction; ATs artificial tears; PFAT's preservative free artificial tears; [place] nuclear sclerotic cataract; PSC posterior subcapsular cataract; ERM epi-retinal membrane; PVD posterior vitreous detachment; RD retinal detachment; DM diabetes mellitus; DR diabetic retinopathy; NPDR non-proliferative diabetic retinopathy; PDR proliferative diabetic retinopathy; CSME clinically significant macular edema; DME diabetic macular edema; dbh dot blot  hemorrhages; CWS cotton wool spot; POAG primary open angle glaucoma; C/D cup-to-disc ratio; HVF humphrey visual field; GVF goldmann visual field; OCT optical coherence tomography; IOP intraocular pressure; BRVO Branch retinal vein occlusion; CRVO central retinal vein occlusion; CRAO central retinal artery occlusion; BRAO branch retinal artery occlusion; RT retinal tear; SB scleral buckle; PPV pars plana vitrectomy; VH Vitreous hemorrhage; PRP panretinal laser photocoagulation; IVK intravitreal kenalog; VMT vitreomacular traction; MH Macular hole;  NVD neovascularization of the  disc; NVE neovascularization elsewhere; AREDS age related eye disease study; ARMD age related macular degeneration; POAG primary open angle glaucoma; EBMD epithelial/anterior basement membrane dystrophy; ACIOL anterior chamber intraocular lens; IOL intraocular lens; PCIOL posterior chamber intraocular lens; Phaco/IOL phacoemulsification with intraocular lens placement; [place] photorefractive keratectomy; LASIK laser assisted in situ keratomileusis; HTN hypertension; DM diabetes mellitus; COPD chronic obstructive pulmonary disease

## 2020-11-16 NOTE — Assessment & Plan Note (Signed)
Patient sense of wellbeing is improved dramatically since restoration of use of CPAP

## 2020-11-22 DIAGNOSIS — G4733 Obstructive sleep apnea (adult) (pediatric): Secondary | ICD-10-CM | POA: Diagnosis not present

## 2020-12-14 DIAGNOSIS — G4733 Obstructive sleep apnea (adult) (pediatric): Secondary | ICD-10-CM | POA: Diagnosis not present

## 2020-12-29 ENCOUNTER — Other Ambulatory Visit: Payer: Self-pay | Admitting: Emergency Medicine

## 2020-12-29 ENCOUNTER — Other Ambulatory Visit: Payer: Self-pay | Admitting: Pulmonary Disease

## 2020-12-29 ENCOUNTER — Other Ambulatory Visit: Payer: Self-pay

## 2020-12-29 DIAGNOSIS — Z1231 Encounter for screening mammogram for malignant neoplasm of breast: Secondary | ICD-10-CM

## 2021-01-07 ENCOUNTER — Other Ambulatory Visit: Payer: Self-pay | Admitting: Emergency Medicine

## 2021-01-07 DIAGNOSIS — K219 Gastro-esophageal reflux disease without esophagitis: Secondary | ICD-10-CM

## 2021-01-14 DIAGNOSIS — G4733 Obstructive sleep apnea (adult) (pediatric): Secondary | ICD-10-CM | POA: Diagnosis not present

## 2021-02-09 ENCOUNTER — Ambulatory Visit: Payer: Medicare HMO

## 2021-02-11 DIAGNOSIS — G4733 Obstructive sleep apnea (adult) (pediatric): Secondary | ICD-10-CM | POA: Diagnosis not present

## 2021-02-25 DIAGNOSIS — G4733 Obstructive sleep apnea (adult) (pediatric): Secondary | ICD-10-CM | POA: Diagnosis not present

## 2021-04-04 ENCOUNTER — Ambulatory Visit: Payer: Medicare HMO

## 2021-04-21 ENCOUNTER — Ambulatory Visit: Payer: Medicare HMO | Admitting: Internal Medicine

## 2021-05-10 ENCOUNTER — Ambulatory Visit: Payer: Self-pay | Admitting: Emergency Medicine

## 2021-05-12 ENCOUNTER — Ambulatory Visit: Payer: Medicare HMO | Admitting: Internal Medicine

## 2021-05-14 NOTE — Progress Notes (Signed)
HPI F former smoker followed for OSA, complicated by HBP, GERD, DM2 with retinopathy, Macular degeneration, Cervical DDD, Sarcoid/ Iridocyclitis, Glaucoma, Obesity, PSG 10/11/04>>AHI 16.7, SpO2 low 85% PSG 10/31/12>>AHI 12.9, REM 44.8, SpO2 low 59%, PLMI 0.  HST 01/08/20- AHI 17.1/ hr, desaturation to 70%, body weight 211 lbs  -----------------------------------------------------------------------------------------------   04/21/20-  68 yoF former smoker followed for OSA, complicated by HBP, GERD, DM2 with retinopathy, Macular degeneration, Cervical DDD, Sarcoid/ Iridocyclitis, Glaucoma, Obesity, -----f/u OSA Body weight today 212 lbs CPAP auto 5-20/ [facility] Download compliance 97%, AHI 0.4/ hr 2  [facility] Covax No complaints. Better with CPAP. Reviewed download.  Denies interval health concerns.   05/16/21- 55 yoF former smoker followed for OSA, complicated by HTN, GERD, DM2 with retinopathy, Macular degeneration, Cervical DDD, Sarcoid/ Iridocyclitis, Glaucoma, Obesity, CPAP auto 5-20/ [facility] Download-compliance 87%, AHI 0.4/ hr Body weight today-207 lbs Covid vax- 4 Phizer Say Dr Luciana Axe is pleased with the way her eyes look now, using CPAP.  She feels better rested.   ROS-see HPI   + = positive Constitutional:    weight loss, night sweats, fevers, chills, fatigue, lassitude. HEENT:    headaches, difficulty swallowing, tooth/dental problems, sore throat,       sneezing, itching, ear ache, nasal congestion, post nasal drip, snoring CV:    chest pain, orthopnea, PND, swelling in lower extremities, anasarca,                                   dizziness, palpitations Resp:   shortness of breath with exertion or at rest.                productive cough,   non-productive cough, coughing up of blood.              change in color of mucus.  wheezing.   Skin:    rash or lesions. GI:    +heartburn, indigestion, abdominal pain, nausea, vomiting, diarrhea,                 change in bowel habits,  loss of appetite GU: dysuria, change in color of urine, no urgency or frequency.   flank pain. MS:   joint pain, stiffness, decreased range of motion, back pain. Neuro-     nothing unusual Psych:  change in mood or affect.  depression or anxiety.   memory loss.  OBJ- Physical Exam General- Alert, Oriented, Affect-appropriate, Distress- none acute, + obese Skin- rash-none, lesions- none, excoriation- none Lymphadenopathy- none Head- atraumatic            Eyes- Gross vision intact, PERRLA, conjunctivae and secretions clear            Ears- Hearing, canals-normal            Nose- Clear, no-Septal dev, mucus, polyps, erosion, perforation             Throat- Mallampati IV , mucosa clear , drainage- none, tonsils- atrophic,  + teeth Neck- flexible , trachea midline, no stridor , thyroid nl, carotid no bruit Chest - symmetrical excursion , unlabored           Heart/CV- RRR , no murmur , no gallop  , no rub, nl s1 s2                           - JVD- none , edema- none, stasis changes- none,  varices- none           Lung- clear to P&A, wheeze- none, cough- none , dullness-none, rub- none           Chest wall-  Abd-  Br/ Gen/ Rectal- Not done, not indicated Extrem- cyanosis- none, clubbing, none, atrophy- none, strength- nl Neuro- grossly intact to observation

## 2021-05-16 ENCOUNTER — Encounter: Payer: Self-pay | Admitting: Internal Medicine

## 2021-05-16 ENCOUNTER — Ambulatory Visit (INDEPENDENT_AMBULATORY_CARE_PROVIDER_SITE_OTHER): Payer: Medicare HMO | Admitting: Ophthalmology

## 2021-05-16 ENCOUNTER — Ambulatory Visit (INDEPENDENT_AMBULATORY_CARE_PROVIDER_SITE_OTHER): Payer: Medicare HMO | Admitting: Internal Medicine

## 2021-05-16 ENCOUNTER — Other Ambulatory Visit: Payer: Self-pay

## 2021-05-16 ENCOUNTER — Encounter (INDEPENDENT_AMBULATORY_CARE_PROVIDER_SITE_OTHER): Payer: Self-pay | Admitting: Ophthalmology

## 2021-05-16 DIAGNOSIS — H35352 Cystoid macular degeneration, left eye: Secondary | ICD-10-CM

## 2021-05-16 DIAGNOSIS — D8683 Sarcoid iridocyclitis: Secondary | ICD-10-CM

## 2021-05-16 DIAGNOSIS — H35073 Retinal telangiectasis, bilateral: Secondary | ICD-10-CM | POA: Diagnosis not present

## 2021-05-16 DIAGNOSIS — H35072 Retinal telangiectasis, left eye: Secondary | ICD-10-CM

## 2021-05-16 DIAGNOSIS — D869 Sarcoidosis, unspecified: Secondary | ICD-10-CM

## 2021-05-16 DIAGNOSIS — H35071 Retinal telangiectasis, right eye: Secondary | ICD-10-CM

## 2021-05-16 DIAGNOSIS — H3530 Unspecified macular degeneration: Secondary | ICD-10-CM | POA: Diagnosis not present

## 2021-05-16 DIAGNOSIS — G4733 Obstructive sleep apnea (adult) (pediatric): Secondary | ICD-10-CM | POA: Diagnosis not present

## 2021-05-16 NOTE — Assessment & Plan Note (Signed)
Stable no changes, no active disease

## 2021-05-16 NOTE — Assessment & Plan Note (Signed)
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

## 2021-05-16 NOTE — Assessment & Plan Note (Signed)
Vastly improved CME, likely superimposed upon previous damage macular vasculature from sarcoid vasculitis decades ago.  Improvement secondary to much less nightly hypoxic maculopathy induced from untreated obstructive sleep apnea near the end of 2020.  Improved macular oxygenation now since CPAP reinstituted at that time

## 2021-05-16 NOTE — Assessment & Plan Note (Signed)
Component of retinal telangiectasis, improved and resolved only with the institution of CPAP ,  near the end of 2020

## 2021-05-16 NOTE — Patient Instructions (Signed)
I'm glad you are doing well.  We can continue CPAP auto 5-20  Please call if we can help

## 2021-05-16 NOTE — Progress Notes (Signed)
05/16/2021     CHIEF COMPLAINT Patient presents for Retina Follow Up (6 month fu OU. OCT and FP./Pt states VA OU stable since last visit. Pt denies FOL, floaters, or ocular pain OU. /Pt reports using Combigan BID OU and Azopt BID OU and Travatan QHS OU/A1C:6.3/LBS: 102)   HISTORY OF PRESENT ILLNESS: Eileen Jefferson is a 70 y.o. female who presents to the clinic today for:   HPI    Retina Follow Up    Diagnosis: Other   Laterality: both eyes   Onset: 6 months ago   Severity: mild   Duration: 6 months   Course: stable   Comments: 6 month fu OU. OCT and FP. Pt states VA OU stable since last visit. Pt denies FOL, floaters, or ocular pain OU.  Pt reports using Combigan BID OU and Azopt BID OU and Travatan QHS OU A1C:6.3 LBS: 102       Last edited by Kendra Opitz, COA on 05/16/2021  8:07 AM. (History)      Referring physician: Horald Pollen, MD [place],  [place] [id number]  HISTORICAL INFORMATION:   Selected notes from the MEDICAL RECORD NUMBER    Lab Results  Component Value Date   HGBA1C 6.2 (A) 11/08/2020     CURRENT MEDICATIONS: Current Outpatient Medications (Ophthalmic Drugs)  Medication Sig  . brimonidine-timolol (COMBIGAN) 0.2-0.5 % ophthalmic solution Place 1 drop into the left eye 2 (two) times daily.    . brinzolamide (AZOPT) 1 % ophthalmic suspension Place 1 drop into the left eye 2 (two) times daily.    . Travoprost, BAK Free, (TRAVATAN) 0.004 % SOLN ophthalmic solution 1 drop at bedtime.   No current facility-administered medications for this visit. (Ophthalmic Drugs)   Current Outpatient Medications (Other)  Medication Sig  . Accu-Chek Softclix Lancets lancets Test blood sugar once daily. Dx: E11.9  . Alcohol Swabs PADS Test blood sugar once daily. Dx: E11.9  . allopurinol (ZYLOPRIM) 100 MG tablet Take 0.5 tablets (50 mg total) by mouth daily.  Marland Kitchen aspirin EC 81 MG tablet Take 81 mg by mouth daily.  . Blood Glucose Calibration  (ACCU-CHEK AVIVA) SOLN Test blood sugar once daily. Dx: E11.9  . Blood Glucose Monitoring Suppl (ACCU-CHEK AVIVA PLUS) w/Device KIT Test blood sugar once daily. Dx: E11.9  . colchicine 0.6 MG tablet Take 1.2 mg now and 0.6 mg one hour later. Take 0.6 mg daily after that x 5 days.  . famotidine (PEPCID) 40 MG tablet TAKE 1 TABLET (40 MG TOTAL) BY MOUTH 2  TIMES DAILY AS NEEDED FOR HEARTBURN OR INDIGESTION.  Marland Kitchen glucose blood (ACCU-CHEK AVIVA PLUS) test strip Test blood sugar once daily. Dx: E11.9  . lisinopril-hydrochlorothiazide (ZESTORETIC) 10-12.5 MG tablet Take 1 tablet by mouth daily.  . metFORMIN (GLUCOPHAGE) 500 MG tablet Take 1 tablet (500 mg total) by mouth 2 (two) times daily with a meal.  . omeprazole (PRILOSEC) 40 MG capsule TAKE 1 CAPSULE (40 MG TOTAL) BY MOUTH DAILY. 30-60 MINUTES BEFORE BREAKFAST  . rosuvastatin (CRESTOR) 40 MG tablet Take 1 tablet (40 mg total) by mouth daily.   No current facility-administered medications for this visit. (Other)      REVIEW OF SYSTEMS:    ALLERGIES Allergies  Allergen Reactions  . Contrast Media [Iodinated Diagnostic Agents]   . Iohexol Other (See Comments)    " made me feel like I was burning inside"    PAST MEDICAL HISTORY Past Medical History:  Diagnosis Date  .  DDD (degenerative disc disease), cervical 07/11/2017   C5-6 and C6-7 cervical spondylosis and degenerative disc disease on 2005 Xray  . Diverticulosis   . Glaucoma 04/06/2014  . HTN (hypertension)   . Hyperlipidemia   . 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 Iron deficiency anemia   . Pulmonary sarcoidosis ([place])   . Sleep apnea   . Thyroid disease   . 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 ABDOMINAL HYSTERECTOMY N/A    [employer]reesia 11/14/2020  . APPENDECTOMY    . c-section     x 2  . CATARACT EXTRACTION     bilateral  . CESAREAN SECTION N/A     [employer]reesia 11/14/2020  . EYE SURGERY N/A    [employer]reesia 11/14/2020  . SCLERAL BUCKLE     right  . TONSILLECTOMY    . TOTAL ABDOMINAL HYSTERECTOMY    . TUBAL LIGATION    . VITRECTOMY     bilateral    FAMILY HISTORY Family History  Problem Relation Age of Onset  . Colon polyps Mother        brothers x2  . Clotting disorder Mother        brother x 2, MGM  . Heart disease Brother        mother, MGM  . Diabetes Brother        x 2, MGM  . Stroke Maternal Grandmother   . Colon cancer Neg Hx   . Breast cancer Neg Hx     SOCIAL HISTORY Social History   Tobacco Use  . Smoking status: Former Smoker    Packs/day: 0.20    Years: 1.00    Pack years: 0.20    Types: Cigarettes    Quit date: 12/11/1968    Years since quitting: 52.4  . Smokeless tobacco: Never Used  Substance Use Topics  . Alcohol use: No  . Drug use: No         OPHTHALMIC EXAM: Base Eye Exam    Visual Acuity (ETDRS)      Right Left   Dist cc 20/200 20/70 +2   Dist ph cc NI 20/50       Tonometry (Tonopen, 8:11 AM)      Right Left   Pressure 15 15       Pupils      Dark Light Shape React APD   Right 3 2 Irregular Minimal None   Left 3 2 Round Brisk None       Visual Fields      Left Right   Restrictions  Total superior temporal, inferior temporal, inferior nasal deficiencies       Neuro/Psych    Oriented x3: Yes   Mood/Affect: Normal       Dilation    Both eyes: 1.0% Mydriacyl, 2.5% Phenylephrine @ 8:11 AM        Slit Lamp and Fundus Exam    External Exam      Right Left   External Normal Normal       Slit Lamp Exam      Right Left   Lids/Lashes Normal Normal   Conjunctiva/Sclera White and quiet White and quiet   Cornea Clear Clear   Anterior Chamber Deep and quiet Deep and quiet   Iris Round and reactive Round and reactive   Lens Anterior chamber intraocular lens Posterior chamber intraocular lens   Anterior Vitreous Normal Normal  Fundus Exam      Right Left    Posterior Vitreous Vitrectomized Vitrectomized   Disc 2+ Pallor Pallor 1+   C/D Ratio 0.35 0.6-0.7   Macula Mild epiretinal membrane No obvious CME.   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   Periphery Good PRP, hyperpigmented Good PRP, hyperpigmented          IMAGING AND PROCEDURES  Imaging and Procedures for 05/16/21  OCT, Retina - OU - Both Eyes       Right Eye Quality was good. Central Foveal Thickness: 331. Progression has improved. Findings include cystoid macular edema.   Left Eye Quality was good. Scan locations included subfoveal. Central Foveal Thickness: 287. Progression has improved.   Notes OD with chronic cystoid macular edema temporally, yet less than last office visit dated 11-17-2011  most of it looks atrophic but much less center involved cystoid change as compared to October 2020 prior to restart of of CPAP use  OS, complete resolution of CME temporal to the fovea as well as subfoveal  Serous retinal detachment completely resolved centrally post restart of CPAP use November 2020       Color Fundus Photography Optos - OU - Both Eyes       Right Eye Progression has been stable. Disc findings include increased cup to disc ratio, pallor. Macula : microaneurysms.   Left Eye Disc findings include increased cup to disc ratio, pallor. Macula : microaneurysms.   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  OS similar retinal vasculitis and peripheral retinal nonperfusion treated by peripheral PRP and subsequent resolution of systemic disease of sarcoidosis.  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  No other changes in systemic therapy undertaken.  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  Improvement secondary to much less nightly hypoxic maculopathy induced from untreated obstructive sleep apnea near the end of 2020.  Improved macular oxygenation now since CPAP reinstituted at that time  Cystoid macular edema of left eye Component of retinal telangiectasis, improved and resolved only with the institution of CPAP ,  near the end of 2020      ICD-10-CM   1. Retinal telangiectasia of left eye  H35.072 Color Fundus Photography Optos - OU - Both Eyes  2. Retinal telangiectasia of right eye  H35.071 OCT, Retina - OU - Both Eyes  3. OSA (obstructive sleep apnea)  G47.33   4. 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 Cystoid macular edema of left eye  H35.352     1.  Continued use of CPAP has maintained resolution of perifoveal CME from macular telangiectasis which I believe is induced by sleep associated obstructive sleep apnea  causing nightly hypoxic vascular damage to the foveal avascular zone in the perifoveal region.  That CME has now resolved in the subfoveal serous detachment left eye seen near the end of 2020 completely resolved only with the reinstitution and excellent compliance of use of CPAP in order to prevent nightly hypoxic damage to the macular region  2.  3.  Ophthalmic Meds Ordered this visit:  No orders of the defined types were placed in this encounter.       Return in about 6 weeks (around 06/27/2021) for DILATE OU, OCT.  There are no Patient Instructions on file for this visit.   Explained the diagnoses, plan, and follow up with the patient and they expressed understanding.  Patient expressed understanding of the importance of proper follow up care.   Clent Demark Denisia Harpole M.D. Diseases & Surgery of the Retina and Vitreous [facility] [place] 05/16/21     Abbreviations: M myopia (nearsighted); A astigmatism; H hyperopia (farsighted); P presbyopia; Mrx spectacle prescription;  CTL contact lenses; OD right eye; OS left eye; OU both eyes  XT exotropia; ET esotropia; PEK punctate epithelial keratitis; PEE punctate epithelial erosions; DES dry eye syndrome; MGD meibomian gland dysfunction; ATs artificial tears; PFAT's preservative free artificial tears; [place] nuclear sclerotic cataract; PSC posterior subcapsular cataract; ERM epi-retinal membrane; PVD posterior vitreous detachment; RD retinal detachment; DM diabetes mellitus; DR diabetic retinopathy; NPDR non-proliferative diabetic retinopathy; PDR proliferative diabetic retinopathy; CSME clinically significant macular edema; DME diabetic macular edema; dbh dot blot hemorrhages; CWS cotton wool spot; POAG primary open angle glaucoma; C/D cup-to-disc ratio; HVF humphrey visual field; GVF goldmann visual field; OCT optical coherence tomography; IOP intraocular pressure; BRVO Branch retinal vein occlusion; CRVO central retinal vein occlusion; CRAO central retinal artery occlusion; BRAO branch retinal artery occlusion; RT retinal tear; SB scleral buckle; PPV pars plana vitrectomy; VH Vitreous hemorrhage; PRP panretinal laser photocoagulation; IVK intravitreal kenalog; VMT vitreomacular traction; MH Macular hole;  NVD neovascularization of the disc; NVE neovascularization elsewhere; AREDS age related eye disease study; ARMD age related macular degeneration; POAG primary open angle glaucoma; EBMD  epithelial/anterior basement membrane dystrophy; ACIOL anterior chamber intraocular lens; IOL intraocular lens; PCIOL posterior chamber intraocular lens; Phaco/IOL phacoemulsification with intraocular lens placement; [place] photorefractive keratectomy; LASIK laser assisted in situ keratomileusis; HTN hypertension; DM diabetes mellitus; COPD chronic obstructive pulmonary disease

## 2021-05-16 NOTE — Assessment & Plan Note (Signed)
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  No other changes in systemic therapy undertaken.

## 2021-05-20 ENCOUNTER — Ambulatory Visit
Admission: RE | Admit: 2021-05-20 | Discharge: 2021-05-20 | Disposition: A | Discharge status: Home / Self Care | Payer: Medicare HMO | Source: Ambulatory Visit | Attending: Emergency Medicine | Admitting: Emergency Medicine

## 2021-05-20 ENCOUNTER — Other Ambulatory Visit: Payer: Self-pay

## 2021-05-20 DIAGNOSIS — Z1231 Encounter for screening mammogram for malignant neoplasm of breast: Secondary | ICD-10-CM

## 2021-05-31 ENCOUNTER — Other Ambulatory Visit: Payer: Self-pay

## 2021-05-31 ENCOUNTER — Encounter: Payer: Self-pay | Admitting: Emergency Medicine

## 2021-05-31 ENCOUNTER — Ambulatory Visit (INDEPENDENT_AMBULATORY_CARE_PROVIDER_SITE_OTHER): Payer: Medicare HMO | Admitting: Emergency Medicine

## 2021-05-31 VITALS — BP 138/80 | HR 75 | Temp 98.6°F | Ht 63.0 in | Wt 205.0 lb

## 2021-05-31 DIAGNOSIS — E1159 Type 2 diabetes mellitus with other circulatory complications: Secondary | ICD-10-CM | POA: Diagnosis not present

## 2021-05-31 DIAGNOSIS — E1169 Type 2 diabetes mellitus with other specified complication: Secondary | ICD-10-CM

## 2021-05-31 DIAGNOSIS — G4733 Obstructive sleep apnea (adult) (pediatric): Secondary | ICD-10-CM | POA: Diagnosis not present

## 2021-05-31 DIAGNOSIS — E785 Hyperlipidemia, unspecified: Secondary | ICD-10-CM | POA: Diagnosis not present

## 2021-05-31 DIAGNOSIS — I152 Hypertension secondary to endocrine disorders: Secondary | ICD-10-CM

## 2021-05-31 DIAGNOSIS — Z6838 Body mass index (BMI) 38.0-38.9, adult: Secondary | ICD-10-CM | POA: Diagnosis not present

## 2021-05-31 LAB — COMPREHENSIVE METABOLIC PANEL
ALT: 14 U/L (ref 0–35)
AST: 16 U/L (ref 0–37)
Albumin: 4.1 g/dL (ref 3.5–5.2)
Alkaline Phosphatase: 88 U/L (ref 39–117)
BUN: 22 mg/dL (ref 6–23)
CO2: 30 mEq/L (ref 19–32)
Calcium: 10.4 mg/dL (ref 8.4–10.5)
Chloride: 103 mEq/L (ref 96–112)
Creatinine, Ser: 1.18 mg/dL (ref 0.40–1.20)
GFR: 47.61 mL/min — ABNORMAL LOW (ref >60.00)
Glucose, Bld: 73 mg/dL (ref 70–99)
Potassium: 4 mEq/L (ref 3.5–5.1)
Sodium: 140 mEq/L (ref 135–145)
Total Bilirubin: 0.2 mg/dL (ref 0.2–1.2)
Total Protein: 8.2 g/dL (ref 6.0–8.3)

## 2021-05-31 LAB — LIPID PANEL
Cholesterol: 156 mg/dL (ref 0–200)
HDL: 61.8 mg/dL (ref >39.00)
LDL Cholesterol: 78 mg/dL (ref 0–99)
NonHDL: 93.71
Total CHOL/HDL Ratio: 3
Triglycerides: 80 mg/dL (ref 0.0–149.0)
VLDL: 16 mg/dL (ref 0.0–40.0)

## 2021-05-31 LAB — HEMOGLOBIN A1C: Hgb A1c MFr Bld: 6.6 % — ABNORMAL HIGH (ref 4.6–6.5)

## 2021-05-31 NOTE — Assessment & Plan Note (Signed)
Diet and nutrition discussed.  Lipid profile done today. Continue rosuvastatin 40 mg daily.

## 2021-05-31 NOTE — Assessment & Plan Note (Signed)
Well-controlled hypertension with normal blood pressure readings at home.  Continue Zestoretic 10-12.5 mg daily.  Continue baby aspirin daily. Well-controlled diabetes with normal glucose numbers at home. Continue metformin 500 mg twice a day. Diet and nutrition discussed.

## 2021-05-31 NOTE — Patient Instructions (Signed)
Preventive Care 68 Years and Older, Female Preventive care refers to lifestyle choices and visits with your health care provider that can promote health and wellness. This includes: A yearly physical exam. This is also called an annual wellness visit. Regular dental and eye exams. Immunizations. Screening for certain conditions. Healthy lifestyle choices, such as: Eating a healthy diet. Getting regular exercise. Not using drugs or products that contain nicotine and tobacco. Limiting alcohol use. What can I expect for my preventive care visit? Physical exam Your health care provider will check your: Height and weight. These may be used to calculate your BMI (body mass index). BMI is a measurement that tells if you are at a healthy weight. Heart rate and blood pressure. Body temperature. Skin for abnormal spots. Counseling Your health care provider may ask you questions about your: Past medical problems. Family's medical history. Alcohol, tobacco, and drug use. Emotional well-being. Home life and relationship well-being. Sexual activity. Diet, exercise, and sleep habits. History of falls. Memory and ability to understand (cognition). Work and work Statistician. Pregnancy and menstrual history. Access to firearms. What immunizations do I need?  Vaccines are usually given at various ages, according to a schedule. Your health care provider will recommend vaccines for you based on your age, medicalhistory, and lifestyle or other factors, such as travel or where you work. What tests do I need? Blood tests Lipid and cholesterol levels. These may be checked every 5 years, or more often depending on your overall health. Hepatitis C test. Hepatitis B test. Screening Lung cancer screening. You may have this screening every year starting at age 68 if you have a 30-pack-year history of smoking and currently smoke or have quit within the past 15 years. Colorectal cancer screening. All  adults should have this screening starting at age 68 and continuing until age 65. Your health care provider may recommend screening at age 61 if you are at increased risk. You will have tests every 1-10 years, depending on your results and the type of screening test. Diabetes screening. This is done by checking your blood sugar (glucose) after you have not eaten for a while (fasting). You may have this done every 1-3 years. Mammogram. This may be done every 1-2 years. Talk with your health care provider about how often you should have regular mammograms. Abdominal aortic aneurysm (AAA) screening. You may need this if you are a current or former smoker. BRCA-related cancer screening. This may be done if you have a family history of breast, ovarian, tubal, or peritoneal cancers. Other tests STD (sexually transmitted disease) testing, if you are at risk. Bone density scan. This is done to screen for osteoporosis. You may have this done starting at age 68. Talk with your health care provider about your test results, treatment options,and if necessary, the need for more tests. Follow these instructions at home: Eating and drinking  Eat a diet that includes fresh fruits and vegetables, whole grains, lean protein, and low-fat dairy products. Limit your intake of foods with high amounts of sugar, saturated fats, and salt. Take vitamin and mineral supplements as recommended by your health care provider. Do not drink alcohol if your health care provider tells you not to drink. If you drink alcohol: Limit how much you have to 0-1 drink a day. Be aware of how much alcohol is in your drink. In the [place] one drink equals one 12 oz bottle of beer (355 mL), one 5 oz glass of wine (148 mL), or one 1  oz glass of hard liquor (44 mL).  Lifestyle Take daily care of your teeth and gums. Brush your teeth every morning and night with fluoride toothpaste. Floss one time each day. Stay active. Exercise for at  least 30 minutes 5 or more days each week. Do not use any products that contain nicotine or tobacco, such as cigarettes, e-cigarettes, and chewing tobacco. If you need help quitting, ask your health care provider. Do not use drugs. If you are sexually active, practice safe sex. Use a condom or other form of protection in order to prevent STIs (sexually transmitted infections). Talk with your health care provider about taking a low-dose aspirin or statin. Find healthy ways to cope with stress, such as: Meditation, yoga, or listening to music. Journaling. Talking to a trusted person. Spending time with friends and family. Safety Always wear your seat belt while driving or riding in a vehicle. Do not drive: If you have been drinking alcohol. Do not ride with someone who has been drinking. When you are tired or distracted. While texting. Wear a helmet and other protective equipment during sports activities. If you have firearms in your house, make sure you follow all gun safety procedures. What's next? Visit your health care provider once a year for an annual wellness visit. Ask your health care provider how often you should have your eyes and teeth checked. Stay up to date on all vaccines. This information is not intended to replace advice given to you by your health care provider. Make sure you discuss any questions you have with your healthcare provider. Document Revised: 11/17/2020 Document Reviewed: 11/21/2018 Elsevier Patient Education  2022 [employer].

## 2021-05-31 NOTE — Progress Notes (Signed)
Eileen Jefferson 70 y.o.   Chief Complaint  Patient presents with   Diabetes    Follow up 6 months   Hypertension   Last office visit assessment and plan as follows: ASSESSMENT & PLAN: Hypertension associated with diabetes ([place]) Well-controlled hypertension.  Continue Zestoretic 10-12.5 mg daily. Well-controlled diabetes with hemoglobin A1c of 6.2.  Continue Metformin 500 mg twice daily. HISTORY OF PRESENT ILLNESS: This is a 70 y.o. female with history of hypertension and diabetes here for follow-up. 1.  Diabetes: On metformin 500 mg twice a day.  Morning sugars at home in the 90s and evenings sugars maximum of 140. 2.  Hypertension: On Zestoretic 10-12.5 mg daily.  Normal blood pressure readings at home 3.  Dyslipidemia: On rosuvastatin 40 mg daily. Doing well.  Has no complaints or medical concerns today.   Diabetes Pertinent negatives for hypoglycemia include no dizziness or headaches. Pertinent negatives for diabetes include no chest pain.  Hypertension Pertinent negatives include no chest pain, headaches, palpitations or shortness of breath.    Prior to Admission medications   Medication Sig Start Date End Date Taking? Authorizing Provider  Accu-Chek Softclix Lancets lancets Test blood sugar once daily. Dx: E11.9 04/26/20  Yes Horald Pollen, MD  Alcohol Swabs PADS Test blood sugar once daily. Dx: E11.9 10/25/16  Yes Shawnee Knapp, MD  aspirin EC 81 MG tablet Take 81 mg by mouth daily.   Yes [provider]  Blood Glucose Calibration (ACCU-CHEK AVIVA) SOLN Test blood sugar once daily. Dx: E11.9 10/25/16  Yes Shawnee Knapp, MD  Blood Glucose Monitoring Suppl (ACCU-CHEK AVIVA PLUS) w/Device KIT Test blood sugar once daily. Dx: E11.9 10/25/16  Yes Shawnee Knapp, MD  brimonidine-timolol (COMBIGAN) 0.2-0.5 % ophthalmic solution Place 1 drop into the left eye 2 (two) times daily.   Yes [provider]  brinzolamide (AZOPT) 1 % ophthalmic suspension Place 1  drop into the left eye 2 (two) times daily.   Yes [provider]  colchicine 0.6 MG tablet Take 1.2 mg now and 0.6 mg one hour later. Take 0.6 mg daily after that x 5 days. 04/26/20  Yes Perina Salvaggio, Ines Bloomer, MD  famotidine (PEPCID) 40 MG tablet TAKE 1 TABLET (40 MG TOTAL) BY MOUTH 2  TIMES DAILY AS NEEDED FOR HEARTBURN OR INDIGESTION. 07/10/20  Yes Mattew Chriswell, Ines Bloomer, MD  glucose blood (ACCU-CHEK AVIVA PLUS) test strip Test blood sugar once daily. Dx: E11.9 04/26/20  Yes Horald Pollen, MD  lisinopril-hydrochlorothiazide (ZESTORETIC) 10-12.5 MG tablet Take 1 tablet by mouth daily. 04/26/20  Yes Froilan Mclean, Ines Bloomer, MD  metFORMIN (GLUCOPHAGE) 500 MG tablet Take 1 tablet (500 mg total) by mouth 2 (two) times daily with a meal. 04/26/20  Yes Honore Wipperfurth, Ines Bloomer, MD  omeprazole (PRILOSEC) 40 MG capsule TAKE 1 CAPSULE (40 MG TOTAL) BY MOUTH DAILY. 30-60 MINUTES BEFORE BREAKFAST 01/07/21  Yes Maciah Feeback, Ines Bloomer, MD  rosuvastatin (CRESTOR) 40 MG tablet Take 1 tablet (40 mg total) by mouth daily. 04/26/20  Yes Aldred Mase, Ines Bloomer, MD  Travoprost, BAK Free, (TRAVATAN) 0.004 % SOLN ophthalmic solution 1 drop at bedtime.   Yes [provider]  allopurinol (ZYLOPRIM) 100 MG tablet Take 0.5 tablets (50 mg total) by mouth daily. 05/28/20 08/26/20  Daleen Squibb, MD    Allergies  Allergen Reactions   Contrast Media [Iodinated Diagnostic Agents]    Iohexol Other (See Comments)    " made me feel like I was burning inside"  Patient Active Problem List   Diagnosis Date Noted   Stage 3a chronic kidney disease ([place]) 11/08/2020   Retinal telangiectasia of right eye 05/18/2020   Retinal telangiectasia of left eye 05/18/2020   Cystoid macular edema of left eye 05/18/2020   Hyperuricemia 04/27/2020   Gastroesophageal reflux disease 11/23/2018   DDD (degenerative disc disease), cervical 07/11/2017   Localized osteoarthrosis of right shoulder region 07/11/2017   Anemia  07/11/2017   Hyperlipidemia 06/28/2016   Thyroid disease 06/28/2016   Diabetic retinopathy ([place]) 05/05/2015   Class 2 severe obesity due to excess calories with serious comorbidity and body mass index (BMI) of 38.0 to 38.9 in adult ([place]) 01/11/2015   Glaucoma 04/06/2014   Macular degeneration 04/06/2014   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 ([place])     Past Surgical History:  Procedure Laterality Date   ABDOMINAL HYSTERECTOMY N/A    [facility] 11/14/2020   APPENDECTOMY     c-section     x 2   CATARACT EXTRACTION     bilateral   CESAREAN SECTION N/A    [facility] 11/14/2020   EYE SURGERY N/A    [facility] 11/14/2020   SCLERAL BUCKLE     right   TONSILLECTOMY     TOTAL ABDOMINAL HYSTERECTOMY     TUBAL LIGATION     VITRECTOMY     bilateral    Social History   Socioeconomic History   Marital status: Married    Spouse name: Not on file   Number of children: 2   Years of education: Not on file   Highest education level: Not on file  Occupational History   Occupation: behavioral health  Tobacco Use   Smoking status: Former    Packs/day: 0.20    Years: 1.00    Pack years: 0.20    Types: Cigarettes    Quit date: 12/11/1968    Years since quitting: 52.5   Smokeless tobacco: Never   Substance and Sexual Activity   Alcohol use: No   Drug use: No   Sexual activity: Not on file  Other Topics Concern   Not on file  Social History Narrative   Not on file   Social Determinants of Health   Financial Resource Strain: Not on file  Food Insecurity: Not on file  Transportation Needs: Not on file  Physical Activity: Not on file  Stress: Not on file  Social Connections: Not on file  Intimate Partner Violence: Not on file    Family History  Problem Relation Age of Onset   Colon polyps Mother        brothers x2   Clotting disorder Mother        brother x 2, MGM   Heart disease Brother        mother, [place]  Diabetes Brother        x 2, MGM   Stroke Maternal Grandmother    Colon cancer Neg Hx    Breast cancer Neg Hx      Review of Systems  Constitutional: Negative.  Negative for chills and fever.  HENT: Negative.  Negative for congestion and sore throat.   Respiratory: Negative.  Negative for cough and shortness of breath.   Cardiovascular: Negative.  Negative for chest pain and palpitations.  Gastrointestinal:  Negative for abdominal pain, diarrhea, nausea and vomiting.  Genitourinary: Negative.  Negative for dysuria and hematuria.  Skin: Negative.  Negative for rash.  Neurological:  Negative for dizziness and headaches.  All other systems reviewed and are negative.  Today's Vitals   05/31/21 1321  BP: (!) 154/80  Pulse: 75  Temp: 98.6 F (37 C)  TempSrc: Oral  SpO2: 94%  Weight: 205 lb (93 kg)  Height: 5' 3"  (1.6 m)   Body mass index is 36.31 kg/m. Wt Readings from Last 3 Encounters:  05/31/21 205 lb (93 kg)  05/16/21 207 lb (93.9 kg)  11/15/20 208 lb (94.3 kg)    Physical Exam Vitals reviewed.  Constitutional:      Appearance: Normal appearance.  HENT:     Head: Normocephalic.  Eyes:     Extraocular Movements: Extraocular movements intact.     Conjunctiva/sclera: Conjunctivae normal.     Pupils: Pupils are equal, round, and reactive  to light.  Cardiovascular:     Rate and Rhythm: Normal rate and regular rhythm.  Pulmonary:     Effort: Pulmonary effort is normal.     Breath sounds: Normal breath sounds.  Abdominal:     Palpations: Abdomen is soft.     Tenderness: There is no abdominal tenderness.  Musculoskeletal:        General: Normal range of motion.     Cervical back: Normal range of motion and neck supple.     Right lower leg: No edema.     Left lower leg: No edema.  Skin:    General: Skin is warm and dry.     Capillary Refill: Capillary refill takes less than 2 seconds.  Neurological:     General: No focal deficit present.     Mental Status: She is alert and oriented to person, place, and time.  Psychiatric:        Mood and Affect: Mood normal.        Behavior: Behavior normal.     ASSESSMENT & PLAN: A total of 30 minutes was spent with the patient and counseling/coordination of care regarding hypertension and diabetes and cardiovascular risks associated with these conditions and how to avoid them, review of all medications, review of most recent office visit notes, review of most recent blood work results, health maintenance items, education on nutrition, prognosis, documentation and need for follow-up.  Hypertension associated with diabetes ([place]) Well-controlled hypertension with normal blood pressure readings at home.  Continue Zestoretic 10-12.5 mg daily.  Continue baby aspirin daily. Well-controlled diabetes with normal glucose numbers at home. Continue metformin 500 mg twice a day. Diet and nutrition discussed.  Dyslipidemia associated with type 2 diabetes mellitus ([place]) Diet and nutrition discussed.  Lipid profile done today. Continue rosuvastatin 40 mg daily. [place] was seen today for diabetes and hypertension.  Diagnoses and all orders for this visit:  Hypertension associated with diabetes ([place]) -     Comprehensive metabolic panel -     Hemoglobin A1c  Dyslipidemia associated  with  type 2 diabetes mellitus (HCC) -     Lipid panel  Class 2 severe obesity due to excess calories with serious comorbidity and body mass index (BMI) of 38.0 to 38.9 in adult [facility])  Patient Instructions  Preventive Care 65 Years and Older, Female Preventive care refers to lifestyle choices and visits with your health care provider that can promote health and wellness. This includes: A yearly physical exam. This is also called an annual wellness visit. Regular dental and eye exams. Immunizations. Screening for certain conditions. Healthy lifestyle choices, such as: Eating a healthy diet. Getting regular exercise. Not using drugs or products that contain nicotine and tobacco. Limiting alcohol use. What can I expect for my preventive care visit? Physical exam Your health care provider will check your: Height and weight. These may be used to calculate your BMI (body mass index). BMI is a measurement that tells if you are at a healthy weight. Heart rate and blood pressure. Body temperature. Skin for abnormal spots. Counseling Your health care provider may ask you questions about your: Past medical problems. Family's medical history. Alcohol, tobacco, and drug use. Emotional well-being. Home life and relationship well-being. Sexual activity. Diet, exercise, and sleep habits. History of falls. Memory and ability to understand (cognition). Work and work Statistician. Pregnancy and menstrual history. Access to firearms. What immunizations do I need?  Vaccines are usually given at various ages, according to a schedule. Your health care provider will recommend vaccines for you based on your age, medicalhistory, and lifestyle or other factors, such as travel or where you work. What tests do I need? Blood tests Lipid and cholesterol levels. These may be checked every 5 years, or more often depending on your overall health. Hepatitis C test. Hepatitis B test. Screening Lung cancer  screening. You may have this screening every year starting at age 52 if you have a 30-pack-year history of smoking and currently smoke or have quit within the past 15 years. Colorectal cancer screening. All adults should have this screening starting at age 36 and continuing until age 51. Your health care provider may recommend screening at age 55 if you are at increased risk. You will have tests every 1-10 years, depending on your results and the type of screening test. Diabetes screening. This is done by checking your blood sugar (glucose) after you have not eaten for a while (fasting). You may have this done every 1-3 years. Mammogram. This may be done every 1-2 years. Talk with your health care provider about how often you should have regular mammograms. Abdominal aortic aneurysm (AAA) screening. You may need this if you are a current or former smoker. BRCA-related cancer screening. This may be done if you have a family history of breast, ovarian, tubal, or peritoneal cancers. Other tests STD (sexually transmitted disease) testing, if you are at risk. Bone density scan. This is done to screen for osteoporosis. You may have this done starting at age 64. Talk with your health care provider about your test results, treatment options,and if necessary, the need for more tests. Follow these instructions at home: Eating and drinking  Eat a diet that includes fresh fruits and vegetables, whole grains, lean protein, and low-fat dairy products. Limit your intake of foods with high amounts of sugar, saturated fats, and salt. Take vitamin and mineral supplements as recommended by your health care provider. Do not drink alcohol if your health care provider tells you not to drink. If you drink alcohol: Limit how much  you have to 0-1 drink a day. Be aware of how much alcohol is in your drink. In the [place] one drink equals one 12 oz bottle of beer (355 mL), one 5 oz glass of wine (148 mL), or one 1 oz  glass of hard liquor (44 mL).  Lifestyle Take daily care of your teeth and gums. Brush your teeth every morning and night with fluoride toothpaste. Floss one time each day. Stay active. Exercise for at least 30 minutes 5 or more days each week. Do not use any products that contain nicotine or tobacco, such as cigarettes, e-cigarettes, and chewing tobacco. If you need help quitting, ask your health care provider. Do not use drugs. If you are sexually active, practice safe sex. Use a condom or other form of protection in order to prevent STIs (sexually transmitted infections). Talk with your health care provider about taking a low-dose aspirin or statin. Find healthy ways to cope with stress, such as: Meditation, yoga, or listening to music. Journaling. Talking to a trusted person. Spending time with friends and family. Safety Always wear your seat belt while driving or riding in a vehicle. Do not drive: If you have been drinking alcohol. Do not ride with someone who has been drinking. When you are tired or distracted. While texting. Wear a helmet and other protective equipment during sports activities. If you have firearms in your house, make sure you follow all gun safety procedures. What's next? Visit your health care provider once a year for an annual wellness visit. Ask your health care provider how often you should have your eyes and teeth checked. Stay up to date on all vaccines. This information is not intended to replace advice given to you by your health care provider. Make sure you discuss any questions you have with your healthcare provider. Document Revised: 11/17/2020 Document Reviewed: 11/21/2018 Elsevier Patient Education  2022 [place], MD [facility]

## 2021-06-02 NOTE — Progress Notes (Signed)
Call patient please.  Blood work shows the following: Well-controlled diabetes.  Continue present medications. Decreased GFR, renal function test, lower than before. Stay well-hydrated and avoid NSAIDs such as Aleve, Advil, Motrin, ibuprofen.  Tylenol is okay for occasional pains. Continue all other medications and follow-up as scheduled. Pay attention to nutrition and decrease amount of daily carbohydrate intake.  Thanks.

## 2021-06-13 ENCOUNTER — Other Ambulatory Visit: Payer: Self-pay | Admitting: Emergency Medicine

## 2021-06-13 DIAGNOSIS — E790 Hyperuricemia without signs of inflammatory arthritis and tophaceous disease: Secondary | ICD-10-CM

## 2021-06-27 ENCOUNTER — Encounter (INDEPENDENT_AMBULATORY_CARE_PROVIDER_SITE_OTHER): Payer: Medicare HMO | Admitting: Ophthalmology

## 2021-07-12 ENCOUNTER — Telehealth: Payer: Self-pay | Admitting: Emergency Medicine

## 2021-07-12 DIAGNOSIS — E790 Hyperuricemia without signs of inflammatory arthritis and tophaceous disease: Secondary | ICD-10-CM

## 2021-07-12 DIAGNOSIS — E1159 Type 2 diabetes mellitus with other circulatory complications: Secondary | ICD-10-CM

## 2021-07-12 NOTE — Telephone Encounter (Signed)
Please send 15 day supply of [facility]urinol to local [facility]  Send 90 day supply of  lisinopril-hydrochlorothiazide (ZESTORETIC) 10-12.5 MG tablet  allopurinol (ZYLOPRIM) 100 MG tablet TO : [facility] mail order

## 2021-07-13 MED ORDER — ALLOPURINOL 100 MG PO TABS: 50.0000 mg | ORAL_TABLET | Freq: Every day | ORAL | 0 refills | Status: DC

## 2021-07-13 NOTE — Telephone Encounter (Signed)
Prescriptions have been done and patient notified.

## 2021-07-13 NOTE — Telephone Encounter (Signed)
Patient calling back in to confirm if short fill had been sent to [facility] yet for her BP med.  Advice dr was still working on it  Please followup w/ patient [phone]

## 2021-07-14 MED ORDER — LISINOPRIL-HYDROCHLOROTHIAZIDE 10-12.5 MG PO TABS
1.0000 | ORAL_TABLET | Freq: Every day | ORAL | 0 refills | Status: DC
Start: 2021-07-14 — End: 2021-08-10

## 2021-07-14 NOTE — Addendum Note (Signed)
Addended by: Zenovia Jordan A on: 07/14/2021 11:33 AM   Modules accepted: Orders

## 2021-08-10 ENCOUNTER — Other Ambulatory Visit: Payer: Self-pay | Admitting: Emergency Medicine

## 2021-08-10 DIAGNOSIS — I152 Hypertension secondary to endocrine disorders: Secondary | ICD-10-CM

## 2021-09-01 DIAGNOSIS — G4733 Obstructive sleep apnea (adult) (pediatric): Secondary | ICD-10-CM | POA: Diagnosis not present

## 2021-09-06 ENCOUNTER — Encounter: Payer: Self-pay | Admitting: Internal Medicine

## 2021-09-23 ENCOUNTER — Ambulatory Visit (INDEPENDENT_AMBULATORY_CARE_PROVIDER_SITE_OTHER): Payer: Medicare HMO | Admitting: Internal Medicine

## 2021-09-23 ENCOUNTER — Other Ambulatory Visit: Payer: Self-pay

## 2021-09-23 ENCOUNTER — Encounter: Payer: Self-pay | Admitting: Internal Medicine

## 2021-09-23 VITALS — BP 152/76 | HR 80 | Temp 98.6°F | Ht 63.0 in | Wt 206.0 lb

## 2021-09-23 DIAGNOSIS — E1165 Type 2 diabetes mellitus with hyperglycemia: Secondary | ICD-10-CM | POA: Diagnosis not present

## 2021-09-23 DIAGNOSIS — M7661 Achilles tendinitis, right leg: Secondary | ICD-10-CM

## 2021-09-23 DIAGNOSIS — I152 Hypertension secondary to endocrine disorders: Secondary | ICD-10-CM | POA: Diagnosis not present

## 2021-09-23 DIAGNOSIS — E1159 Type 2 diabetes mellitus with other circulatory complications: Secondary | ICD-10-CM | POA: Diagnosis not present

## 2021-09-23 DIAGNOSIS — N1831 Chronic kidney disease, stage 3a: Secondary | ICD-10-CM | POA: Diagnosis not present

## 2021-09-23 DIAGNOSIS — M7660 Achilles tendinitis, unspecified leg: Secondary | ICD-10-CM | POA: Insufficient documentation

## 2021-09-23 MED ORDER — PREDNISONE 10 MG PO TABS: ORAL_TABLET | ORAL | 0 refills | Status: DC

## 2021-09-23 NOTE — Progress Notes (Signed)
Patient ID: Eileen Jefferson, female   DOB: 08-29-1951, 70 y.o.   MRN: [id number]        Chief Complaint: follow up right heel pain, htn, dm       HPI:  Eileen Jefferson is a 70 y.o. female here with c/o pain and swelling to right post heel, more lateral than medial, moderate, with swelling x 1 wk, worse to walk, better to sit, nothing else makes better or worse.  No trauma, fever, or recent gout.  Pt denies chest pain, increased sob or doe, wheezing, orthopnea, PND, increased LE swelling, palpitations, dizziness or syncope   Pt denies polydipsia, polyuria, or new focal neuro s/s.          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   Iron deficiency anemia    Pulmonary sarcoidosis (HCC)    Sleep apnea    Thyroid disease    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 Her smoking use included cigarettes. She has a 0.20 pack-year smoking history. She has never used smokeless tobacco. She reports that she does not  drink alcohol and does not use drugs. family history includes Clotting disorder in her mother; Colon polyps in her mother; Diabetes in her brother; Heart disease in her brother; Stroke in her maternal grandmother. Allergies  Allergen Reactions   Contrast Media [Iodinated Diagnostic Agents]    Iohexol Other (See Comments)    " made me feel like I was burning inside"   Current Outpatient Medications on File Prior to Visit  Medication Sig Dispense Refill   Accu-Chek Softclix Lancets lancets Test blood sugar once daily. Dx: E11.9 100 each 2   Alcohol Swabs PADS Test blood sugar once daily. Dx: E11.9 100 each 2   aspirin EC 81 MG tablet Take 81 mg by mouth daily.     Blood Glucose Calibration (ACCU-CHEK AVIVA) SOLN Test blood sugar once daily. Dx: E11.9 1 each 2   Blood Glucose Monitoring Suppl (ACCU-CHEK AVIVA PLUS) w/Device KIT Test blood sugar once daily. Dx: E11.9 1 kit 0   brimonidine-timolol (COMBIGAN) 0.2-0.5 % ophthalmic solution Place 1 drop into the left eye 2 (two) times daily.     brinzolamide (AZOPT) 1 % ophthalmic suspension Place 1 drop into the left eye  2 (two) times daily.     colchicine 0.6 MG tablet Take 1.2 mg now and 0.6 mg one hour later. Take 0.6 mg daily after that x 5 days. 20 tablet 1   famotidine (PEPCID) 40 MG tablet TAKE 1 TABLET (40 MG TOTAL) BY MOUTH 2  TIMES DAILY AS NEEDED FOR HEARTBURN OR INDIGESTION. 180 tablet 2   glucose blood (ACCU-CHEK AVIVA PLUS) test strip Test blood sugar once daily. Dx: E11.9 100 each 2   lisinopril-hydrochlorothiazide (ZESTORETIC) 10-12.5 MG tablet Take 1 tablet by mouth daily. 90 tablet 3   metFORMIN (GLUCOPHAGE) 500 MG tablet Take 1 tablet (500 mg total) by mouth 2 (two) times daily with a meal. 180 tablet 3   omeprazole (PRILOSEC) 40 MG capsule TAKE 1 CAPSULE (40 MG TOTAL) BY MOUTH DAILY. 30-60 MINUTES BEFORE BREAKFAST 90 capsule 1   rosuvastatin (CRESTOR) 40 MG tablet Take 1 tablet (40 mg total) by mouth daily. 90 tablet 3    Travoprost, BAK Free, (TRAVATAN) 0.004 % SOLN ophthalmic solution 1 drop at bedtime.     allopurinol (ZYLOPRIM) 100 MG tablet Take 0.5 tablets (50 mg total) by mouth daily. 15 tablet 0   No current facility-administered medications on file prior to visit.        ROS:  All others reviewed and negative.  Objective        PE:  BP (!) 152/76 (BP Location: Right Arm, Patient Position: Sitting, Cuff Size: Large)   Pulse 80   Temp 98.6 F (37 C) (Oral)   Ht 5' 3"  (1.6 m)   Wt 206 lb (93.4 kg)   SpO2 99%   BMI 36.49 kg/m                 Constitutional: Pt appears in NAD               HENT: Head: NCAT.                Right Ear: External ear normal.                 Left Ear: External ear normal.                Eyes: . Pupils are equal, round, and reactive to light. Conjunctivae and EOM are normal               Nose: without d/c or deformity               Neck: Neck supple. Gross normal ROM               Cardiovascular: Normal rate and regular rhythm.                 Pulmonary/Chest: Effort normal and breath sounds without rales or wheezing.                Abd:  Soft, NT, ND, + BS, no organomegaly               Neurological: Pt is alert. At baseline orientation, motor grossly intact               Skin: Skin is warm. No rashes, no other new lesions, LE edema - none, but right post heel with mod lateral aspect achilles insertion site tender swelling               Psychiatric: Pt behavior is normal without agitation   Micro: none  Cardiac tracings I have personally interpreted  today:  none  Pertinent Radiological findings (summarize): none   Lab Results  Component Value Date   WBC 7.4 04/26/2020   HGB 10.5 (L) 04/26/2020   HCT 35.3 04/26/2020   PLT 348 04/26/2020   GLUCOSE 73 05/31/2021   CHOL 156 05/31/2021   TRIG 80.0 05/31/2021   HDL 61.80 05/31/2021   LDLCALC 78 05/31/2021   ALT 14 05/31/2021   AST 16 05/31/2021   NA 140 05/31/2021   K 4.0 05/31/2021   CL 103 05/31/2021    CREATININE 1.18 05/31/2021   BUN 22 05/31/2021   CO2 30 05/31/2021   TSH 1.220 06/04/2019   HGBA1C 6.6 (H) 05/31/2021   MICROALBUR 0.6 01/11/2015   Assessment/Plan:  Eileen Jefferson is a 70 y.o. 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).  Achilles tendonitis Moderate, d/w pt, for oral prednisone, refer sport medicine with ? Boot and/or PT, but not likely cortisone.  Hypertension associated with diabetes ([place]) BP Readings from Last 3 Encounters:  09/23/21 (!) 152/76  05/31/21 138/80  05/16/21 138/72   Uncontrolled, today, likely situational, pt to continue medical treatment zestoretic   Diabetes ([place]) Lab Results  Component Value Date   HGBA1C 6.6 (H) 05/31/2021   Stable, pt to continue current medical treatment metformin   Stage 3a chronic kidney disease ([place]) Lab Results  Component Value Date   CREATININE 1.18 05/31/2021   Stable overall, cont to avoid nephrotoxins  Followup: No follow-ups on file.  Cathlean Cower, MD 09/25/2021 6:34 PM [facility]

## 2021-09-23 NOTE — Patient Instructions (Signed)
Please take all new medication as prescribed - the prednisone  You will be contacted regarding the referral for: Sports Medicine  Please continue all other medications as before, and refills have been done if requested.  Please have the pharmacy call with any other refills you may need.  Please keep your appointments with your specialists as you may have planned

## 2021-09-25 DIAGNOSIS — E119 Type 2 diabetes mellitus without complications: Secondary | ICD-10-CM | POA: Insufficient documentation

## 2021-09-25 NOTE — Assessment & Plan Note (Signed)
Lab Results  Component Value Date   HGBA1C 6.6 (H) 05/31/2021   Stable, pt to continue current medical treatment metformin

## 2021-09-25 NOTE — Assessment & Plan Note (Addendum)
Moderate, d/w pt, for oral prednisone, refer sport medicine with ? Boot and/or PT, but not likely cortisone.

## 2021-09-25 NOTE — Assessment & Plan Note (Signed)
BP Readings from Last 3 Encounters:  09/23/21 (!) 152/76  05/31/21 138/80  05/16/21 138/72   Uncontrolled, today, likely situational, pt to continue medical treatment zestoretic

## 2021-09-25 NOTE — Assessment & Plan Note (Signed)
Lab Results  Component Value Date   CREATININE 1.18 05/31/2021   Stable overall, cont to avoid nephrotoxins

## 2021-09-26 NOTE — Progress Notes (Signed)
   I, Christoper Fabian, LAT, ATC, am serving as scribe for Dr. Clementeen Graham.  Subjective:    CC: R heel pain  HPI: Pt is a 70 y/o female w/ c/o R post heel pain x approximately 2 weeks w no known MOI. Pt locates pain to her R post-lateral calcaneus w/ some radiating pain into her L Achille's.  She was seen by her PCP for these c/o on 09/23/21 and was prescribed an oral prednisone dose pack.  This has helped a little.  Swelling: Yes Aggravates: walking/weight-bearing activity;  Treatments tried: prednisone dose pack; Tylenol  Pertinent review of Systems: No fevers or chills  Relevant historical information: Diabetes   Objective:    Vitals:   09/27/21 1103  BP: 138/76  Pulse: 72  SpO2: 94%   General: Well Developed, well nourished, and in no acute distress.   MSK: Right calcaneus slightly swollen posterior lateral calcaneus. Tender palpation posterior lateral calcaneus  at Achilles tendon insertion. Normal foot and ankle motion pain with resisted foot plantarflexion.  No pain with resisted foot eversion. Pulses cap refill and sensation are intact distally.  Lab and Radiology Results  Diagnostic Limited MSK Ultrasound of: Right Achilles tendon insertion Achilles tendon normal-appearing until insertion.  Insertion heel spur visible with area of calcification.  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  No acute fractures. Await formal radiology review   Impression and Recommendations:    Assessment and Plan: 70 y.o. female with insertional Achilles tendinitis with calcific change right foot.  Plan to treat with heel lift eccentric  exercises Voltaren gel and temporary use of cam walker boot as needed.  Recheck in 3 weeks.  If not improved would consider targeted injection at area of hypoechoic change just lateral to the Achilles tendon.Marland Kitchen  PDMP not reviewed this encounter. Orders Placed This Encounter  Procedures   [place] LIMITED JOINT SPACE STRUCTURES LOW RIGHT(NO LINKED CHARGES)    Standing Status:   Future    Number of Occurrences:   1    Standing Expiration Date:   03/27/2022    Order Specific Question:   Reason for Exam (SYMPTOM  OR DIAGNOSIS REQUIRED)    Answer:   right achilles pain    Order Specific Question:   Preferred imaging location?    Answer:   [facility]    Standing Status:   Future    Number of Occurrences:   1    Standing Expiration Date:   10/28/2021    Order Specific Question:   Reason for Exam (SYMPTOM  OR DIAGNOSIS REQUIRED)    Answer:   R heel pain    Order Specific Question:   Preferred imaging location?    Answer:   [facility]   No orders of the defined types were placed in this encounter.   Discussed warning signs or symptoms. Please see discharge instructions. Patient expresses understanding.   The above documentation has been reviewed and is accurate and complete Clementeen Graham, M.D.

## 2021-09-27 ENCOUNTER — Ambulatory Visit: Payer: Self-pay

## 2021-09-27 ENCOUNTER — Encounter: Payer: Self-pay | Admitting: Family Medicine

## 2021-09-27 ENCOUNTER — Ambulatory Visit (INDEPENDENT_AMBULATORY_CARE_PROVIDER_SITE_OTHER): Payer: Medicare HMO

## 2021-09-27 ENCOUNTER — Other Ambulatory Visit: Payer: Self-pay

## 2021-09-27 ENCOUNTER — Ambulatory Visit: Payer: Medicare HMO | Admitting: Family Medicine

## 2021-09-27 VITALS — BP 138/76 | HR 72 | Ht 63.0 in | Wt 208.8 lb

## 2021-09-27 DIAGNOSIS — M79671 Pain in right foot: Secondary | ICD-10-CM

## 2021-09-27 DIAGNOSIS — M7661 Achilles tendinitis, right leg: Secondary | ICD-10-CM

## 2021-09-27 DIAGNOSIS — M7731 Calcaneal spur, right foot: Secondary | ICD-10-CM | POA: Diagnosis not present

## 2021-09-27 NOTE — Patient Instructions (Addendum)
Nice to meet you today.  Please perform the exercise program that we have prepared for you and gone over in detail on a daily basis.  In addition to the handout you were provided you can access your program through: [email or web address]   Your unique program code is:   [email or web address]  Please go to [facility] for a walking boot.  Please get an Xray today before you leave.  Follow-up in 3 weeks.

## 2021-09-28 NOTE — Progress Notes (Signed)
Heel x-ray prominent heel spur at the back of the heel.  Additionally on the bottom of the heel it looks like you may have injured it the past but that is not where you are hurting now.

## 2021-10-09 ENCOUNTER — Encounter: Payer: Self-pay | Admitting: Internal Medicine

## 2021-10-09 NOTE — Assessment & Plan Note (Signed)
Doubt systemically active, but may still be local disease.

## 2021-10-09 NOTE — Assessment & Plan Note (Signed)
Benefits from CPAP with good compliance and control Plan- continue auto 5-20 

## 2021-10-09 NOTE — Assessment & Plan Note (Signed)
Being followed by Ophthalmology

## 2021-10-17 NOTE — Progress Notes (Signed)
I, Christoper Fabian, LAT, ATC, am serving as scribe for Dr. Clementeen Graham.  Eileen Jefferson is a 70 y.o. female who presents to [facility] at [facility] today for f/u of R Achille's and post-lat calcaneus pain.  She was last seen by Dr. Denyse Amass on 09/27/21 and was shown Alfredson's exercises and advised to purchase a walking boot.  Today, pt reports her heel feels a little bit better, but she's having pain in her R foot. Pt locates pain to across the heads of the MT and along the distal gastroc. Pt reports slight swelling over forefoot. Pt notes hx of prior gout flares.  Diagnostic imaging: R heel XR- 09/27/21  Pertinent review of systems: No fevers or chills  Relevant historical information: History of gout   Exam:  BP 128/76   Pulse (!) 101   Ht 5\' 3"  (1.6 m)   Wt 208 lb 9.6 oz (94.6 kg)   SpO2 95%   BMI 36.95 kg/m  General: Well Developed, well nourished, and in no acute distress.   MSK: Right foot Mild swelling forefoot.  Mildly tender palpation first second and third MCP. Posterior calcaneus minimally tender.  Normal foot and ankle motion.    Lab and Radiology Results  EXAM: RIGHT OS CALCIS - 2+ VIEW   COMPARISON:  No recent prior.   FINDINGS: Prominent calcaneal spurring. Cortical thickening and irregularity noted along the plantar aspect of the calcaneus. This may be from prior injury. No acute bony abnormality identified. No evidence of fracture or dislocation. No radiopaque foreign body.   IMPRESSION: 1. Prominent calcaneal spurring. Cortical thickening and irregularity noted along the plantar aspect of the calcaneus, possibly from prior injury.   2.  No acute bony abnormality.  No radiopaque foreign body.     Electronically Signed   By:  Register M.D.   On: 09/28/2021 09:30 I, 09/30/2021, personally (independently) visualized and performed the interpretation of the images attached in this note.  Lab Results  Component Value  Date   LABURIC 9.1 (H) 04/26/2020     Chemistry      Component Value Date/Time   NA 140 05/31/2021 1402   NA 142 11/08/2020 1550   K 4.0 05/31/2021 1402   CL 103 05/31/2021 1402   CO2 30 05/31/2021 1402   BUN 22 05/31/2021 1402   BUN 18 11/08/2020 1550   CREATININE 1.18 05/31/2021 1402   CREATININE 1.11 (H) 06/29/2016 0816      Component Value Date/Time   CALCIUM 10.4 05/31/2021 1402   ALKPHOS 88 05/31/2021 1402   AST 16 05/31/2021 1402   ALT 14 05/31/2021 1402   BILITOT 0.2 05/31/2021 1402   BILITOT <0.2 11/08/2020 1550          Assessment and Plan: 70 y.o. female with right foot and ankle pain.  Patient was seen initially about 3 weeks ago for insertional Achilles tendinitis and is improving with home exercise program, and CAM Walker boot.  Plan to continue home exercise program and try to wean out of the cam walker boot if able.  Today she is having some forefoot pain which could be due to gout.  She notes the pain is similar to previous episodes of gout.  Uric acid was last checked in May 2021 and was elevated at 9.1.  Currently she is taking allopurinol 50 mg daily with a GFR of about 45 when last checked in June of this year.  We will go ahead and reassess  both the metabolic panel and the uric acid and determine need for adjusting allopurinol.  We will prescribe colchicine again.  There is a drug interaction between colchicine and her rosuvastatin.  We use lower doses of colchicine based on her renal function and the drug interaction.  Recommend only 1 pill/day as needed.  Recheck again in 3 weeks.   PDMP not reviewed this encounter. Orders Placed This Encounter  Procedures   Uric acid    Standing Status:   Future    Number of Occurrences:   1    Standing Expiration Date:   10/18/2022   Basic metabolic panel    Standing Status:   Future    Number of Occurrences:   1    Standing Expiration Date:   10/18/2022   Meds ordered this encounter  Medications    colchicine 0.6 MG tablet    Sig: Take 1 tablet (0.6 mg total) by mouth daily as needed (gout or psuedogout pain).    Dispense:  30 tablet    Refill:  2     Discussed warning signs or symptoms. Please see discharge instructions. Patient expresses understanding.   The above documentation has been reviewed and is accurate and complete Clementeen Graham, M.D.  Total encounter time 30 minutes including face-to-face time with the patient and, reviewing past medical record, and charting on the date of service.   Discussion treatment plan and option and drug dosing safety.

## 2021-10-18 ENCOUNTER — Ambulatory Visit (INDEPENDENT_AMBULATORY_CARE_PROVIDER_SITE_OTHER): Payer: Medicare HMO | Admitting: Family Medicine

## 2021-10-18 ENCOUNTER — Other Ambulatory Visit: Payer: Self-pay

## 2021-10-18 VITALS — BP 128/76 | HR 101 | Ht 63.0 in | Wt 208.6 lb

## 2021-10-18 DIAGNOSIS — M1A3710 Chronic gout due to renal impairment, right ankle and foot, without tophus (tophi): Secondary | ICD-10-CM | POA: Diagnosis not present

## 2021-10-18 DIAGNOSIS — N1831 Chronic kidney disease, stage 3a: Secondary | ICD-10-CM

## 2021-10-18 DIAGNOSIS — E790 Hyperuricemia without signs of inflammatory arthritis and tophaceous disease: Secondary | ICD-10-CM | POA: Diagnosis not present

## 2021-10-18 LAB — BASIC METABOLIC PANEL
BUN: 22 mg/dL (ref 6–23)
CO2: 31 mEq/L (ref 19–32)
Calcium: 10.1 mg/dL (ref 8.4–10.5)
Chloride: 100 mEq/L (ref 96–112)
Creatinine, Ser: 1.23 mg/dL — ABNORMAL HIGH (ref 0.40–1.20)
GFR: 45.17 mL/min — ABNORMAL LOW (ref >60.00)
Glucose, Bld: 132 mg/dL — ABNORMAL HIGH (ref 70–99)
Potassium: 3.4 mEq/L — ABNORMAL LOW (ref 3.5–5.1)
Sodium: 138 mEq/L (ref 135–145)

## 2021-10-18 LAB — URIC ACID: Uric Acid, Serum: 7.5 mg/dL — ABNORMAL HIGH (ref 2.4–7.0)

## 2021-10-18 MED ORDER — COLCHICINE 0.6 MG PO TABS: 0.6000 mg | ORAL_TABLET | Freq: Every day | ORAL | 2 refills | Status: AC | PRN

## 2021-10-18 NOTE — Patient Instructions (Addendum)
Thank you for coming in today.   Please get labs today before you leave   Continue working on the home exercises  Recheck back in 2 weeks.

## 2021-10-19 MED ORDER — ALLOPURINOL 100 MG PO TABS: 100.0000 mg | ORAL_TABLET | Freq: Every day | ORAL | 0 refills | Status: DC

## 2021-10-19 NOTE — Addendum Note (Signed)
Addended by: Rodolph Bong on: 10/19/2021 07:07 AM   Modules accepted: Orders

## 2021-10-19 NOTE — Progress Notes (Signed)
Uric acid level is elevated.  I have increased allopurinol from a half a pill daily to a full pill daily.  Medicine sent to the [facility] on [address]

## 2021-10-20 ENCOUNTER — Encounter: Payer: Self-pay | Admitting: Internal Medicine

## 2021-11-07 NOTE — Progress Notes (Signed)
   I, Christoper Fabian, LAT, ATC, am serving as scribe for Dr. Clementeen Graham.  Eileen Jefferson is a 70 y.o. female who presents to [facility] at [facility] today for f/u of R Achille's and R heel pain due to Achille's tendinopathy.  She was last seen by Dr. Denyse Amass on 09/27/21 and was shown a HEP focusing on Alfredson's exercises and calf stretching.  She was also advised to purchase a walking boot.  Today, pt reports that she is feeling much improved, rating her improvement at 80%.  She has been doing her HEP almost daily.  She has transitioned to regular shoes and is no longer using the walking boot.  Diagnostic testing: R heel XR- 09/27/21  Pertinent review of systems: No fevers or chills  Relevant historical information: Diabetes   Exam:  BP (!) 142/82 (BP Location: Right Arm, Patient Position: Sitting, Cuff Size: Normal)   Pulse 83   Ht 5\' 3"  (1.6 m)   Wt 211 lb 3.2 oz (95.8 kg)   SpO2 95%   BMI 37.41 kg/m  General: Well Developed, well nourished, and in no acute distress.   MSK: Right posterior ankle normal. Mildly tender palpation at posterior calcaneus at Achilles tendon insertion.    Lab and Radiology Results EXAM: RIGHT OS CALCIS - 2+ VIEW   COMPARISON:  No recent prior.   FINDINGS: Prominent calcaneal spurring. Cortical thickening and irregularity noted along the plantar aspect of the calcaneus. This may be from prior injury. No acute bony abnormality identified. No evidence of fracture or dislocation. No radiopaque foreign body.   IMPRESSION: 1. Prominent calcaneal spurring. Cortical thickening and irregularity noted along the plantar aspect of the calcaneus, possibly from prior injury.   2.  No acute bony abnormality.  No radiopaque foreign body.     Electronically Signed   By:  Register M.D.   On: 09/28/2021 09:30 I, 09/30/2021, personally (independently) visualized and performed the interpretation of the images attached in this  note.     Assessment and Plan: 70 y.o. female with insertional Achilles tendinitis.  Significantly improved with temporary cam walker boot transitioning to land-based eccentric exercises.  She is weaned out of the boot almost completely at this point.  Plan to advance to body weight based eccentric exercises and check back as needed.  Home exercise program taught in clinic today by me. Total encounter time 20 minutes including face-to-face time with the patient and, reviewing past medical record, and charting on the date of service.   Treatment plan precautions and home exercise program teaching.  Discussed warning signs or symptoms. Please see discharge instructions. Patient expresses understanding.   The above documentation has been reviewed and is accurate and complete 73, M.D.

## 2021-11-08 ENCOUNTER — Ambulatory Visit (INDEPENDENT_AMBULATORY_CARE_PROVIDER_SITE_OTHER): Payer: Medicare HMO | Admitting: Family Medicine

## 2021-11-08 ENCOUNTER — Encounter: Payer: Self-pay | Admitting: Family Medicine

## 2021-11-08 ENCOUNTER — Other Ambulatory Visit: Payer: Self-pay

## 2021-11-08 VITALS — BP 142/82 | HR 83 | Ht 63.0 in | Wt 211.2 lb

## 2021-11-08 DIAGNOSIS — M7661 Achilles tendinitis, right leg: Secondary | ICD-10-CM

## 2021-11-08 NOTE — Patient Instructions (Addendum)
Good to see you today.  Continue your home exercises and add the standing heel lowers daily x 30 reps.  Follow-up as needed.

## 2021-11-10 ENCOUNTER — Encounter (INDEPENDENT_AMBULATORY_CARE_PROVIDER_SITE_OTHER): Payer: Medicare HMO | Admitting: Ophthalmology

## 2021-11-16 ENCOUNTER — Encounter (INDEPENDENT_AMBULATORY_CARE_PROVIDER_SITE_OTHER): Payer: Self-pay | Admitting: Ophthalmology

## 2021-11-16 ENCOUNTER — Other Ambulatory Visit: Payer: Self-pay

## 2021-11-16 ENCOUNTER — Ambulatory Visit (INDEPENDENT_AMBULATORY_CARE_PROVIDER_SITE_OTHER): Payer: Medicare HMO | Admitting: Ophthalmology

## 2021-11-16 DIAGNOSIS — H35072 Retinal telangiectasis, left eye: Secondary | ICD-10-CM

## 2021-11-16 DIAGNOSIS — H35073 Retinal telangiectasis, bilateral: Secondary | ICD-10-CM | POA: Diagnosis not present

## 2021-11-16 DIAGNOSIS — H35071 Retinal telangiectasis, right eye: Secondary | ICD-10-CM

## 2021-11-16 DIAGNOSIS — G4733 Obstructive sleep apnea (adult) (pediatric): Secondary | ICD-10-CM | POA: Diagnosis not present

## 2021-11-16 NOTE — Assessment & Plan Note (Signed)
OS stable acuity, no residual subretinal fluid no residual CME signifying retinal telangiectasis and stabilized

## 2021-11-16 NOTE — Assessment & Plan Note (Signed)
Patient reports excellent compliance on CPAP

## 2021-11-16 NOTE — Assessment & Plan Note (Signed)
Glaucoma follow-up as per Dr. Chalmers Guest

## 2021-11-16 NOTE — Progress Notes (Signed)
11/16/2021     CHIEF COMPLAINT Patient presents for  Chief Complaint  Patient presents with   Retina Evaluation      HISTORY OF PRESENT ILLNESS: Eileen Jefferson is a 70 y.o. female who presents to the clinic today for:   HPI     Retina Evaluation           Laterality: both eyes   MD Performed: performed the HPI with the patient and updated documentation appropriately         Comments   Follow-up OU for retinal telangiectasis worse in the past when patient self discontinued use of CPAP.  An improvement upon reinstitution of nightly CPAP for well-documented sleep apnea.  Follow-up today to monitor for visual acuity changes and/or change in macular anatomy today 6 months after last exam      Last edited by Hurman Horn, MD on 11/16/2021  8:33 AM.      Referring physician: Marylynn Pearson, MD [address] [place],  [place] [address]  HISTORICAL INFORMATION:   Selected notes from the MEDICAL RECORD NUMBER    Lab Results  Component Value Date   HGBA1C 6.6 (H) 05/31/2021     CURRENT MEDICATIONS: Current Outpatient Medications (Ophthalmic Drugs)  Medication Sig   brimonidine-timolol (COMBIGAN) 0.2-0.5 % ophthalmic solution Place 1 drop into the left eye 2 (two) times daily.   brinzolamide (AZOPT) 1 % ophthalmic suspension Place 1 drop into the left eye 2 (two) times daily.   Travoprost, BAK Free, (TRAVATAN) 0.004 % SOLN ophthalmic solution 1 drop at bedtime.   No current facility-administered medications for this visit. (Ophthalmic Drugs)   Current Outpatient Medications (Other)  Medication Sig   Accu-Chek Softclix Lancets lancets Test blood sugar once daily. Dx: E11.9   Alcohol Swabs PADS Test blood sugar once daily. Dx: E11.9   allopurinol (ZYLOPRIM) 100 MG tablet Take 1 tablet (100 mg total) by mouth daily.   aspirin EC 81 MG tablet Take 81 mg by mouth daily.   Blood Glucose Calibration (ACCU-CHEK AVIVA) SOLN Test blood sugar once daily. Dx:  E11.9   Blood Glucose Monitoring Suppl (ACCU-CHEK AVIVA PLUS) w/Device KIT Test blood sugar once daily. Dx: E11.9   colchicine 0.6 MG tablet Take 1 tablet (0.6 mg total) by mouth daily as needed (gout or psuedogout pain).   famotidine (PEPCID) 40 MG tablet TAKE 1 TABLET (40 MG TOTAL) BY MOUTH 2  TIMES DAILY AS NEEDED FOR HEARTBURN OR INDIGESTION.   glucose blood (ACCU-CHEK AVIVA PLUS) test strip Test blood sugar once daily. Dx: E11.9   lisinopril-hydrochlorothiazide (ZESTORETIC) 10-12.5 MG tablet Take 1 tablet by mouth daily.   metFORMIN (GLUCOPHAGE) 500 MG tablet Take 1 tablet (500 mg total) by mouth 2 (two) times daily with a meal.   omeprazole (PRILOSEC) 40 MG capsule TAKE 1 CAPSULE (40 MG TOTAL) BY MOUTH DAILY. 30-60 MINUTES BEFORE BREAKFAST   rosuvastatin (CRESTOR) 40 MG tablet Take 1 tablet (40 mg total) by mouth daily.   No current facility-administered medications for this visit. (Other)      REVIEW OF SYSTEMS: ROS   Positive for: Musculoskeletal Last edited by Hurman Horn, MD on 11/16/2021  8:33 AM.       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)    Past Surgical History:  Procedure Laterality Date   ABDOMINAL HYSTERECTOMY N/A    Phreesia 11/14/2020   APPENDECTOMY     c-section     x 2   CATARACT EXTRACTION     bilateral   CESAREAN  SECTION N/A    [employer]reesia 11/14/2020   EYE SURGERY N/A    Phreesia 11/14/2020   SCLERAL BUCKLE     right   TONSILLECTOMY     TOTAL ABDOMINAL HYSTERECTOMY     TUBAL LIGATION     VITRECTOMY     bilateral    FAMILY HISTORY Family History  Problem Relation Age of Onset   Colon polyps Mother        brothers x2   Clotting disorder Mother        brother x 2, MGM   Heart disease Brother        mother, MGM   Diabetes Brother        x 2, MGM   Stroke Maternal Grandmother    Colon cancer Neg Hx    Breast cancer Neg Hx     SOCIAL HISTORY Social History   Tobacco Use   Smoking status: Former    Packs/day: 0.20    Years: 1.00    Pack years: 0.20    Types: Cigarettes    Quit date: 12/11/1968    Years since quitting: 52.9   Smokeless tobacco: Never  Substance Use Topics   Alcohol use: No   Drug use: No         OPHTHALMIC EXAM:  Base Eye Exam     Visual Acuity (ETDRS)       Right Left   Dist cc 20/200 20/50   Dist ph cc NI NI    Correction: Glasses         Tonometry (Tonopen, 8:29 AM)       Right Left   Pressure 17 16         Pupils       Pupils APD   Right PERRL None   Left PERRL None         Visual Fields       Left Right   Restrictions Partial outer superior temporal, inferior temporal, superior nasal, inferior nasal deficiencies Partial outer superior temporal, inferior temporal, superior nasal, inferior nasal deficiencies         Extraocular Movement       Right Left    Full, Ortho Full, Ortho         Neuro/Psych     Oriented x3: Yes   Mood/Affect: Normal         Dilation     Both eyes:            Slit Lamp and Fundus Exam     External Exam       Right Left   External Normal Normal         Slit Lamp Exam       Right Left   Lids/Lashes Normal  Normal   Conjunctiva/Sclera White and quiet White and quiet   Cornea Clear Clear   Anterior Chamber Deep and quiet Deep and quiet   Iris Round and reactive Round  and reactive   Lens Anterior chamber intraocular lens Posterior chamber intraocular lens   Anterior Vitreous Normal Normal         Fundus Exam       Right Left   Posterior Vitreous Vitrectomized Vitrectomized   Disc 2+ Pallor Pallor 1+   C/D Ratio 0.35 0.6-0.7   Macula Mild epiretinal membrane No obvious CME.   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   Periphery Good PRP, hyperpigmented Good PRP, hyperpigmented            IMAGING AND PROCEDURES  Imaging and Procedures for 11/16/21  OCT, Retina - OU - Both Eyes       Right Eye Quality was good. Central Foveal Thickness: 346. Progression has improved. Findings include cystoid macular edema.   Left Eye Quality was good. Scan locations included subfoveal. Central Foveal Thickness: 296. Progression has improved.   Notes OD with chronic cystoid macular edema temporally, yet less than last office visit dated 11-17-2011  most of it looks atrophic but much less center involved cystoid change as compared to October 2020 prior to restart of of CPAP use  OS, complete resolution of CME temporal to the fovea as well as ,  subfoveal serous retinal detachment completely resolved centrally post restart of CPAP use November 2020             ASSESSMENT/PLAN:  OSA (obstructive sleep apnea) Patient reports excellent compliance on CPAP  Retinal telangiectasia of left eye OS stable acuity, no residual subretinal fluid no residual CME signifying retinal telangiectasis and stabilized  Glaucoma Glaucoma follow-up as per Dr. Marylynn Pearson     ICD-10-CM   1. OSA (obstructive sleep apnea)  G47.33 OCT, Retina - OU - Both Eyes    2. 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  Patient continues on CPAP to control macular hypoxic stress so as to minimize occurrences of macular telangiectasia type II superimposed upon previous  retinal disease  2.  Glaucoma follow-up management as per Dr. Marylynn Pearson  3.  Ophthalmic Meds Ordered this visit:  No orders of the defined types were placed in this encounter.      Return in about 9 months (around 08/17/2022) for DILATE OU, OCT, COLOR FP.  There are no Patient Instructions on file for this visit.   Explained the diagnoses, plan, and follow up with the patient and they expressed understanding.  Patient expressed understanding of the importance of proper follow up care.   Clent Demark Aalyiah Camberos M.D. Diseases & Surgery of the Retina and Vitreous [facility] [place] 11/16/21     Abbreviations: M myopia (nearsighted); A astigmatism; H hyperopia (farsighted); P presbyopia; Mrx spectacle prescription;  CTL contact lenses; OD right eye; OS left eye; OU both eyes  XT exotropia; ET esotropia; PEK punctate epithelial keratitis; PEE punctate epithelial erosions; DES dry eye syndrome; MGD meibomian gland dysfunction; ATs artificial tears; PFAT's preservative free artificial tears; [place] nuclear sclerotic cataract; PSC posterior subcapsular cataract; ERM epi-retinal membrane; PVD posterior vitreous detachment; RD retinal detachment; DM diabetes mellitus; DR diabetic retinopathy; NPDR non-proliferative diabetic retinopathy; PDR proliferative diabetic retinopathy; CSME clinically significant macular edema; DME diabetic macular  edema; dbh dot blot hemorrhages; CWS cotton wool spot; POAG primary open angle glaucoma; C/D cup-to-disc ratio; HVF humphrey visual field; GVF goldmann visual field; OCT optical coherence tomography; IOP intraocular pressure; BRVO Branch retinal vein occlusion; CRVO central retinal vein occlusion; CRAO central retinal artery occlusion; BRAO branch retinal artery occlusion; RT retinal tear; SB scleral buckle; PPV pars plana vitrectomy; VH Vitreous hemorrhage; PRP panretinal laser photocoagulation; IVK intravitreal kenalog; VMT vitreomacular traction; MH Macular  hole;  NVD neovascularization of the disc; NVE neovascularization elsewhere; AREDS age related eye disease study; ARMD age related macular degeneration; POAG primary open angle glaucoma; EBMD epithelial/anterior basement membrane dystrophy; ACIOL anterior chamber intraocular lens; IOL intraocular lens; PCIOL posterior chamber intraocular lens; Phaco/IOL phacoemulsification with intraocular lens placement; [place] photorefractive keratectomy; LASIK laser assisted in situ keratomileusis; HTN hypertension; DM diabetes mellitus; COPD chronic obstructive pulmonary disease

## 2021-11-30 ENCOUNTER — Ambulatory Visit: Payer: Medicare HMO | Admitting: Emergency Medicine

## 2021-12-08 ENCOUNTER — Other Ambulatory Visit: Payer: Self-pay

## 2021-12-08 ENCOUNTER — Ambulatory Visit (AMBULATORY_SURGERY_CENTER): Payer: Medicare HMO | Admitting: *Deleted

## 2021-12-08 VITALS — Ht 63.0 in | Wt 208.0 lb

## 2021-12-08 DIAGNOSIS — Z1211 Encounter for screening for malignant neoplasm of colon: Secondary | ICD-10-CM

## 2021-12-08 DIAGNOSIS — Z8371 Family history of colonic polyps: Secondary | ICD-10-CM

## 2021-12-08 MED ORDER — NA SULFATE-K SULFATE-MG SULF 17.5-3.13-1.6 GM/177ML PO SOLN: 1.0000 | Freq: Once | ORAL | 0 refills | Status: AC

## 2021-12-08 NOTE — Progress Notes (Signed)
[name] previsit is done over the phone and all paperwork (prep instructions, blank consent form to just read over) sent to patient by mail and MyChart. [name] name and DOB verified at the beginning of the previsit.  Pt denies any difficulty with ambulating.    No trouble with anesthesia, denies being told they were difficult to intubate, or hx/fam hx of malignant hyperthermia per pt   No egg or soy allergy  No home oxygen use   No medications for weight loss taken  Pt denies constipation issues  Pt informed that we do not do prior authorizations for prep

## 2021-12-14 ENCOUNTER — Ambulatory Visit (INDEPENDENT_AMBULATORY_CARE_PROVIDER_SITE_OTHER): Payer: Medicare HMO | Admitting: Emergency Medicine

## 2021-12-14 ENCOUNTER — Other Ambulatory Visit: Payer: Self-pay

## 2021-12-14 ENCOUNTER — Encounter: Payer: Self-pay | Admitting: Emergency Medicine

## 2021-12-14 VITALS — BP 144/82 | HR 87 | Temp 98.5°F | Ht 63.0 in | Wt 211.0 lb

## 2021-12-14 DIAGNOSIS — E1159 Type 2 diabetes mellitus with other circulatory complications: Secondary | ICD-10-CM | POA: Diagnosis not present

## 2021-12-14 DIAGNOSIS — N1831 Chronic kidney disease, stage 3a: Secondary | ICD-10-CM | POA: Diagnosis not present

## 2021-12-14 DIAGNOSIS — E1169 Type 2 diabetes mellitus with other specified complication: Secondary | ICD-10-CM | POA: Diagnosis not present

## 2021-12-14 DIAGNOSIS — E785 Hyperlipidemia, unspecified: Secondary | ICD-10-CM

## 2021-12-14 DIAGNOSIS — I152 Hypertension secondary to endocrine disorders: Secondary | ICD-10-CM

## 2021-12-14 LAB — POCT GLYCOSYLATED HEMOGLOBIN (HGB A1C): Hemoglobin A1C: 6.4 % — AB (ref 4.0–5.6)

## 2021-12-14 MED ORDER — DAPAGLIFLOZIN PROPANEDIOL 10 MG PO TABS: 10.0000 mg | ORAL_TABLET | Freq: Every day | ORAL | 3 refills | Status: AC

## 2021-12-14 MED ORDER — VALSARTAN 80 MG PO TABS: 80.0000 mg | ORAL_TABLET | Freq: Every day | ORAL | 3 refills | Status: DC

## 2021-12-14 NOTE — Patient Instructions (Signed)

## 2021-12-14 NOTE — Assessment & Plan Note (Signed)
Elevated blood pressure readings at home in the office. Recent BMP shows mild hypokalemia.  Patient has history of CKD stage IIIa. Will avoid diuretic and start ARB. Start valsartan 80 mg daily. Stop lisinopril-hydrochlorothiazide. Dietary approaches to stop hypertension discussed. Well-controlled diabetes with hemoglobin A1c at 6.4. Patient has history of CKD stage IIIa.  Will benefit from Farxiga 10 mg daily. Stop metformin to avoid more renal toxicity. Diet and nutrition discussed. Follow-up in 3 months.

## 2021-12-14 NOTE — Progress Notes (Signed)
Eileen Jefferson 71 y.o.   Chief Complaint  Patient presents with   Diabetes   Hypertension    HISTORY OF PRESENT ILLNESS: This is a 71 y.o. female here for follow-up of diabetes and hypertension. Doing well.  Eating better.  Has no complaints or any other medical concerns today. Lab Results  Component Value Date   HGBA1C 6.6 (H) 05/31/2021   BP Readings from Last 3 Encounters:  12/14/21 (!) 144/82  11/08/21 (!) 142/82  10/18/21 128/76   Wt Readings from Last 3 Encounters:  12/14/21 211 lb (95.7 kg)  12/08/21 208 lb (94.3 kg)  11/08/21 211 lb 3.2 oz (95.8 kg)     HPI   Prior to Admission medications   Medication Sig Start Date End Date Taking? Authorizing Provider  Accu-Chek Softclix Lancets lancets Test blood sugar once daily. Dx: E11.9 04/26/20  Yes Horald Pollen, MD  Alcohol Swabs PADS Test blood sugar once daily. Dx: E11.9 10/25/16  Yes Shawnee Knapp, MD  allopurinol (ZYLOPRIM) 100 MG tablet Take 1 tablet (100 mg total) by mouth daily. 10/19/21 12/14/21 Yes Gregor Hams, MD  aspirin EC 81 MG tablet Take 81 mg by mouth daily.   Yes [provider]  Blood Glucose Calibration (ACCU-CHEK AVIVA) SOLN Test blood sugar once daily. Dx: E11.9 10/25/16  Yes Shawnee Knapp, MD  Blood Glucose Monitoring Suppl (ACCU-CHEK AVIVA PLUS) w/Device KIT Test blood sugar once daily. Dx: E11.9 10/25/16  Yes Shawnee Knapp, MD  colchicine 0.6 MG tablet Take 1 tablet (0.6 mg total) by mouth daily as needed (gout or psuedogout pain). 10/18/21  Yes Gregor Hams, MD  famotidine (PEPCID) 40 MG tablet TAKE 1 TABLET (40 MG TOTAL) BY MOUTH 2  TIMES DAILY AS NEEDED FOR HEARTBURN OR INDIGESTION. 07/10/20  Yes Sharne Linders, Ines Bloomer, MD  glucose blood (ACCU-CHEK AVIVA PLUS) test strip Test blood sugar once daily. Dx: E11.9 04/26/20  Yes Horald Pollen, MD  lisinopril-hydrochlorothiazide (ZESTORETIC) 10-12.5 MG tablet Take 1 tablet by mouth daily. 08/10/21 12/14/21 Yes Taryn Shellhammer, Ines Bloomer,  MD  metFORMIN (GLUCOPHAGE) 500 MG tablet Take 1 tablet (500 mg total) by mouth 2 (two) times daily with a meal. 04/26/20  Yes Serge Main, Ines Bloomer, MD  omeprazole (PRILOSEC) 40 MG capsule TAKE 1 CAPSULE (40 MG TOTAL) BY MOUTH DAILY. 30-60 MINUTES BEFORE BREAKFAST 01/07/21  Yes Izyan Ezzell, Ines Bloomer, MD  rosuvastatin (CRESTOR) 40 MG tablet Take 1 tablet (40 mg total) by mouth daily. 04/26/20  Yes Forrestine Lecrone, Ines Bloomer, MD  brimonidine-timolol (COMBIGAN) 0.2-0.5 % ophthalmic solution Place 1 drop into the left eye 2 (two) times daily. Patient not taking: Reported on 12/14/2021    [provider]  brinzolamide (AZOPT) 1 % ophthalmic suspension Place 1 drop into the left eye 2 (two) times daily. Patient not taking: Reported on 12/14/2021    [provider]  Travoprost, BAK Free, (TRAVATAN) 0.004 % SOLN ophthalmic solution 1 drop at bedtime. 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   Hyperuricemia 04/27/2020   Gastroesophageal reflux disease 11/23/2018   DDD (degenerative disc disease), cervical 07/11/2017   Localized osteoarthrosis of right shoulder region 07/11/2017   Anemia 07/11/2017   Hyperlipidemia 06/28/2016   Thyroid disease 06/28/2016   Diabetic retinopathy ([place]) 05/05/2015   Class 2 severe obesity due to excess calories with serious comorbidity and body mass index (BMI) of 38.0 to 38.9 in adult ([place]) 01/11/2015   Glaucoma 04/06/2014   Macular degeneration 04/06/2014    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, 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   Hypertension associated with diabetes ([place]) 10/02/2013   Sarcoidosis 10/02/2013   Dyslipidemia associated with type 2 diabetes mellitus ([place]) 10/02/2013   OSA (obstructive sleep apnea) 10/23/2012    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   Iron deficiency anemia    Pulmonary sarcoidosis (HCC)    Sleep apnea    uses CPAP   Thyroid disease    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)     Past Surgical History:  Procedure Laterality Date   ABDOMINAL HYSTERECTOMY N/A    [facility] 11/14/2020   APPENDECTOMY     c-section     x 2   CATARACT EXTRACTION     bilateral   CESAREAN SECTION N/A    [facility] 11/14/2020   COLONOSCOPY     EYE SURGERY N/A    [facility] 11/14/2020   SCLERAL BUCKLE     right   TONSILLECTOMY     TOTAL ABDOMINAL HYSTERECTOMY     TUBAL LIGATION     VITRECTOMY     bilateral    Social History   Socioeconomic History   Marital status: Married    Spouse name: Not on file   Number of children: 2   Years of education: Not on file   Highest education level: Not on file  Occupational History   Occupation: behavioral health  Tobacco Use   Smoking status: Former    Packs/day: 0.20    Years: 1.00    Pack years: 0.20    Types: Cigarettes    Quit date: 12/11/1968    Years since quitting: 53.0   Smokeless tobacco: Never  Vaping Use   Vaping Use: Never used  Substance and Sexual Activity   Alcohol use: No   Drug use: No   Sexual activity: Not on file  Other Topics Concern   Not on file  Social History Narrative   Not on file   Social  Determinants of Health   Financial Resource Strain: Not on file  Food Insecurity: Not on file  Transportation Needs: Not on file  Physical Activity: Not on file  Stress: Not on file  Social Connections: Not on file  Intimate Partner Violence: Not on file    Family History  Problem Relation Age of Onset   Colon polyps Mother        brothers x2   Clotting disorder Mother        brother x 2, MGM   Heart disease Brother        mother, MGM   Diabetes Brother  x 2, MGM   Stroke Maternal Grandmother    Colon cancer Neg Hx    Breast cancer Neg Hx    Esophageal cancer Neg Hx    Rectal cancer Neg Hx    Stomach cancer Neg Hx      Review of Systems  Constitutional: Negative.  Negative for chills and fever.  HENT: Negative.  Negative for congestion and sore throat.   Respiratory: Negative.  Negative for cough and shortness of breath.   Cardiovascular: Negative.  Negative for chest pain and palpitations.  Gastrointestinal: Negative.  Negative for abdominal pain, diarrhea, nausea and vomiting.  Genitourinary: Negative.  Negative for dysuria and flank pain.  Skin: Negative.  Negative for rash.  Neurological: Negative.  Negative for dizziness and headaches.  All other systems reviewed and are negative.  Today's Vitals   12/14/21 1458  BP: (!) 144/82  Pulse: 87  Temp: 98.5 F (36.9 C)  TempSrc: Oral  SpO2: 97%  Weight: 211 lb (95.7 kg)  Height: _0  (1.6 m)   Body mass index is 37.38 kg/m.  Physical Exam Vitals reviewed.  Constitutional:      Appearance: Normal appearance.  HENT:     Head: Normocephalic.  Eyes:     Extraocular Movements: Extraocular movements intact.     Conjunctiva/sclera: Conjunctivae normal.     Pupils: Pupils are equal, round, and reactive to light.  Cardiovascular:     Rate and Rhythm: Normal rate and regular rhythm.     Pulses: Normal pulses.     Heart sounds: Normal heart sounds.  Pulmonary:     Effort: Pulmonary effort is normal.      Breath sounds: Normal breath sounds.  Musculoskeletal:     Cervical back: Normal range of motion and neck supple. No tenderness.  Skin:    General: Skin is warm and dry.     Capillary Refill: Capillary refill takes less than 2 seconds.  Neurological:     General: No focal deficit present.     Mental Status: She is alert and oriented to person, place, and time.  Psychiatric:        Mood and Affect: Mood normal.        Behavior: Behavior normal.   Results for orders placed or performed in visit on 12/14/21 (from the past 24 hour(s))  POCT glycosylated hemoglobin (Hb A1C)     Status: Abnormal   Collection Time: 12/14/21  3:14 PM  Result Value Ref Range   Hemoglobin A1C 6.4 (A) 4.0 - 5.6 %   HbA1c POC (<> result, manual entry)     HbA1c, POC (prediabetic range)     HbA1c, POC (controlled diabetic range)       ASSESSMENT & PLAN: A total of 45 minutes was spent with the patient and counseling/coordination of care regarding preparing for this visit, review of most recent office visit notes, treatment of hypertension and diabetes, review of all medications and changes made, cardiovascular risks associated with hypertension and diabetes, treatment of chronic kidney disease and how to reduce the risk of sustained GFR decline, education on nutrition, prognosis, documentation and need for follow-up.  Problem List Items Addressed This Visit       Cardiovascular and Mediastinum   Hypertension associated with diabetes ([place]) - Primary    Elevated blood pressure readings at home in the office. Recent BMP shows mild hypokalemia.  Patient has history of CKD stage IIIa. Will avoid diuretic and start ARB. Start valsartan 80 mg daily. Stop lisinopril-hydrochlorothiazide.  Dietary approaches to stop hypertension discussed. Well-controlled diabetes with hemoglobin A1c at 6.4. Patient has history of CKD stage IIIa.  Will benefit from Farxiga 10 mg daily. Stop metformin to avoid more renal  toxicity. Diet and nutrition discussed. Follow-up in 3 months.      Relevant Medications   dapagliflozin propanediol (FARXIGA) 10 MG TABS tablet   valsartan (DIOVAN) 80 MG tablet   Other Relevant Orders   POCT glycosylated hemoglobin (Hb A1C) (Completed)     Endocrine   Dyslipidemia associated with type 2 diabetes mellitus (HCC)   Relevant Medications   dapagliflozin propanediol (FARXIGA) 10 MG TABS tablet   valsartan (DIOVAN) 80 MG tablet     Genitourinary   Stage 3a chronic kidney disease (HCC)   Relevant Medications   dapagliflozin propanediol (FARXIGA) 10 MG TABS tablet   Patient Instructions  Hypertension, Adult High blood pressure (hypertension) is when the force of blood pumping through the arteries is too strong. The arteries are the blood vessels that carry blood from the heart throughout the body. Hypertension forces the heart to work harder to pump blood and may cause arteries to become narrow or stiff. Untreated or uncontrolled hypertension can cause a heart attack, heart failure, a stroke, kidney disease, and other problems. A blood pressure reading consists of a higher number over a lower number. Ideally, your blood pressure should be below 120/80. The first ("top") number is called the systolic pressure. It is a measure of the pressure in your arteries as your heart beats. The second ("bottom") number is called the diastolic pressure. It is a measure of the pressure in your arteries as the heart relaxes. What are the causes? The exact cause of this condition is not known. There are some conditions that result in or are related to high blood pressure. What increases the risk? Some risk factors for high blood pressure are under your control. The following factors may make you more likely to develop this condition: Smoking. Having type 2 diabetes mellitus, high cholesterol, or both. Not getting enough exercise or physical activity. Being overweight. Having too much fat,  sugar, calories, or salt (sodium) in your diet. Drinking too much alcohol. Some risk factors for high blood pressure may be difficult or impossible to change. Some of these factors include: Having chronic kidney disease. Having a family history of high blood pressure. Age. Risk increases with age. Race. You may be at higher risk if you are African American. Gender. Men are at higher risk than women before age 87. After age 52, women are at higher risk than men. Having obstructive sleep apnea. Stress. What are the signs or symptoms? High blood pressure may not cause symptoms. Very high blood pressure (hypertensive crisis) may cause: Headache. Anxiety. Shortness of breath. Nosebleed. Nausea and vomiting. Vision changes. Severe chest pain. Seizures. How is this diagnosed? This condition is diagnosed by measuring your blood pressure while you are seated, with your arm resting on a flat surface, your legs uncrossed, and your feet flat on the floor. The cuff of the blood pressure monitor will be placed directly against the skin of your upper arm at the level of your heart. It should be measured at least twice using the same arm. Certain conditions can cause a difference in blood pressure between your right and left arms. Certain factors can cause blood pressure readings to be lower or higher than normal for a short period of time: When your blood pressure is higher when you are in a  health care provider's office than when you are at home, this is called white coat hypertension. Most people with this condition do not need medicines. When your blood pressure is higher at home than when you are in a health care provider's office, this is called masked hypertension. Most people with this condition may need medicines to control blood pressure. If you have a high blood pressure reading during one visit or you have normal blood pressure with other risk factors, you may be asked to: Return on a different  day to have your blood pressure checked again. Monitor your blood pressure at home for 1 week or longer. If you are diagnosed with hypertension, you may have other blood or imaging tests to help your health care provider understand your overall risk for other conditions. How is this treated? This condition is treated by making healthy lifestyle changes, such as eating healthy foods, exercising more, and reducing your alcohol intake. Your health care provider may prescribe medicine if lifestyle changes are not enough to get your blood pressure under control, and if: Your systolic blood pressure is above 130. Your diastolic blood pressure is above 80. Your personal target blood pressure may vary depending on your medical conditions, your age, and other factors. Follow these instructions at home: Eating and drinking  Eat a diet that is high in fiber and potassium, and low in sodium, added sugar, and fat. An example eating plan is called the DASH (Dietary Approaches to Stop Hypertension) diet. To eat this way: Eat plenty of fresh fruits and vegetables. Try to fill one half of your plate at each meal with fruits and vegetables. Eat whole grains, such as whole-wheat pasta, brown rice, or whole-grain bread. Fill about one fourth of your plate with whole grains. Eat or drink low-fat dairy products, such as skim milk or low-fat yogurt. Avoid fatty cuts of meat, processed or cured meats, and poultry with skin. Fill about one fourth of your plate with lean proteins, such as fish, chicken without skin, beans, eggs, or tofu. Avoid pre-made and processed foods. These tend to be higher in sodium, added sugar, and fat. Reduce your daily sodium intake. Most people with hypertension should eat less than 1,500 mg of sodium a day. Do not drink alcohol if: Your health care provider tells you not to drink. You are pregnant, may be pregnant, or are planning to become pregnant. If you drink alcohol: Limit how much you  use to: 0-1 drink a day for women. 0-2 drinks a day for men. Be aware of how much alcohol is in your drink. In the [place] one drink equals one 12 oz bottle of beer (355 mL), one 5 oz glass of wine (148 mL), or one 1 oz glass of hard liquor (44 mL). Lifestyle  Work with your health care provider to maintain a healthy body weight or to lose weight. Ask what an ideal weight is for you. Get at least 30 minutes of exercise most days of the week. Activities may include walking, swimming, or biking. Include exercise to strengthen your muscles (resistance exercise), such as Pilates or lifting weights, as part of your weekly exercise routine. Try to do these types of exercises for 30 minutes at least 3 days a week. Do not use any products that contain nicotine or tobacco, such as cigarettes, e-cigarettes, and chewing tobacco. If you need help quitting, ask your health care provider. Monitor your blood pressure at home as told by your health care provider. Keep all  follow-up visits as told by your health care provider. This is important. Medicines Take over-the-counter and prescription medicines only as told by your health care provider. Follow directions carefully. Blood pressure medicines must be taken as prescribed. Do not skip doses of blood pressure medicine. Doing this puts you at risk for problems and can make the medicine less effective. Ask your health care provider about side effects or reactions to medicines that you should watch for. Contact a health care provider if you: Think you are having a reaction to a medicine you are taking. Have headaches that keep coming back (recurring). Feel dizzy. Have swelling in your ankles. Have trouble with your vision. Get help right away if you: Develop a severe headache or confusion. Have unusual weakness or numbness. Feel faint. Have severe pain in your chest or abdomen. Vomit repeatedly. Have trouble breathing. Summary Hypertension is when the  force of blood pumping through your arteries is too strong. If this condition is not controlled, it may put you at risk for serious complications. Your personal target blood pressure may vary depending on your medical conditions, your age, and other factors. For most people, a normal blood pressure is less than 120/80. Hypertension is treated with lifestyle changes, medicines, or a combination of both. Lifestyle changes include losing weight, eating a healthy, low-sodium diet, exercising more, and limiting alcohol. This information is not intended to replace advice given to you by your health care provider. Make sure you discuss any questions you have with your health care provider. Document Revised: 08/07/2018 Document Reviewed: 08/07/2018 Elsevier Patient Education  2022 [place], MD [facility]

## 2021-12-19 ENCOUNTER — Encounter: Payer: Self-pay | Admitting: Internal Medicine

## 2021-12-22 ENCOUNTER — Other Ambulatory Visit: Payer: Self-pay

## 2021-12-22 ENCOUNTER — Encounter: Payer: Self-pay | Admitting: Internal Medicine

## 2021-12-22 ENCOUNTER — Ambulatory Visit (AMBULATORY_SURGERY_CENTER): Payer: Medicare HMO | Admitting: Internal Medicine

## 2021-12-22 VITALS — BP 131/81 | HR 78 | Temp 98.0°F | Resp 18 | Ht 63.0 in | Wt 208.0 lb

## 2021-12-22 DIAGNOSIS — Z1211 Encounter for screening for malignant neoplasm of colon: Secondary | ICD-10-CM | POA: Diagnosis not present

## 2021-12-22 DIAGNOSIS — I10 Essential (primary) hypertension: Secondary | ICD-10-CM | POA: Diagnosis not present

## 2021-12-22 DIAGNOSIS — G4730 Sleep apnea, unspecified: Secondary | ICD-10-CM | POA: Diagnosis not present

## 2021-12-22 MED ORDER — SODIUM CHLORIDE 0.9 % IV SOLN: 500.0000 mL | Freq: Once | INTRAVENOUS | Status: DC

## 2021-12-22 NOTE — Progress Notes (Signed)
PT taken to PACU. Monitors in place. VSS. Report given to RN. 

## 2021-12-22 NOTE — Patient Instructions (Signed)
Resume previous diet and medications. Repeat Colonoscopy in 10 years for screening purposes. ° °YOU HAD AN ENDOSCOPIC PROCEDURE TODAY AT THE [facility]:   Refer to the procedure report that was given to you for any specific questions about what was found during the examination.  If the procedure report does not answer your questions, please call your gastroenterologist to clarify.  If you requested that your care partner not be given the details of your procedure findings, then the procedure report has been included in a sealed envelope for you to review at your convenience later. ° °YOU SHOULD EXPECT: Some feelings of bloating in the abdomen. Passage of more gas than usual.  Walking can help get rid of the air that was put into your GI tract during the procedure and reduce the bloating. If you had a lower endoscopy (such as a colonoscopy or flexible sigmoidoscopy) you may notice spotting of blood in your stool or on the toilet paper. If you underwent a bowel prep for your procedure, you may not have a normal bowel movement for a few days. ° °Please Note:  You might notice some irritation and congestion in your nose or some drainage.  This is from the oxygen used during your procedure.  There is no need for concern and it should clear up in a day or so. ° °SYMPTOMS TO REPORT IMMEDIATELY: ° °Following lower endoscopy (colonoscopy or flexible sigmoidoscopy): ° Excessive amounts of blood in the stool ° Significant tenderness or worsening of abdominal pains ° Swelling of the abdomen that is new, acute ° Fever of 100°F or higher ° °For urgent or emergent issues, a gastroenterologist can be reached at any hour by calling [phone]. °Do not use MyChart messaging for urgent concerns.  ° ° °DIET:  We do recommend a small meal at first, but then you may proceed to your regular diet.  Drink plenty of fluids but you should avoid alcoholic beverages for 24 hours. ° °ACTIVITY:  You should plan to take it easy  for the rest of today and you should NOT DRIVE or use heavy machinery until tomorrow (because of the sedation medicines used during the test).   ° °FOLLOW UP: °Our staff will call the number listed on your records 48-72 hours following your procedure to check on you and address any questions or concerns that you may have regarding the information given to you following your procedure. If we do not reach you, we will leave a message.  We will attempt to reach you two times.  During this call, we will ask if you have developed any symptoms of COVID 19. If you develop any symptoms (ie: fever, flu-like symptoms, shortness of breath, cough etc.) before then, please call [phone].  If you test positive for Covid 19 in the 2 weeks post procedure, please call and report this information to us.   ° °If any biopsies were taken you will be contacted by phone or by letter within the next 1-3 weeks.  Please call us at [phone] if you have not heard about the biopsies in 3 weeks.  ° ° °SIGNATURES/CONFIDENTIALITY: °You and/or your care partner have signed paperwork which will be entered into your electronic medical record.  These signatures attest to the fact that that the information above on your After Visit Summary has been reviewed and is understood.  Full responsibility of the confidentiality of this discharge information lies with you and/or your care-partner.  °

## 2021-12-22 NOTE — Op Note (Signed)
[facility] Patient Name: Eileen Jefferson Procedure Date: 12/22/2021 10:57 AM MRN: [id number] Endoscopist: Docia Chuck. Henrene Pastor , MD Age: 71 Referring MD:  Date of Birth: 08-20-1951 Gender: Female Account #: [id number] Procedure:                Colonoscopy Indications:              Screening for colorectal malignant neoplasm.                            Negative previous exam 2012 Medicines:                Monitored Anesthesia Care Procedure:                Pre-Anesthesia Assessment:                           - Prior to the procedure, a History and Physical                            was performed, and patient medications and                            allergies were reviewed. The patient's tolerance of                            previous anesthesia was also reviewed. The risks                            and benefits of the procedure and the sedation                            options and risks were discussed with the patient.                            All questions were answered, and informed consent                            was obtained. Prior Anticoagulants: The patient has                            taken no previous anticoagulant or antiplatelet                            agents. ASA Grade Assessment: II - A patient with                            mild systemic disease. After reviewing the risks                            and benefits, the patient was deemed in                            satisfactory condition to undergo the procedure.  After obtaining informed consent, the colonoscope                            was passed under direct vision. Throughout the                            procedure, the patient's blood pressure, pulse, and                            oxygen saturations were monitored continuously. The                            Olympus CF-HQ190L [id number]) Colonoscope was                            introduced through the anus and advanced  to the the                            cecum, identified by appendiceal orifice and                            ileocecal valve. The ileocecal valve, appendiceal                            orifice, and rectum were photographed. The quality                            of the bowel preparation was excellent. The                            colonoscopy was performed without difficulty. The                            patient tolerated the procedure well. The bowel                            preparation used was SUPREP via split dose                            instruction. Scope In: 11:49:48 AM Scope Out: 12:04:10 PM Scope Withdrawal Time: 0 hours 8 minutes 15 seconds  Total Procedure Duration: 0 hours 14 minutes 22 seconds  Findings:                 Many small and large-mouthed diverticula were found                            in the entire colon. The sigmoid colon was fixed                            due to adhesive disease.                           The exam was otherwise without abnormality on  direct and retroflexion views. Complications:            No immediate complications. Estimated blood loss:                            None. Estimated Blood Loss:     Estimated blood loss: none. Impression:               - Diverticulosis in the entire examined colon.                            Fixed sigmoid colon due to adhesive disease.                           - The examination was otherwise normal on direct                            and retroflexion views.                           - No specimens collected. Recommendation:           - Repeat colonoscopy in 10 years for screening                            purposes.                           - Patient has a contact number available for                            emergencies. The signs and symptoms of potential                            delayed complications were discussed with the                            patient.  Return to normal activities tomorrow.                            Written discharge instructions were provided to the                            patient.                           - Resume previous diet.                           - Continue present medications. Docia Chuck. Henrene Pastor, MD 12/22/2021 12:12:59 PM This report has been signed electronically.

## 2021-12-22 NOTE — Progress Notes (Signed)
[name] states no medical or surgical changes since previsit or office visit.   Cw vitals and BC IV.

## 2021-12-22 NOTE — Progress Notes (Signed)
HISTORY OF PRESENT ILLNESS:  Eileen Jefferson is a 71 y.o. female presents today for screening colonoscopy.  Previous examination 2012 was negative for neoplasia.  No active GI complaints  REVIEW OF SYSTEMS:  All non-GI ROS negative.  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 Her smoking use included cigarettes. She has a 0.20 pack-year smoking history. She has never used smokeless tobacco. She reports that she does not drink alcohol and does not use drugs.  family history includes Clotting disorder in her mother; Colon polyps in her mother; Diabetes in her brother; Heart disease in her brother; Stroke in her maternal grandmother.  Allergies  Allergen Reactions   Contrast Media [Iodinated Contrast Media]    Iohexol Other (See Comments)    " made me feel like I was burning inside"       PHYSICAL EXAMINATION:  Vital signs: BP (!) 143/80    Pulse 87    Temp 98  F (36.7 C)    Resp (!) 22    Ht 5\' 3"  (1.6 m)    Wt 208 lb (94.3 kg)    SpO2 93%    BMI 36.85 kg/m  General: Well-developed, well-nourished, no acute distress HEENT: Sclerae are anicteric, conjunctiva pink. Oral mucosa intact Lungs: Clear Heart: Regular Abdomen: soft, nontender, nondistended, no obvious ascites, no peritoneal signs, normal bowel sounds. No organomegaly. Extremities: No edema Psychiatric: alert and oriented x3. Cooperative     ASSESSMENT:  1.  Colon cancer screening.  Average risk.  Negative index exam 2012   PLAN:   1.  Colonoscopy.

## 2021-12-26 ENCOUNTER — Telehealth: Payer: Self-pay

## 2021-12-26 NOTE — Telephone Encounter (Signed)
°  Follow up Call-  Call back number 12/22/2021  Post procedure Call Back phone  # [phone]  Permission to leave phone message Yes  Some recent data might be hidden     Patient questions:  Do you have a fever, pain , or abdominal swelling? No. Pain Score  0 *  Have you tolerated food without any problems? Yes.    Have you been able to return to your normal activities? Yes.    Do you have any questions about your discharge instructions: Diet   No. Medications  No. Follow up visit  No.  Do you have questions or concerns about your Care? No.  Actions: * If pain score is 4 or above: No action needed, pain <4.

## 2022-01-02 ENCOUNTER — Other Ambulatory Visit: Payer: Self-pay | Admitting: Emergency Medicine

## 2022-01-02 DIAGNOSIS — K219 Gastro-esophageal reflux disease without esophagitis: Secondary | ICD-10-CM

## 2022-01-03 ENCOUNTER — Other Ambulatory Visit: Payer: Self-pay | Admitting: Emergency Medicine

## 2022-01-03 DIAGNOSIS — E1169 Type 2 diabetes mellitus with other specified complication: Secondary | ICD-10-CM

## 2022-01-05 DIAGNOSIS — H401132 Primary open-angle glaucoma, bilateral, moderate stage: Secondary | ICD-10-CM | POA: Diagnosis not present

## 2022-01-05 DIAGNOSIS — G4733 Obstructive sleep apnea (adult) (pediatric): Secondary | ICD-10-CM | POA: Diagnosis not present

## 2022-01-05 DIAGNOSIS — H35071 Retinal telangiectasis, right eye: Secondary | ICD-10-CM | POA: Diagnosis not present

## 2022-01-05 DIAGNOSIS — H35072 Retinal telangiectasis, left eye: Secondary | ICD-10-CM | POA: Diagnosis not present

## 2022-01-05 DIAGNOSIS — H35033 Hypertensive retinopathy, bilateral: Secondary | ICD-10-CM | POA: Diagnosis not present

## 2022-01-05 DIAGNOSIS — E113313 Type 2 diabetes mellitus with moderate nonproliferative diabetic retinopathy with macular edema, bilateral: Secondary | ICD-10-CM | POA: Diagnosis not present

## 2022-01-05 DIAGNOSIS — H47233 Glaucomatous optic atrophy, bilateral: Secondary | ICD-10-CM | POA: Diagnosis not present

## 2022-01-05 DIAGNOSIS — H35342 Macular cyst, hole, or pseudohole, left eye: Secondary | ICD-10-CM | POA: Diagnosis not present

## 2022-01-09 ENCOUNTER — Other Ambulatory Visit (INDEPENDENT_AMBULATORY_CARE_PROVIDER_SITE_OTHER): Payer: Self-pay | Admitting: Ophthalmology

## 2022-01-09 NOTE — Progress Notes (Unsigned)
Patient called the office today saying she has increased swelling and tearing in the left eye no change in vision of the left eye over the last 5 days.  Saw Dr. Marylynn Pearson some 4 days previous who provided elastic craft and Refresh Tears for dry eyes in the left eye but now her symptoms are worse and preceded the placement of the eyedrops left eye  I explained to the patient after despite phone call discussion likely has an allergy but could have a viral illness on the surface of the eye and this usually takes 10 to 14 days to run its course.  I will call and prescribe erythromycin ophthalmic ointment apply left eye 3 times daily as needed symptoms but use it at nighttime as well   I offered via voicemail for [facility] in [place], to substitute ciprofloxacin or ophthalmic ointment with the same instructions apply 3 times daily for 7 days no refill Patient to call if symptoms worsen significantly

## 2022-01-23 ENCOUNTER — Other Ambulatory Visit: Payer: Self-pay

## 2022-01-23 DIAGNOSIS — E785 Hyperlipidemia, unspecified: Secondary | ICD-10-CM

## 2022-01-23 DIAGNOSIS — E1165 Type 2 diabetes mellitus with hyperglycemia: Secondary | ICD-10-CM

## 2022-01-23 DIAGNOSIS — I152 Hypertension secondary to endocrine disorders: Secondary | ICD-10-CM

## 2022-01-23 DIAGNOSIS — E1169 Type 2 diabetes mellitus with other specified complication: Secondary | ICD-10-CM

## 2022-01-23 DIAGNOSIS — E1159 Type 2 diabetes mellitus with other circulatory complications: Secondary | ICD-10-CM

## 2022-01-23 MED ORDER — ACCU-CHEK SOFTCLIX LANCETS MISC: 2 refills | Status: DC

## 2022-01-23 MED ORDER — ACCU-CHEK AVIVA PLUS VI STRP: ORAL_STRIP | 2 refills | Status: DC

## 2022-01-23 MED ORDER — ALCOHOL SWABS PADS: MEDICATED_PAD | 2 refills | Status: DC

## 2022-02-06 ENCOUNTER — Other Ambulatory Visit: Payer: Self-pay | Admitting: Family Medicine

## 2022-02-06 DIAGNOSIS — E790 Hyperuricemia without signs of inflammatory arthritis and tophaceous disease: Secondary | ICD-10-CM

## 2022-02-09 DIAGNOSIS — H401132 Primary open-angle glaucoma, bilateral, moderate stage: Secondary | ICD-10-CM | POA: Diagnosis not present

## 2022-02-09 DIAGNOSIS — H47233 Glaucomatous optic atrophy, bilateral: Secondary | ICD-10-CM | POA: Diagnosis not present

## 2022-02-09 DIAGNOSIS — H35342 Macular cyst, hole, or pseudohole, left eye: Secondary | ICD-10-CM | POA: Diagnosis not present

## 2022-02-09 DIAGNOSIS — G4733 Obstructive sleep apnea (adult) (pediatric): Secondary | ICD-10-CM | POA: Diagnosis not present

## 2022-02-09 DIAGNOSIS — H35071 Retinal telangiectasis, right eye: Secondary | ICD-10-CM | POA: Diagnosis not present

## 2022-02-09 DIAGNOSIS — E113313 Type 2 diabetes mellitus with moderate nonproliferative diabetic retinopathy with macular edema, bilateral: Secondary | ICD-10-CM | POA: Diagnosis not present

## 2022-02-09 DIAGNOSIS — H35072 Retinal telangiectasis, left eye: Secondary | ICD-10-CM | POA: Diagnosis not present

## 2022-02-09 DIAGNOSIS — H35033 Hypertensive retinopathy, bilateral: Secondary | ICD-10-CM | POA: Diagnosis not present

## 2022-03-14 ENCOUNTER — Encounter: Payer: Self-pay | Admitting: Emergency Medicine

## 2022-03-14 ENCOUNTER — Ambulatory Visit (INDEPENDENT_AMBULATORY_CARE_PROVIDER_SITE_OTHER): Payer: Medicare HMO | Admitting: Emergency Medicine

## 2022-03-14 VITALS — BP 134/86 | HR 75 | Temp 98.0°F | Ht 63.0 in | Wt 212.4 lb

## 2022-03-14 DIAGNOSIS — E1169 Type 2 diabetes mellitus with other specified complication: Secondary | ICD-10-CM

## 2022-03-14 DIAGNOSIS — M79671 Pain in right foot: Secondary | ICD-10-CM | POA: Diagnosis not present

## 2022-03-14 DIAGNOSIS — I152 Hypertension secondary to endocrine disorders: Secondary | ICD-10-CM | POA: Diagnosis not present

## 2022-03-14 DIAGNOSIS — E1159 Type 2 diabetes mellitus with other circulatory complications: Secondary | ICD-10-CM | POA: Diagnosis not present

## 2022-03-14 DIAGNOSIS — N1831 Chronic kidney disease, stage 3a: Secondary | ICD-10-CM | POA: Diagnosis not present

## 2022-03-14 DIAGNOSIS — E785 Hyperlipidemia, unspecified: Secondary | ICD-10-CM

## 2022-03-14 DIAGNOSIS — M79672 Pain in left foot: Secondary | ICD-10-CM

## 2022-03-14 LAB — COMPREHENSIVE METABOLIC PANEL
ALT: 13 U/L (ref 0–35)
AST: 17 U/L (ref 0–37)
Albumin: 4.1 g/dL (ref 3.5–5.2)
Alkaline Phosphatase: 105 U/L (ref 39–117)
BUN: 14 mg/dL (ref 6–23)
CO2: 30 mEq/L (ref 19–32)
Calcium: 9.8 mg/dL (ref 8.4–10.5)
Chloride: 105 mEq/L (ref 96–112)
Creatinine, Ser: 1.1 mg/dL (ref 0.40–1.20)
GFR: 51.5 mL/min — ABNORMAL LOW (ref >60.00)
Glucose, Bld: 77 mg/dL (ref 70–99)
Potassium: 4 mEq/L (ref 3.5–5.1)
Sodium: 140 mEq/L (ref 135–145)
Total Bilirubin: 0.3 mg/dL (ref 0.2–1.2)
Total Protein: 7.7 g/dL (ref 6.0–8.3)

## 2022-03-14 LAB — CBC WITH DIFFERENTIAL/PLATELET
Basophils Absolute: 0 10*3/uL (ref 0.0–0.1)
Basophils Relative: 0.4 % (ref 0.0–3.0)
Eosinophils Absolute: 0.3 10*3/uL (ref 0.0–0.7)
Eosinophils Relative: 3.8 % (ref 0.0–5.0)
HCT: 33.9 % — ABNORMAL LOW (ref 36.0–46.0)
Hemoglobin: 10.2 g/dL — ABNORMAL LOW (ref 12.0–15.0)
Lymphocytes Relative: 29.4 % (ref 12.0–46.0)
Lymphs Abs: 2.3 10*3/uL (ref 0.7–4.0)
MCHC: 30.2 g/dL (ref 30.0–36.0)
MCV: 69.1 fl — ABNORMAL LOW (ref 78.0–100.0)
Monocytes Absolute: 0.4 10*3/uL (ref 0.1–1.0)
Monocytes Relative: 4.8 % (ref 3.0–12.0)
Neutro Abs: 4.8 10*3/uL (ref 1.4–7.7)
Neutrophils Relative %: 61.6 % (ref 43.0–77.0)
Platelets: 277 10*3/uL (ref 150.0–400.0)
RBC: 4.9 Mil/uL (ref 3.87–5.11)
RDW: 17.3 % — ABNORMAL HIGH (ref 11.5–15.5)
WBC: 7.9 10*3/uL (ref 4.0–10.5)

## 2022-03-14 LAB — HEMOGLOBIN A1C: Hgb A1c MFr Bld: 6.7 % — ABNORMAL HIGH (ref 4.6–6.5)

## 2022-03-14 LAB — LIPID PANEL
Cholesterol: 129 mg/dL (ref 0–200)
HDL: 48 mg/dL (ref >39.00)
LDL Cholesterol: 66 mg/dL (ref 0–99)
NonHDL: 80.97
Total CHOL/HDL Ratio: 3
Triglycerides: 75 mg/dL (ref 0.0–149.0)
VLDL: 15 mg/dL (ref 0.0–40.0)

## 2022-03-14 NOTE — Assessment & Plan Note (Signed)
No signs of diabetic neuropathy.  Good peripheral circulation.  Good distal sensation.  No abnormal physical findings. ?

## 2022-03-14 NOTE — Progress Notes (Signed)
Eileen Jefferson ?71 y.o. ? ? ?Chief Complaint  ?Patient presents with  ? Pain  ?  Pain under both feet   ? ? ?HISTORY OF PRESENT ILLNESS: ?This is a 71 y.o. female with a history of diabetes, hypertension, dyslipidemia here for follow-up ?Gets occasional pain under both feet.  Intermittent for the past couple of months. ?Lab Results  ?Component Value Date  ? HGBA1C 6.4 (A) 12/14/2021  ? ? ? ?HPI ? ? ?Prior to Admission medications   ?Medication Sig Start Date End Date Taking? Authorizing Provider  ?Accu-Chek Softclix Lancets lancets Test blood sugar once daily. Dx: E11.9 01/23/22  Yes Horald Pollen, MD  ?allopurinol (ZYLOPRIM) 100 MG tablet TAKE 1 TABLET BY MOUTH EVERY DAY 02/06/22  Yes Gregor Hams, MD  ?aspirin EC 81 MG tablet Take 81 mg by mouth daily.   Yes [provider]  ?Blood Glucose Calibration (ACCU-CHEK AVIVA) SOLN Test blood sugar once daily. Dx: E11.9 10/25/16  Yes Shawnee Knapp, MD  ?Blood Glucose Monitoring Suppl (ACCU-CHEK AVIVA PLUS) w/Device KIT Test blood sugar once daily. Dx: E11.9 10/25/16  Yes Shawnee Knapp, MD  ?colchicine 0.6 MG tablet Take 1 tablet (0.6 mg total) by mouth daily as needed (gout or psuedogout pain). 10/18/21  Yes Gregor Hams, MD  ?dapagliflozin propanediol (FARXIGA) 10 MG TABS tablet Take 1 tablet (10 mg total) by mouth daily before breakfast. 12/14/21 03/14/22 Yes Mory Herrman, Ines Bloomer, MD  ?famotidine (PEPCID) 40 MG tablet TAKE 1 TABLET (40 MG TOTAL) BY MOUTH 2  TIMES DAILY AS NEEDED FOR HEARTBURN OR INDIGESTION. 07/10/20  Yes Glynis Hunsucker, Ines Bloomer, MD  ?glucose blood (ACCU-CHEK AVIVA PLUS) test strip Test blood sugar once daily. Dx: E11.9 01/23/22  Yes Horald Pollen, MD  ?omeprazole (PRILOSEC) 40 MG capsule TAKE 1 CAPSULE (40 MG TOTAL) DAILY 30-60 MINUTES BEFORE BREAKFAST 01/02/22  Yes Maggie Dworkin, Ines Bloomer, MD  ?rosuvastatin (CRESTOR) 40 MG tablet TAKE 1 TABLET EVERY DAY 01/03/22  Yes Chareese Sergent, Ines Bloomer, MD  ?valsartan (DIOVAN) 80 MG tablet Take 1  tablet (80 mg total) by mouth daily. 12/14/21  Yes Horald Pollen, MD  ?Alcohol Swabs PADS Test blood sugar once daily. Dx: E11.9 ?Patient not taking: Reported on 03/14/2022 01/23/22   Horald Pollen, MD  ?brinzolamide (AZOPT) 1 % ophthalmic suspension Place 1 drop into the left eye 2 (two) times daily. ?Patient not taking: Reported on 12/14/2021    [provider]  ? ? ?Allergies  ?Allergen Reactions  ? Contrast Media [Iodinated Contrast Media]   ? Iohexol Other (See Comments)  ?  " made me feel like I was burning inside"  ? ? ?Patient Active Problem List  ? Diagnosis Date Noted  ? Diabetes ([place]) 09/25/2021  ? Achilles tendonitis 09/23/2021  ? Stage 3a chronic kidney disease ([place]) 11/08/2020  ? 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 Retinal telangiectasia of left eye 05/18/2020  ? Cystoid macular edema of left eye 05/18/2020  ? Hyperuricemia 04/27/2020  ? Gastroesophageal reflux disease 11/23/2018  ? DDD (degenerative disc disease), cervical 07/11/2017  ? Localized osteoarthrosis of right shoulder region 07/11/2017  ? Anemia 07/11/2017  ? Hyperlipidemia 06/28/2016  ? Thyroid disease 06/28/2016  ? Diabetic retinopathy ([place]) 05/05/2015  ? Class 2 severe obesity due to excess calories with serious comorbidity and body mass index (BMI) of 38.0 to 38.9 in adult [facility]) 01/11/2015  ? Glaucoma 04/06/2014  ? Macular degeneration 04/06/2014  ? 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 Hypertension associated with diabetes ([place]) 10/02/2013  ? Sarcoidosis 10/02/2013  ? Dyslipidemia associated with type 2 diabetes mellitus ([place]) 10/02/2013  ? OSA (obstructive sleep apnea) 10/23/2012  ? ? ?Past Medical History:  ?Diagnosis Date  ? Cataract   ? DDD (degenerative disc disease), cervical 07/11/2017  ? C5-6 and C6-7 cervical spondylosis and degenerative disc disease on 2005 Xray  ? Diverticulosis   ? GERD (gastroesophageal reflux disease)   ? Glaucoma 04/06/2014  ? HTN (hypertension)   ?  Hyperlipidemia   ? 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 Iron deficiency anemia   ? Pulmonary sarcoidosis ([place])   ? Sleep apnea   ? uses CPAP  ? Thyroid disease   ? 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 ? ?Past Surgical History:  ?Procedure Laterality Date  ? ABDOMINAL HYSTERECTOMY N/A   ? [facility] 11/14/2020  ? APPENDECTOMY    ? c-section    ? x 2  ? CATARACT EXTRACTION    ? bilateral  ? CESAREAN SECTION N/A   ? [facility] 11/14/2020  ? COLONOSCOPY    ? EYE SURGERY N/A   ? [facility] 11/14/2020  ? SCLERAL BUCKLE    ? right  ? TONSILLECTOMY    ? TOTAL ABDOMINAL HYSTERECTOMY    ? TUBAL LIGATION    ? VITRECTOMY    ? bilateral  ? ? ?Social History  ? ?Socioeconomic History  ? Marital status: Married  ?  Spouse name: Not on file  ? Number of children: 2  ? Years of education: Not on file  ? Highest education level: Not on file  ?Occupational History  ? Occupation: behavioral health  ?Tobacco Use  ? Smoking status: Former  ?  Packs/day: 0.20  ?  Years: 1.00  ?  Pack years: 0.20  ?  Types: Cigarettes  ?  Quit date: 12/11/1968  ?  Years since quitting: 53.2  ? Smokeless tobacco: Never  ?Vaping Use  ? Vaping Use: Never used  ?Substance and Sexual Activity  ? Alcohol use: No  ? Drug use: No  ? Sexual activity: Not on file  ?Other Topics Concern  ? Not on file  ?Social History Narrative  ? Not on file  ? ?Social Determinants of Health  ? ?Financial Resource Strain: Not on file  ?Food Insecurity: Not on file  ?Transportation Needs: Not on file  ?Physical Activity: Not on file  ?Stress: Not on file  ?Social Connections: Not on file  ?Intimate Partner Violence: Not on file  ? ? ?Family History  ?Problem Relation Age of Onset  ? Colon polyps Mother   ?     brothers x2  ? Clotting disorder Mother   ?     brother x 2, MGM  ? Heart disease Brother   ?     mother, MGM  ? Diabetes Brother   ?     x 2, MGM  ? Stroke Maternal Grandmother   ? Colon cancer Neg Hx   ? Breast  cancer Neg Hx   ? Esophageal cancer Neg Hx   ? Rectal cancer Neg Hx   ? Stomach cancer Neg Hx   ? ? ? ?Review of Systems  ?Constitutional: Negative.  Negative for chills and fever.  ?HENT: Negative.  Negative for congestion and sore throat.   ?Eyes: Negative.   ?Respiratory: Negative.  Negative for cough and shortness of breath.   ?Cardiovascular: Negative.  Negative  for chest pain and palpitations.  ?Gastrointestinal:  Negative for abdominal pain, nausea and vomiting.  ?Genitourinary: Negative.   ?Skin: Negative.  Negative for rash.  ?Neurological: Negative.  Negative for dizziness and headaches.  ?All other systems reviewed and are negative. ? ?Today's Vitals  ? 03/14/22 1448  ?BP: 134/86  ?Pulse: 75  ?Temp: 98 ?F (36.7 ?C)  ?SpO2: 98%  ?Weight: 212 lb 6 oz (96.3 kg)  ?Height: 5' 3"  (1.6 m)  ? ?Body mass index is 37.62 kg/m?. ?BP Readings from Last 3 Encounters:  ?03/14/22 134/86  ?12/22/21 131/81  ?12/14/21 (!) 144/82  ? ?Lab Results  ?Component Value Date  ? CHOL 156 05/31/2021  ? HDL 61.80 05/31/2021  ? [place] 78 05/31/2021  ? TRIG 80.0 05/31/2021  ? CHOLHDL 3 05/31/2021  ? ?Wt Readings from Last 3 Encounters:  ?03/14/22 212 lb 6 oz (96.3 kg)  ?12/22/21 208 lb (94.3 kg)  ?12/14/21 211 lb (95.7 kg)  ? ? ?Physical Exam ?Vitals reviewed.  ?Constitutional:   ?   Appearance: Normal appearance.  ?HENT:  ?   Head: Normocephalic.  ?   Mouth/Throat:  ?   Mouth: Mucous membranes are moist.  ?   Pharynx: Oropharynx is clear.  ?Eyes:  ?   Extraocular Movements: Extraocular movements intact.  ?   Conjunctiva/sclera: Conjunctivae normal.  ?   Pupils: Pupils are equal, round, and reactive to light.  ?Cardiovascular:  ?   Rate and Rhythm: Normal rate and regular rhythm.  ?   Pulses: Normal pulses.  ?   Heart sounds: Normal heart sounds.  ?Pulmonary:  ?   Effort: Pulmonary effort is normal.  ?   Breath sounds: Normal breath sounds.  ?Musculoskeletal:  ?   Cervical back: No tenderness.  ?   Comments: Feet: Warm to touch.  No  erythema or ecchymosis.  No swelling.  No tenderness.  Excellent peripheral pulses and capillary refill.  Good distal sensation.  Full range of motion.  No abnormal findings.  ?Lymphadenopathy:  ?   Cervical: N

## 2022-03-14 NOTE — Assessment & Plan Note (Signed)
Well-controlled hypertension.  Continue valsartan 80 mg daily. ?BP Readings from Last 3 Encounters:  ?03/14/22 134/86  ?12/22/21 131/81  ?12/14/21 (!) 144/82  ?Well-controlled diabetes. ?Continue Farxiga 10 mg daily. ?Diet and nutrition discussed. ? ?

## 2022-03-14 NOTE — Patient Instructions (Signed)
Mantenimiento de la salud despu?s de los 65 a?os de edad ?Health Maintenance After Age 69 ?Despu?s de los 65 a?os de edad, corre un riesgo mayor de padecer ciertas enfermedades e infecciones a largo plazo, como tambi?n de sufrir lesiones por ca?das. Las ca?das son la causa principal de las fracturas de huesos y lesiones en la cabeza de personas mayores de 65 a?os de edad. Recibir cuidados preventivos de forma regular puede ayudarlo a mantenerse saludable y en buen estado. Los cuidados preventivos incluyen realizarse an?lisis de forma regular y realizar cambios en el estilo de vida seg?n las recomendaciones del m?dico. Converse con el m?dico sobre lo siguiente: ?Las pruebas de detecci?n y los an?lisis que debe realizarse. Una prueba de detecci?n es un estudio que se para detectar la presencia de una enfermedad cuando no tiene s?ntomas. ?Un plan de dieta y ejercicios adecuado para usted. ??Qu? debo saber sobre las pruebas de detecci?n y los an?lisis para prevenir ca?das? ?Realizarse pruebas de detecci?n y an?lisis es la mejor manera de detectar un problema de salud de forma temprana. El diagn?stico y tratamiento tempranos le brindan la mejor oportunidad de controlar las afecciones m?dicas que son comunes despu?s de los 65 a?os de edad. Ciertas afecciones y elecciones de estilo de vida pueden hacer que sea m?s propenso a sufrir una ca?da. El m?dico puede recomendarle lo siguiente: ?Controles regulares de la visi?n. Una visi?n deficiente y afecciones como las cataratas pueden hacer que sea m?s propenso a sufrir una ca?da. Si usa lentes, aseg?rese de obtener una receta actualizada si su visi?n cambia. ?Revisi?n de medicamentos. Revise regularmente con el m?dico todos los medicamentos que toma, incluidos los medicamentos de venta libre. Consulte al m?dico sobre los efectos secundarios que pueden hacer que sea m?s propenso a sufrir una ca?da. Informe al m?dico si alguno de los medicamentos que toma lo hace sentir mareado o  somnoliento. ?Controles de fuerza y equilibrio. El m?dico puede recomendar ciertos estudios para controlar su fuerza y equilibrio al estar de pie, al caminar o al cambiar de posici?n. ?Examen de los pies. El dolor y el adormecimiento en los pies, como tambi?n no utilizar el calzado adecuado, pueden hacer que sea m?s propenso a sufrir una ca?da. ?Pruebas de detecci?n, que incluyen las siguientes: ?Pruebas de detecci?n para la osteoporosis. La osteoporosis es una afecci?n que hace que los huesos se tornen m?s d?biles y se quiebren con m?s facilidad. ?Pruebas de detecci?n para la presi?n arterial. Los cambios en la presi?n arterial y los medicamentos para controlar la presi?n arterial pueden hacerlo sentir mareado. ?Prueba de detecci?n de la depresi?n. Es m?s probable que sufra una ca?da si tiene miedo a caerse, se siente deprimido o se siente incapaz de realizar actividades que sol?a hacer. ?Prueba de detecci?n de consumo de alcohol. Beber demasiado alcohol puede afectar su equilibrio y puede hacer que sea m?s propenso a sufrir una ca?da. ?Siga estas indicaciones en su casa: ?Estilo de vida ?No beba alcohol si: ?Su m?dico le indica no hacerlo. ?Si bebe alcohol: ?Limite la cantidad que bebe a lo siguiente: ?De 0 a 1 medida por d?a para las mujeres. ?De 0 a 2 medidas por d?a para los hombres. ?Sepa cu?nta cantidad de alcohol hay en las bebidas que toma. En [place], una medida equivale a una botella de cerveza de 12 oz (355 ml), un vaso de vino de 5 oz (148 ml) o un vaso de una bebida alcoh?lica de alta graduaci?n de 1? oz (44 ml). ?No consuma ning?n producto que   contenga nicotina o tabaco. Estos productos incluyen cigarrillos, tabaco para mascar y aparatos de vapeo, como los cigarrillos electr?nicos. Si necesita ayuda para dejar de consumir estos productos, consulte al m?dico. ?Actividad ? ?Siga un programa de ejercicio regular para mantenerse en forma. Esto lo ayudar? a mantener el equilibrio. Consulte al  m?dico qu? tipos de ejercicios son adecuados para usted. ?Si necesita un bast?n o un andador, ?selo seg?n las recomendaciones del m?dico. ?Utilice calzado con buen apoyo y suela antideslizante. ?Seguridad ? ?Retire los objetos que puedan causar tropiezos tales como alfombras, cables u obst?culos. ?Instale equipos de seguridad, como barras para sost?n en los ba?os y barandas de seguridad en las escaleras. ?Mantenga las habitaciones y los pasillos bien iluminados. ?Indicaciones generales ?Hable con el m?dico sobre sus riesgos de sufrir una ca?da. Inf?rmele a su m?dico si: ?Se cae. Aseg?rese de informarle a su m?dico acerca de todas las ca?das, incluso aquellas que parecen ser menores. ?Se siente mareado, cansado (tiene fatiga) o siente que pierde el equilibrio. ?Use los medicamentos de venta libre y los recetados solamente como se lo haya indicado el m?dico. Estos incluyen suplementos. ?Siga una dieta sana y mantenga un peso saludable. Una dieta saludable incluye productos l?cteos descremados, carnes bajas en contenido de grasa (magras), fibra de granos enteros, frijoles y muchas frutas y verduras. ?Mant?ngase al d?a con las vacunas. ?Real?cese los estudios de rutina de la salud, dentales y de la vista. ?Resumen ?Tener un estilo de vida saludable y recibir cuidados preventivos pueden ayudar a promover la salud y el bienestar despu?s de los 65 a?os de edad. ?Realizarse pruebas de detecci?n y an?lisis es la mejor manera de detectar un problema de salud de forma temprana y ayudarlo a evitar una ca?da. El diagn?stico y tratamiento tempranos le brindan la mejor oportunidad de controlar las afecciones m?dicas m?s comunes en las personas mayores de 65 a?os de edad. ?Las ca?das son la causa principal de las fracturas de huesos y lesiones en la cabeza de personas mayores de 65 a?os de edad. Tome precauciones para evitar una ca?da en su casa. ?Trabaje con el m?dico para saber qu? cambios que puede hacer para mejorar su salud y  bienestar, y para prevenir las ca?das. ?Esta informaci?n no tiene como fin reemplazar el consejo del m?dico. Aseg?rese de hacerle al m?dico cualquier pregunta que tenga. ?Document Revised: 05/04/2021 Document Reviewed: 05/04/2021 ?Elsevier Patient Education ? 2022 [employer] Inc. ? ?

## 2022-03-14 NOTE — Assessment & Plan Note (Signed)
Stable.  Continue rosuvastatin 40 mg daily. ?The 10-year ASCVD risk score (Arnett DK, et al., 2019) is: 21.2% ?  Values used to calculate the score: ?    Age: 71 years ?    Sex: Female ?    Is Non-Hispanic African American: Yes ?    Diabetic: Yes ?    Tobacco smoker: No ?    Systolic Blood Pressure: [id number] mmHg ?    Is BP treated: Yes ?    HDL Cholesterol: 61.8 mg/dL ?    Total Cholesterol: 156 mg/dL ?Diet and nutrition discussed.  Lipid profile done today. ?

## 2022-03-14 NOTE — Assessment & Plan Note (Signed)
Stable.  Continue Farxiga 10 mg daily.  Advised to stay well-hydrated and to avoid NSAIDs. ?

## 2022-03-24 ENCOUNTER — Ambulatory Visit (INDEPENDENT_AMBULATORY_CARE_PROVIDER_SITE_OTHER): Payer: Medicare HMO

## 2022-03-24 DIAGNOSIS — Z0000 Encounter for general adult medical examination without abnormal findings: Secondary | ICD-10-CM

## 2022-03-24 NOTE — Patient Instructions (Signed)
Eileen Jefferson , ?Thank you for taking time to come for your Medicare Wellness Visit. I appreciate your ongoing commitment to your health goals. Please review the following plan we discussed and let me know if I can assist you in the future.  ? ?Screening recommendations/referrals: ?Colonoscopy: 12/22/2021  ?Mammogram: 05/20/2021 ?Bone Density: 06/05/2018 ?Recommended yearly ophthalmology/optometry visit for glaucoma screening and checkup ?Recommended yearly dental visit for hygiene and checkup ? ?Vaccinations: ?Influenza vaccine: completed  ?Pneumococcal vaccine: completed  ?Tdap vaccine: 01/11/2015 ?Shingles vaccine: will consider    ? ?Advanced directives: none  ? ?Conditions/risks identified: none  ? ?Next appointment: none ? ? ?Preventive Care 69 Years and Older, Female ?Preventive care refers to lifestyle choices and visits with your health care provider that can promote health and wellness. ?What does preventive care include? ?A yearly physical exam. This is also called an annual well check. ?Dental exams once or twice a year. ?Routine eye exams. Ask your health care provider how often you should have your eyes checked. ?Personal lifestyle choices, including: ?Daily care of your teeth and gums. ?Regular physical activity. ?Eating a healthy diet. ?Avoiding tobacco and drug use. ?Limiting alcohol use. ?Practicing safe sex. ?Taking low-dose aspirin every day. ?Taking vitamin and mineral supplements as recommended by your health care provider. ?What happens during an annual well check? ?The services and screenings done by your health care provider during your annual well check will depend on your age, overall health, lifestyle risk factors, and family history of disease. ?Counseling  ?Your health care provider may ask you questions about your: ?Alcohol use. ?Tobacco use. ?Drug use. ?Emotional well-being. ?Home and relationship well-being. ?Sexual activity. ?Eating habits. ?History of falls. ?Memory and ability to  understand (cognition). ?Work and work Astronomer. ?Reproductive health. ?Screening  ?You may have the following tests or measurements: ?Height, weight, and BMI. ?Blood pressure. ?Lipid and cholesterol levels. These may be checked every 5 years, or more frequently if you are over 22 years old. ?Skin check. ?Lung cancer screening. You may have this screening every year starting at age 36 if you have a 30-pack-year history of smoking and currently smoke or have quit within the past 15 years. ?Fecal occult blood test (FOBT) of the stool. You may have this test every year starting at age 15. ?Flexible sigmoidoscopy or colonoscopy. You may have a sigmoidoscopy every 5 years or a colonoscopy every 10 years starting at age 69. ?Hepatitis C blood test. ?Hepatitis B blood test. ?Sexually transmitted disease (STD) testing. ?Diabetes screening. This is done by checking your blood sugar (glucose) after you have not eaten for a while (fasting). You may have this done every 1-3 years. ?Bone density scan. This is done to screen for osteoporosis. You may have this done starting at age 80. ?Mammogram. This may be done every 1-2 years. Talk to your health care provider about how often you should have regular mammograms. ?Talk with your health care provider about your test results, treatment options, and if necessary, the need for more tests. ?Vaccines  ?Your health care provider may recommend certain vaccines, such as: ?Influenza vaccine. This is recommended every year. ?Tetanus, diphtheria, and acellular pertussis (Tdap, Td) vaccine. You may need a Td booster every 10 years. ?Zoster vaccine. You may need this after age 69. ?Pneumococcal 13-valent conjugate (PCV13) vaccine. One dose is recommended after age 47. ?Pneumococcal polysaccharide (PPSV23) vaccine. One dose is recommended after age 70. ?Talk to your health care provider about which screenings and vaccines you need and how often  you need them. ?This information is not  intended to replace advice given to you by your health care provider. Make sure you discuss any questions you have with your health care provider. ?Document Released: 12/24/2015 Document Revised: 08/16/2016 Document Reviewed: 09/28/2015 ?Elsevier Interactive Patient Education ? 2017 [employer]. ? ?Fall Prevention in the Home ?Falls can cause injuries. They can happen to people of all ages. There are many things you can do to make your home safe and to help prevent falls. ?What can I do on the outside of my home? ?Regularly fix the edges of walkways and driveways and fix any cracks. ?Remove anything that might make you trip as you walk through a door, such as a raised step or threshold. ?Trim any bushes or trees on the path to your home. ?Use bright outdoor lighting. ?Clear any walking paths of anything that might make someone trip, such as rocks or tools. ?Regularly check to see if handrails are loose or broken. Make sure that both sides of any steps have handrails. ?Any raised decks and porches should have guardrails on the edges. ?Have any leaves, snow, or ice cleared regularly. ?Use sand or salt on walking paths during winter. ?Clean up any spills in your garage right away. This includes oil or grease spills. ?What can I do in the bathroom? ?Use night lights. ?Install grab bars by the toilet and in the tub and shower. Do not use towel bars as grab bars. ?Use non-skid mats or decals in the tub or shower. ?If you need to sit down in the shower, use a plastic, non-slip stool. ?Keep the floor dry. Clean up any water that spills on the floor as soon as it happens. ?Remove soap buildup in the tub or shower regularly. ?Attach bath mats securely with double-sided non-slip rug tape. ?Do not have throw rugs and other things on the floor that can make you trip. ?What can I do in the bedroom? ?Use night lights. ?Make sure that you have a light by your bed that is easy to reach. ?Do not use any sheets or blankets that are  too big for your bed. They should not hang down onto the floor. ?Have a firm chair that has side arms. You can use this for support while you get dressed. ?Do not have throw rugs and other things on the floor that can make you trip. ?What can I do in the kitchen? ?Clean up any spills right away. ?Avoid walking on wet floors. ?Keep items that you use a lot in easy-to-reach places. ?If you need to reach something above you, use a strong step stool that has a grab bar. ?Keep electrical cords out of the way. ?Do not use floor polish or wax that makes floors slippery. If you must use wax, use non-skid floor wax. ?Do not have throw rugs and other things on the floor that can make you trip. ?What can I do with my stairs? ?Do not leave any items on the stairs. ?Make sure that there are handrails on both sides of the stairs and use them. Fix handrails that are broken or loose. Make sure that handrails are as Retter as the stairways. ?Check any carpeting to make sure that it is firmly attached to the stairs. Fix any carpet that is loose or worn. ?Avoid having throw rugs at the top or bottom of the stairs. If you do have throw rugs, attach them to the floor with carpet tape. ?Make sure that you have a light  switch at the top of the stairs and the bottom of the stairs. If you do not have them, ask someone to add them for you. ?What else can I do to help prevent falls? ?Wear shoes that: ?Do not have high heels. ?Have rubber bottoms. ?Are comfortable and fit you well. ?Are closed at the toe. Do not wear sandals. ?If you use a stepladder: ?Make sure that it is fully opened. Do not climb a closed stepladder. ?Make sure that both sides of the stepladder are locked into place. ?Ask someone to hold it for you, if possible. ?Clearly mark and make sure that you can see: ?Any grab bars or handrails. ?First and last steps. ?Where the edge of each step is. ?Use tools that help you move around (mobility aids) if they are needed. These  include: ?Canes. ?Walkers. ?Scooters. ?Crutches. ?Turn on the lights when you go into a dark area. Replace any light bulbs as soon as they burn out. ?Set up your furniture so you have a clear path. Avoid moving your fu

## 2022-03-24 NOTE — Progress Notes (Signed)
? ?Subjective:  ? Eileen Jefferson is a 71 y.o. female who presents for Medicare Annual (Subsequent) preventive examination. ? ? ?I connected with Kara Mead today by telephone and verified that I am speaking with the correct person using two identifiers. ?Location patient: home ?Location provider: work ?Persons participating in the virtual visit: patient, provider. ?  ?I discussed the limitations, risks, security and privacy concerns of performing an evaluation and management service by telephone and the availability of in person appointments. I also discussed with the patient that there may be a patient responsible charge related to this service. The patient expressed understanding and verbally consented to this telephonic visit.  ?  ?Interactive audio and video telecommunications were attempted between this provider and patient, however failed, due to patient having technical difficulties OR patient did not have access to video capability.  We continued and completed visit with audio only. ? ?  ?Review of Systems    ? ?Cardiac Risk Factors include: advanced age (>46mn, >>17women);diabetes mellitus;dyslipidemia;hypertension ? ?   ?Objective:  ?  ?Today's Vitals  ? ?There is no height or weight on file to calculate BMI. ? ? ?  03/24/2022  ?  8:21 AM 11/15/2020  ?  9:02 AM 08/16/2018  ?  6:38 AM  ?Advanced Directives  ?Does Patient Have a Medical Advance Directive? No No No  ?Would patient like information on creating a medical advance directive? No - Patient declined No - Patient declined Yes (ED - Information included in AVS)  ? ? ?Current Medications (verified) ?Outpatient Encounter Medications as of 03/24/2022  ?Medication Sig  ? Accu-Chek Softclix Lancets lancets Test blood sugar once daily. Dx: E11.9  ? Alcohol Swabs PADS Test blood sugar once daily. Dx: E11.9  ? allopurinol (ZYLOPRIM) 100 MG tablet TAKE 1 TABLET BY MOUTH EVERY DAY  ? aspirin EC 81 MG tablet Take 81 mg by mouth daily.  ? Blood Glucose  Calibration (ACCU-CHEK AVIVA) SOLN Test blood sugar once daily. Dx: E11.9  ? Blood Glucose Monitoring Suppl (ACCU-CHEK AVIVA PLUS) w/Device KIT Test blood sugar once daily. Dx: E11.9  ? colchicine 0.6 MG tablet Take 1 tablet (0.6 mg total) by mouth daily as needed (gout or psuedogout pain).  ? famotidine (PEPCID) 40 MG tablet TAKE 1 TABLET (40 MG TOTAL) BY MOUTH 2  TIMES DAILY AS NEEDED FOR HEARTBURN OR INDIGESTION.  ? glucose blood (ACCU-CHEK AVIVA PLUS) test strip Test blood sugar once daily. Dx: E11.9  ? omeprazole (PRILOSEC) 40 MG capsule TAKE 1 CAPSULE (40 MG TOTAL) DAILY 30-60 MINUTES BEFORE BREAKFAST  ? rosuvastatin (CRESTOR) 40 MG tablet TAKE 1 TABLET EVERY DAY  ? valsartan (DIOVAN) 80 MG tablet Take 1 tablet (80 mg total) by mouth daily.  ? ?No facility-administered encounter medications on file as of 03/24/2022.  ? ? ?Allergies (verified) ?Contrast media [iodinated contrast media] and Iohexol  ? ?History: ?Past Medical History:  ?Diagnosis Date  ? Cataract   ? DDD (degenerative disc disease), cervical 07/11/2017  ? C5-6 and C6-7 cervical spondylosis and degenerative disc disease on 2005 Xray  ? Diverticulosis   ? GERD (gastroesophageal reflux disease)   ? Glaucoma 04/06/2014  ? HTN (hypertension)   ? Hyperlipidemia   ? 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 Iron deficiency anemia   ? Pulmonary sarcoidosis ([facility]   ? Sleep apnea   ? uses CPAP  ? Thyroid disease   ? 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 ?  Past Surgical History:  ?Procedure Laterality Date  ? ABDOMINAL HYSTERECTOMY N/A   ? [facility] 11/14/2020  ? APPENDECTOMY    ? c-section    ? x 2  ? CATARACT EXTRACTION    ? bilateral  ? CESAREAN SECTION N/A   ? [facility] 11/14/2020  ? COLONOSCOPY    ? EYE SURGERY N/A   ? [facility] 11/14/2020  ? SCLERAL BUCKLE    ? right  ? TONSILLECTOMY    ? TOTAL ABDOMINAL HYSTERECTOMY    ? TUBAL LIGATION    ? VITRECTOMY    ? bilateral  ? ?Family History   ?Problem Relation Age of Onset  ? Colon polyps Mother   ?     brothers x2  ? Clotting disorder Mother   ?     brother x 2, MGM  ? Heart disease Brother   ?     mother, MGM  ? Diabetes Brother   ?     x 2, MGM  ? Stroke Maternal Grandmother   ? Colon cancer Neg Hx   ? Breast cancer Neg Hx   ? Esophageal cancer Neg Hx   ? Rectal cancer Neg Hx   ? Stomach cancer Neg Hx   ? ?Social History  ? ?Socioeconomic History  ? Marital status: Married  ?  Spouse name: Not on file  ? Number of children: 2  ? Years of education: Not on file  ? Highest education level: Not on file  ?Occupational History  ? Occupation: behavioral health  ?Tobacco Use  ? Smoking status: Former  ?  Packs/day: 0.20  ?  Years: 1.00  ?  Pack years: 0.20  ?  Types: Cigarettes  ?  Quit date: 12/11/1968  ?  Years since quitting: 53.3  ? Smokeless tobacco: Never  ?Vaping Use  ? Vaping Use: Never used  ?Substance and Sexual Activity  ? Alcohol use: No  ? Drug use: No  ? Sexual activity: Not on file  ?Other Topics Concern  ? Not on file  ?Social History Narrative  ? Not on file  ? ?Social Determinants of Health  ? ?Financial Resource Strain: Low Risk   ? Difficulty of Paying Living Expenses: Not hard at all  ?Food Insecurity: No Food Insecurity  ? Worried About Charity fundraiser in the Last Year: Never true  ? Ran Out of Food in the Last Year: Never true  ?Transportation Needs: No Transportation Needs  ? Lack of Transportation (Medical): No  ? Lack of Transportation (Non-Medical): No  ?Physical Activity: Inactive  ? Days of Exercise per Week: 0 days  ? Minutes of Exercise per Session: 0 min  ?Stress: No Stress Concern Present  ? Feeling of Stress : Not at all  ?Social Connections: Socially Integrated  ? Frequency of Communication with Friends and Family: Twice a week  ? Frequency of Social Gatherings with Friends and Family: Twice a week  ? Attends Religious Services: More than 4 times per year  ? Active Member of Clubs or Organizations: Yes  ? Attends Theatre manager Meetings: 1 to 4 times per year  ? Marital Status: Married  ? ? ?Tobacco Counseling ?Counseling given: Not Answered ? ? ?Clinical Intake: ? ?Pre-visit preparation completed: Yes ? ?Pain : No/denies pain ? ?  ? ?Nutritional Risks: None ?Diabetes: Yes ?CBG done?: No ?Did pt. bring in CBG monitor from home?: No ? ?How often do you need to have someone help you when you read instructions, pamphlets,  or other written materials from your doctor or pharmacy?: 1 - Never ?What is the last grade level you completed in school?: College ? ?Diabetic?yes ?Nutrition Risk Assessment: ? ?Has the patient had any N/V/D within the last 2 months?  No  ?Does the patient have any non-healing wounds?  No  ?Has the patient had any unintentional weight loss or weight gain?  No  ? ?Diabetes: ? ?Is the patient diabetic?  Yes  ?If diabetic, was a CBG obtained today?  No  ?Did the patient bring in their glucometer from home?  No  ?How often do you monitor your CBG's? Daily .  ? ?Financial Strains and Diabetes Management: ? ?Are you having any financial strains with the device, your supplies or your medication? No .  ?Does the patient want to be seen by Chronic Care Management for management of their diabetes?  No  ?Would the patient like to be referred to a Nutritionist or for Diabetic Management?  No  ? ?Diabetic Exams: ? ?Diabetic Eye Exam: Completed 02/2022 ?Diabetic Foot Exam: Overdue, Pt has been advised about the importance in completing this exam. Pt is scheduled for diabetic foot exam on next office visit .  ? ?Interpreter Needed?: No ? ?Information entered by :: [email or web address] ? ? ?Activities of Daily Living ? ?  03/24/2022  ?  8:25 AM  ?In your present state of health, do you have any difficulty performing the following activities:  ?Hearing? 0  ?Vision? 0  ?Difficulty concentrating or making decisions? 0  ?Walking or climbing stairs? 0  ?Dressing or bathing? 0  ?Doing errands, shopping? 0  ?Preparing Food and eating ? N   ?Using the Toilet? N  ?In the past six months, have you accidently leaked urine? N  ?Do you have problems with loss of bowel control? N  ?Managing your Medications? N  ?Managing your Finances? N  ?Housekeeping or

## 2022-04-10 ENCOUNTER — Telehealth: Payer: Self-pay | Admitting: *Deleted

## 2022-04-10 ENCOUNTER — Other Ambulatory Visit: Payer: Self-pay | Admitting: Emergency Medicine

## 2022-04-10 ENCOUNTER — Telehealth: Payer: Self-pay | Admitting: Emergency Medicine

## 2022-04-10 DIAGNOSIS — E790 Hyperuricemia without signs of inflammatory arthritis and tophaceous disease: Secondary | ICD-10-CM

## 2022-04-10 MED ORDER — ALLOPURINOL 100 MG PO TABS: 100.0000 mg | ORAL_TABLET | Freq: Every day | ORAL | 3 refills | Status: DC

## 2022-04-10 MED ORDER — ALLOPURINOL 100 MG PO TABS: 100.0000 mg | ORAL_TABLET | Freq: Every day | ORAL | 0 refills | Status: DC

## 2022-04-10 NOTE — Telephone Encounter (Signed)
Prescription sent to pharmacy on file. 

## 2022-04-10 NOTE — Telephone Encounter (Signed)
Prescription sent to pharmacy requested.  Thanks.

## 2022-04-10 NOTE — Telephone Encounter (Signed)
Patient is calling to get her allopurinol 100 mg. refilled - please send it to  [facility]) pharmacy ?

## 2022-05-14 NOTE — Progress Notes (Unsigned)
HPI F former smoker followed for OSA, complicated by HBP, GERD, DM2 with retinopathy, Macular degeneration, Cervical DDD, Sarcoid/ Iridocyclitis, Glaucoma, Obesity, PSG 10/11/04>>AHI 16.7, SpO2 low 85% PSG 10/31/12>>AHI 12.9, REM 44.8, SpO2 low 59%, PLMI 0.  HST 01/08/20- AHI 17.1/ hr, desaturation to 70%, body weight 211 lbs  -----------------------------------------------------------------------------------------------  05/16/21- 70 yoF former smoker followed for OSA, complicated by HTN, GERD, DM2 with retinopathy, Macular degeneration, Cervical DDD, Sarcoid/ Iridocyclitis, Glaucoma, Obesity, CPAP auto 5-20/ [facility] Download-compliance 87%, AHI 0.4/ hr Body weight today-207 lbs Covid vax- 4 [facility] Says Dr Luciana Axe is pleased with the way her eyes look now, using CPAP.  She feels better rested.   05/16/22- 40 yoF former smoker followed for OSA, complicated by HTN, GERD, DM2 with retinopathy, Macular degeneration, Cervical DDD, Sarcoid/ Iridocyclitis, Glaucoma, Obesity, CPAP auto 5-20/ [facility] Download-compliance r Body weight today- Covid vax- 4 [facility]   ROS-see HPI   + = positive Constitutional:    weight loss, night sweats, fevers, chills, fatigue, lassitude. HEENT:    headaches, difficulty swallowing, tooth/dental problems, sore throat,       sneezing, itching, ear ache, nasal congestion, post nasal drip, snoring CV:    chest pain, orthopnea, PND, swelling in lower extremities, anasarca,                                   dizziness, palpitations Resp:   shortness of breath with exertion or at rest.                productive cough,   non-productive cough, coughing up of blood.              change in color of mucus.  wheezing.   Skin:    rash or lesions. GI:    +heartburn, indigestion, abdominal pain, nausea, vomiting, diarrhea,                 change in bowel habits, loss of appetite GU: dysuria, change in color of urine, no urgency or frequency.   flank pain. MS:   joint pain,  stiffness, decreased range of motion, back pain. Neuro-     nothing unusual Psych:  change in mood or affect.  depression or anxiety.   memory loss.  OBJ- Physical Exam General- Alert, Oriented, Affect-appropriate, Distress- none acute, + obese Skin- rash-none, lesions- none, excoriation- none Lymphadenopathy- none Head- atraumatic            Eyes- Gross vision intact, PERRLA, conjunctivae and secretions clear            Ears- Hearing, canals-normal            Nose- Clear, no-Septal dev, mucus, polyps, erosion, perforation             Throat- Mallampati IV , mucosa clear , drainage- none, tonsils- atrophic,  + teeth Neck- flexible , trachea midline, no stridor , thyroid nl, carotid no bruit Chest - symmetrical excursion , unlabored           Heart/CV- RRR , no murmur , no gallop  , no rub, nl s1 s2                           - JVD- none , edema- none, stasis changes- none, varices- none           Lung- clear to P&A, wheeze- none, cough- none , dullness-none,  rub- none           Chest wall-  Abd-  Br/ Gen/ Rectal- Not done, not indicated Extrem- cyanosis- none, clubbing, none, atrophy- none, strength- nl Neuro- grossly intact to observation

## 2022-05-16 ENCOUNTER — Ambulatory Visit: Payer: Medicare HMO | Admitting: Internal Medicine

## 2022-05-16 ENCOUNTER — Encounter: Payer: Self-pay | Admitting: Internal Medicine

## 2022-05-16 DIAGNOSIS — Z6838 Body mass index (BMI) 38.0-38.9, adult: Secondary | ICD-10-CM

## 2022-05-16 DIAGNOSIS — G4733 Obstructive sleep apnea (adult) (pediatric): Secondary | ICD-10-CM

## 2022-05-16 NOTE — Assessment & Plan Note (Signed)
She has gained a few pounds since last year. Plan-continue goal of exercise, diet towards normal body weight

## 2022-05-16 NOTE — Assessment & Plan Note (Signed)
She continues to benefit from CPAP with good compliance and control. Plan-continue auto 5-20

## 2022-05-16 NOTE — Patient Instructions (Signed)
We can continue CPAP auto 5-20  Please call if we can help 

## 2022-05-25 DIAGNOSIS — H47233 Glaucomatous optic atrophy, bilateral: Secondary | ICD-10-CM | POA: Diagnosis not present

## 2022-05-25 DIAGNOSIS — H35033 Hypertensive retinopathy, bilateral: Secondary | ICD-10-CM | POA: Diagnosis not present

## 2022-05-25 DIAGNOSIS — H35072 Retinal telangiectasis, left eye: Secondary | ICD-10-CM | POA: Diagnosis not present

## 2022-05-25 DIAGNOSIS — D869 Sarcoidosis, unspecified: Secondary | ICD-10-CM | POA: Diagnosis not present

## 2022-05-25 DIAGNOSIS — G4733 Obstructive sleep apnea (adult) (pediatric): Secondary | ICD-10-CM | POA: Diagnosis not present

## 2022-05-25 DIAGNOSIS — E113313 Type 2 diabetes mellitus with moderate nonproliferative diabetic retinopathy with macular edema, bilateral: Secondary | ICD-10-CM | POA: Diagnosis not present

## 2022-05-25 DIAGNOSIS — H35071 Retinal telangiectasis, right eye: Secondary | ICD-10-CM | POA: Diagnosis not present

## 2022-05-25 DIAGNOSIS — H35342 Macular cyst, hole, or pseudohole, left eye: Secondary | ICD-10-CM | POA: Diagnosis not present

## 2022-05-25 DIAGNOSIS — H401132 Primary open-angle glaucoma, bilateral, moderate stage: Secondary | ICD-10-CM | POA: Diagnosis not present

## 2022-05-30 ENCOUNTER — Encounter: Payer: Self-pay | Admitting: Internal Medicine

## 2022-05-30 ENCOUNTER — Ambulatory Visit (INDEPENDENT_AMBULATORY_CARE_PROVIDER_SITE_OTHER): Payer: Medicare HMO | Admitting: Internal Medicine

## 2022-05-30 VITALS — BP 138/76 | HR 103 | Temp 100.9°F | Ht 62.0 in | Wt 214.2 lb

## 2022-05-30 DIAGNOSIS — I152 Hypertension secondary to endocrine disorders: Secondary | ICD-10-CM | POA: Diagnosis not present

## 2022-05-30 DIAGNOSIS — E1159 Type 2 diabetes mellitus with other circulatory complications: Secondary | ICD-10-CM | POA: Diagnosis not present

## 2022-05-30 DIAGNOSIS — E1165 Type 2 diabetes mellitus with hyperglycemia: Secondary | ICD-10-CM | POA: Diagnosis not present

## 2022-05-30 DIAGNOSIS — J069 Acute upper respiratory infection, unspecified: Secondary | ICD-10-CM | POA: Diagnosis not present

## 2022-05-30 DIAGNOSIS — H6982 Other specified disorders of Eustachian tube, left ear: Secondary | ICD-10-CM | POA: Diagnosis not present

## 2022-05-30 MED ORDER — HYDROCODONE BIT-HOMATROP MBR 5-1.5 MG/5ML PO SOLN: 5.0000 mL | Freq: Four times a day (QID) | ORAL | 0 refills | Status: AC | PRN

## 2022-05-30 MED ORDER — DOXYCYCLINE HYCLATE 100 MG PO TABS: 100.0000 mg | ORAL_TABLET | Freq: Two times a day (BID) | ORAL | 0 refills | Status: DC

## 2022-05-30 NOTE — Patient Instructions (Signed)
Please take all new medication as prescribed - the antibiotic, cough medicine  You can also take Mucinex (or it's generic off brand) for congestion, and tylenol as needed for pain.  Please continue all other medications as before, and refills have been done if requested.  Please have the pharmacy call with any other refills you may need.  Please keep your appointments with your specialists as you may have planned

## 2022-05-30 NOTE — Progress Notes (Unsigned)
Patient ID: Eileen Jefferson, female   DOB: July 02, 1951, 71 y.o.   MRN: [id number]        Chief Complaint: follow up HTN, HLD and hyperglycemia ***       HPI:  Eileen Jefferson is a 71 y.o. 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   Iron deficiency anemia    Pulmonary sarcoidosis (HCC)    Sleep apnea    uses CPAP   Thyroid disease    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 Her smoking use included cigarettes. She has a 0.20 pack-year smoking history. She has never used smokeless tobacco. She reports that she does not drink alcohol and does not use drugs. family history includes Clotting disorder in her mother; Colon polyps in her mother; Diabetes in her brother; Heart disease in her brother; Stroke in her maternal grandmother. Allergies  Allergen Reactions   Contrast Media  [Iodinated Contrast Media]    Iohexol Other (See Comments)    " made me feel like I was burning inside"   Current Outpatient Medications on File Prior to Visit  Medication Sig Dispense Refill   Accu-Chek Softclix Lancets lancets Test blood sugar once daily. Dx: E11.9 100 each 2   Alcohol Swabs PADS Test blood sugar once daily. Dx: E11.9 100 each 2   allopurinol (ZYLOPRIM) 100 MG tablet Take 1 tablet (100 mg total) by mouth daily. 90 tablet 3   aspirin EC 81 MG tablet Take 81 mg by mouth daily.     Blood Glucose Calibration (ACCU-CHEK AVIVA) SOLN Test blood sugar once daily. Dx: E11.9 1 each 2   Blood Glucose Monitoring Suppl (ACCU-CHEK AVIVA PLUS) w/Device KIT Test blood sugar once daily. Dx: E11.9 1 kit 0   colchicine 0.6 MG tablet Take 1 tablet (0.6 mg total) by mouth daily as needed (gout or psuedogout pain). 30 tablet 2   famotidine (PEPCID) 40 MG tablet TAKE 1 TABLET (40 MG TOTAL) BY MOUTH 2  TIMES DAILY AS NEEDED FOR HEARTBURN OR INDIGESTION. 180 tablet 2   FARXIGA 10 MG TABS tablet Take  10 mg by mouth daily.     glucose blood (ACCU-CHEK AVIVA PLUS) test strip Test blood sugar once daily. Dx: E11.9 100 each 2   omeprazole (PRILOSEC) 40 MG capsule TAKE 1 CAPSULE (40 MG TOTAL) DAILY 30-60 MINUTES BEFORE BREAKFAST 90 capsule 1   rosuvastatin (CRESTOR) 40 MG tablet TAKE 1 TABLET EVERY DAY 90 tablet 3   valsartan (DIOVAN) 80 MG tablet Take 1 tablet (80 mg total) by mouth daily. 90 tablet 3   No current facility-administered medications on file prior to visit.        ROS:  All others reviewed and negative.  Objective        PE:  BP 138/76 (BP Location: Right Arm, Patient Position: Sitting, Cuff Size: Large)   Pulse (!) 103   Temp (!) 100.9 F (38.3 C) (Oral)   Ht 5' 2"  (1.575 m)   Wt 214 lb 3.2 oz (97.2 kg)   SpO2 97%   BMI 39.18 kg/m                 Constitutional: Pt appears in NAD               HENT: Head: NCAT.                Right Ear: External ear normal.                  Left Ear: External ear normal.                Eyes: . Pupils are equal, round, and reactive to light. Conjunctivae and EOM are normal               Nose: without d/c or deformity               Neck: Neck supple. Gross normal ROM               Cardiovascular: Normal rate and regular rhythm.                 Pulmonary/Chest: Effort normal and breath sounds without rales or wheezing.                Abd:  Soft, NT, ND, + BS, no organomegaly               Neurological: Pt is alert. At baseline orientation, motor grossly intact               Skin: Skin is warm. No rashes, no other new lesions, LE edema - ***               Psychiatric: Pt behavior is normal without agitation   Micro: none  Cardiac tracings I have personally interpreted today:  none  Pertinent Radiological findings (summarize): none   Lab Results  Component Value Date   WBC 7.9 03/14/2022   HGB 10.2 (L) 03/14/2022   HCT 33.9 (L) 03/14/2022   PLT 277.0 03/14/2022   GLUCOSE 77 03/14/2022   CHOL 129 03/14/2022   TRIG 75.0 03/14/2022   HDL 48.00 03/14/2022   LDLCALC 66 03/14/2022   ALT 13 03/14/2022   AST 17 03/14/2022   NA 140 03/14/2022   K 4.0 03/14/2022   CL 105 03/14/2022   CREATININE 1.10 03/14/2022   BUN 14 03/14/2022   CO2 30 03/14/2022   TSH 1.220 06/04/2019   HGBA1C 6.7 (H) 03/14/2022   MICROALBUR 0.6 01/11/2015   Assessment/Plan:  [name] is a  71 y.o. 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  No problem-specific Assessment & Plan notes found for this  encounter.  Followup: No follow-ups on file.  Cathlean Cower, MD 05/30/2022 3:28 PM [facility]

## 2022-06-01 ENCOUNTER — Encounter: Payer: Self-pay | Admitting: Internal Medicine

## 2022-06-01 DIAGNOSIS — H6992 Unspecified Eustachian tube disorder, left ear: Secondary | ICD-10-CM | POA: Insufficient documentation

## 2022-06-01 DIAGNOSIS — H6982 Other specified disorders of Eustachian tube, left ear: Secondary | ICD-10-CM | POA: Insufficient documentation

## 2022-06-01 NOTE — Assessment & Plan Note (Signed)
BP Readings from Last 3 Encounters:  05/30/22 138/76  05/16/22 118/80  03/14/22 134/86   Stable, pt to continue medical treatment diova 80 mg qd

## 2022-06-01 NOTE — Assessment & Plan Note (Signed)
Lab Results  Component Value Date   HGBA1C 6.7 (H) 03/14/2022   Stable, pt to continue current medical treatment farxiga 10 mg qd

## 2022-06-01 NOTE — Assessment & Plan Note (Signed)
Mild to mod, for mucinex bid prn,  to f/u any worsening symptoms or concerns 

## 2022-06-01 NOTE — Assessment & Plan Note (Signed)
Mild to mod, for antibx course doxycycline 100 bid,  to f/u any worsening symptoms or concerns 

## 2022-08-21 ENCOUNTER — Ambulatory Visit (INDEPENDENT_AMBULATORY_CARE_PROVIDER_SITE_OTHER): Payer: Medicare HMO | Admitting: Ophthalmology

## 2022-08-21 DIAGNOSIS — H35072 Retinal telangiectasis, left eye: Secondary | ICD-10-CM

## 2022-08-21 DIAGNOSIS — H35073 Retinal telangiectasis, bilateral: Secondary | ICD-10-CM

## 2022-08-21 DIAGNOSIS — I10 Essential (primary) hypertension: Secondary | ICD-10-CM | POA: Diagnosis not present

## 2022-08-21 DIAGNOSIS — G4733 Obstructive sleep apnea (adult) (pediatric): Secondary | ICD-10-CM

## 2022-08-21 DIAGNOSIS — H35071 Retinal telangiectasis, right eye: Secondary | ICD-10-CM | POA: Diagnosis not present

## 2022-08-21 DIAGNOSIS — D8683 Sarcoid iridocyclitis: Secondary | ICD-10-CM

## 2022-08-21 NOTE — Assessment & Plan Note (Signed)
Compliant on CPAP 

## 2022-08-21 NOTE — Assessment & Plan Note (Signed)
Vastly improved OS since 2020 and institution of CPAP therapy for MAC-TEL.  See photos, OCT of October 2020 compared today.  OS good acuity and no residual active CME noted by OCT

## 2022-08-21 NOTE — Assessment & Plan Note (Signed)
Quiescent disease 

## 2022-08-21 NOTE — Progress Notes (Signed)
08/21/2022     CHIEF COMPLAINT Patient presents for  Chief Complaint  Patient presents with   Retina Follow Up      HISTORY OF PRESENT ILLNESS: Eileen Jefferson is a 71 y.o. female who presents to the clinic today for:   HPI     Retina Follow Up           Diagnosis: Other   Laterality: left eye   Severity: moderate   Course: stable         Comments   9 MOS for DILATE OU, OCT, COLOR FP. Pt stated vision has remained stable since last visit. Pt confirms floaters in both eyes.       Last edited by Silvestre Moment on 08/21/2022  8:15 AM.      Referring physician: Horald Pollen, MD [place],  [place] [id number]  HISTORICAL INFORMATION:   Selected notes from the MEDICAL RECORD NUMBER    Lab Results  Component Value Date   HGBA1C 6.7 (H) 03/14/2022     CURRENT MEDICATIONS: No current outpatient medications on file. (Ophthalmic Drugs)   No current facility-administered medications for this visit. (Ophthalmic Drugs)   Current Outpatient Medications (Other)  Medication Sig   Accu-Chek Softclix Lancets lancets Test blood sugar once daily. Dx: E11.9   Alcohol Swabs PADS Test blood sugar once daily. Dx: E11.9   allopurinol (ZYLOPRIM) 100 MG tablet Take 1 tablet (100 mg total) by mouth daily.   aspirin EC 81 MG tablet Take 81 mg by mouth daily.   Blood Glucose Calibration (ACCU-CHEK AVIVA) SOLN Test blood sugar once daily. Dx: E11.9   Blood Glucose Monitoring Suppl (ACCU-CHEK AVIVA PLUS) w/Device KIT Test blood sugar once daily. Dx: E11.9   colchicine 0.6 MG tablet Take 1 tablet (0.6 mg total) by mouth daily as needed (gout or psuedogout pain).   doxycycline (VIBRA-TABS) 100 MG tablet Take 1 tablet (100 mg total) by mouth 2 (two) times daily.   famotidine (PEPCID) 40 MG tablet TAKE 1 TABLET (40 MG TOTAL) BY MOUTH 2  TIMES DAILY AS NEEDED FOR HEARTBURN OR INDIGESTION.   FARXIGA 10 MG TABS tablet Take 10 mg by mouth daily.   glucose blood  (ACCU-CHEK AVIVA PLUS) test strip Test blood sugar once daily. Dx: E11.9   omeprazole (PRILOSEC) 40 MG capsule TAKE 1 CAPSULE (40 MG TOTAL) DAILY 30-60 MINUTES BEFORE BREAKFAST   rosuvastatin (CRESTOR) 40 MG tablet TAKE 1 TABLET EVERY DAY   valsartan (DIOVAN) 80 MG tablet Take 1 tablet (80 mg total) by mouth daily.   No current facility-administered medications for this visit. (Other)      REVIEW OF SYSTEMS: ROS   Negative for: Constitutional, Gastrointestinal, Neurological, Skin, Genitourinary, Musculoskeletal, HENT, Endocrine, Cardiovascular, Eyes, Respiratory, Psychiatric, Allergic/Imm, Heme/Lymph Last edited by Silvestre Moment on 08/21/2022  8:15 AM.       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   Iron deficiency  anemia    Pulmonary sarcoidosis (HCC)    Sleep apnea    uses CPAP   Thyroid disease    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)    Past Surgical History:  Procedure Laterality Date   ABDOMINAL HYSTERECTOMY N/A    [facility] 11/14/2020   APPENDECTOMY     c-section     x 2   CATARACT EXTRACTION     bilateral   CESAREAN SECTION N/A    [facility] 11/14/2020   COLONOSCOPY     EYE SURGERY N/A    [facility] 11/14/2020   SCLERAL BUCKLE     right   TONSILLECTOMY     TOTAL ABDOMINAL HYSTERECTOMY     TUBAL LIGATION     VITRECTOMY     bilateral    FAMILY HISTORY Family History  Problem Relation Age of Onset   Colon polyps  Mother        brothers x2   Clotting disorder Mother        brother x 2, MGM   Heart disease Brother        mother, MGM   Diabetes Brother        x 2, MGM   Stroke Maternal Grandmother    Colon cancer Neg Hx    Breast cancer Neg Hx    Esophageal cancer Neg Hx    Rectal cancer Neg Hx    Stomach cancer Neg Hx     SOCIAL HISTORY Social History   Tobacco Use   Smoking status: Former    Packs/day: 0.20    Years: 1.00    Total pack years: 0.20    Types: Cigarettes    Quit date: 12/11/1968    Years since quitting: 53.7   Smokeless tobacco: Never  Vaping Use   Vaping Use: Never used  Substance Use Topics   Alcohol use: No   Drug use: No         OPHTHALMIC EXAM:  Base Eye Exam     Visual Acuity (ETDRS)       Right Left   Dist cc 20/150 -1 20/50 -2   Dist ph cc 20/100 -1 NI    Correction: Glasses         Tonometry (Tonopen, 8:19 AM)       Right Left   Pressure 16 13         Pupils       Pupils APD   Right PERRL None   Left PERRL None         Visual Fields       Left Right   Restrictions Partial outer superior temporal, inferior temporal, superior nasal, inferior nasal deficiencies Partial outer superior temporal, inferior temporal, superior nasal, inferior nasal deficiencies         Extraocular Movement       Right Left    Full, Ortho Full, Ortho         Neuro/Psych     Oriented x3: Yes   Mood/Affect: Normal         Dilation     Both eyes: 1.0% Mydriacyl, 2.5% Phenylephrine @ 8:18 AM           Slit Lamp and Fundus Exam     External Exam       Right Left   External Normal Normal         Slit Lamp Exam       Right Left   Lids/Lashes Normal Normal  Conjunctiva/Sclera White and quiet White and quiet   Cornea Clear Clear   Anterior Chamber Deep and quiet Deep and quiet   Iris Round and reactive Round and reactive   Lens Anterior chamber intraocular lens Posterior chamber intraocular lens   Anterior Vitreous  Normal Normal         Fundus Exam       Right Left   Posterior Vitreous Vitrectomized Vitrectomized   Disc 2+ Pallor Pallor 1+   C/D Ratio 0.35 0.6-0.7   Macula Mild epiretinal membrane No obvious CME.   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   Periphery Good PRP, hyperpigmented Good PRP, hyperpigmented            IMAGING AND PROCEDURES  Imaging and Procedures for 08/21/22  OCT, Retina - OU - Both Eyes       Right Eye Quality was good. Central Foveal Thickness: 346. Progression has improved. Findings include cystoid macular edema.   Left Eye Quality was good. Scan locations included subfoveal. Central Foveal Thickness: 285. Progression has improved. Findings include abnormal foveal contour.   Notes OD with chronic cystoid macular edema temporally, yet less than last office visit dated 11-17-2011  most of it looks atrophic but much less center involved cystoid change as compared to October 2020 prior to restart of of CPAP use  OS, complete resolution of CME temporal to the fovea as well as ,compared to CME and sr fluid, from Nov 2020.    subfoveal serous retinal detachment completely resolved centrally post restart of CPAP use November 2020     Color Fundus Photography Optos - OU - Both Eyes       Right Eye Progression has been stable. Disc findings include increased cup to disc ratio, pallor. Macula : microaneurysms.   Left Eye Disc findings include increased cup to disc ratio, pallor. Macula : microaneurysms.   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  OS similar retinal vasculitis and peripheral retinal nonperfusion treated by peripheral PRP and subsequent resolution of systemic disease of sarcoidosis.  Good PRP peripherally OS, clear media OU             ASSESSMENT/PLAN:  Retinal  telangiectasia of left eye Vastly improved OS since 2020 and institution of CPAP therapy for MAC-TEL.  See photos, OCT of October 2020 compared today.  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  No progression of CME.  Coincident with reinstitution and ongoing therapy with CPAP for OSA.  OCT today vastly improved as compared October 2020     ICD-10-CM   1. Retinal telangiectasia of left eye  H35.072 OCT, Retina - OU - Both Eyes    Color Fundus Photography Optos - OU - Both Eyes    2. 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 OSA (obstructive sleep apnea)  G47.33     4. Retinal telangiectasia of right eye  H35.071       1.  No recurrence of MAC-TEL OS.  No progression of MAC-TEL OU.  2.  No progression of MAC-TEL OU.  No  sign of recurrence of sarcoid related vasculitis  3.  NO  Detectable diabetic retinopathy  Ophthalmic Meds Ordered this visit:  No orders of the defined types were placed in this encounter.      Return in about 9 months (around 05/22/2023) for DILATE OU, COLOR FP, OCT.  There are no Patient Instructions on file for this visit.   Explained the diagnoses, plan, and follow up with the patient and they expressed understanding.  Patient expressed understanding of the importance of proper follow up care.   Clent Demark Kvon Mcilhenny M.D. Diseases & Surgery of the Retina and Vitreous Retina [facility] Diabetic [place] 08/21/22     Abbreviations: M myopia (nearsighted); A astigmatism; H hyperopia (farsighted); P presbyopia; Mrx spectacle prescription;  CTL contact lenses; OD right eye; OS left eye; OU both eyes  XT exotropia; ET esotropia; PEK  punctate epithelial keratitis; PEE punctate epithelial erosions; DES dry eye syndrome; MGD meibomian gland dysfunction; ATs artificial tears; PFAT's preservative free artificial tears; [place] nuclear sclerotic cataract; PSC posterior subcapsular cataract; ERM epi-retinal membrane; PVD posterior vitreous detachment; RD retinal detachment; DM diabetes mellitus; DR diabetic retinopathy; NPDR non-proliferative diabetic retinopathy; PDR proliferative diabetic retinopathy; CSME clinically significant macular edema; DME diabetic macular edema; dbh dot blot hemorrhages; CWS cotton wool spot; POAG primary open angle glaucoma; C/D cup-to-disc ratio; HVF humphrey visual field; GVF goldmann visual field; OCT optical coherence tomography; IOP intraocular pressure; BRVO Branch retinal vein occlusion; CRVO central retinal vein occlusion; CRAO central retinal artery occlusion; BRAO branch retinal artery occlusion; RT retinal tear; SB scleral buckle; PPV pars plana vitrectomy; VH Vitreous hemorrhage; PRP panretinal laser photocoagulation; IVK intravitreal kenalog; VMT vitreomacular traction; MH Macular hole;  NVD neovascularization of the disc; NVE neovascularization elsewhere; AREDS age related eye disease study; ARMD age related macular degeneration; POAG primary open angle glaucoma; EBMD epithelial/anterior basement membrane dystrophy; ACIOL anterior chamber intraocular lens; IOL intraocular lens; PCIOL posterior chamber intraocular lens; Phaco/IOL phacoemulsification with intraocular lens placement; [place] photorefractive keratectomy; LASIK laser assisted in situ keratomileusis; HTN hypertension; DM diabetes mellitus; COPD chronic obstructive pulmonary disease

## 2022-08-21 NOTE — Assessment & Plan Note (Signed)
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  No progression of CME.  Coincident with reinstitution and ongoing therapy with CPAP for OSA.  OCT today vastly improved as compared October 2020

## 2022-08-30 DIAGNOSIS — H524 Presbyopia: Secondary | ICD-10-CM | POA: Diagnosis not present

## 2022-09-26 DIAGNOSIS — G4733 Obstructive sleep apnea (adult) (pediatric): Secondary | ICD-10-CM | POA: Diagnosis not present

## 2022-09-26 DIAGNOSIS — H401132 Primary open-angle glaucoma, bilateral, moderate stage: Secondary | ICD-10-CM | POA: Diagnosis not present

## 2022-09-26 DIAGNOSIS — D869 Sarcoidosis, unspecified: Secondary | ICD-10-CM | POA: Diagnosis not present

## 2022-09-26 DIAGNOSIS — H35071 Retinal telangiectasis, right eye: Secondary | ICD-10-CM | POA: Diagnosis not present

## 2022-09-26 DIAGNOSIS — H35033 Hypertensive retinopathy, bilateral: Secondary | ICD-10-CM | POA: Diagnosis not present

## 2022-09-26 DIAGNOSIS — H35342 Macular cyst, hole, or pseudohole, left eye: Secondary | ICD-10-CM | POA: Diagnosis not present

## 2022-09-26 DIAGNOSIS — H47233 Glaucomatous optic atrophy, bilateral: Secondary | ICD-10-CM | POA: Diagnosis not present

## 2022-09-26 DIAGNOSIS — H35072 Retinal telangiectasis, left eye: Secondary | ICD-10-CM | POA: Diagnosis not present

## 2022-09-26 DIAGNOSIS — E113313 Type 2 diabetes mellitus with moderate nonproliferative diabetic retinopathy with macular edema, bilateral: Secondary | ICD-10-CM | POA: Diagnosis not present

## 2022-09-27 ENCOUNTER — Other Ambulatory Visit: Payer: Self-pay | Admitting: Emergency Medicine

## 2022-09-27 DIAGNOSIS — Z1231 Encounter for screening mammogram for malignant neoplasm of breast: Secondary | ICD-10-CM

## 2022-10-04 ENCOUNTER — Ambulatory Visit: Payer: Medicare HMO | Admitting: Emergency Medicine

## 2022-10-19 ENCOUNTER — Other Ambulatory Visit: Payer: Self-pay | Admitting: Emergency Medicine

## 2022-10-19 DIAGNOSIS — E1159 Type 2 diabetes mellitus with other circulatory complications: Secondary | ICD-10-CM

## 2022-10-19 DIAGNOSIS — E1169 Type 2 diabetes mellitus with other specified complication: Secondary | ICD-10-CM

## 2022-10-19 DIAGNOSIS — K219 Gastro-esophageal reflux disease without esophagitis: Secondary | ICD-10-CM

## 2022-10-19 DIAGNOSIS — E1165 Type 2 diabetes mellitus with hyperglycemia: Secondary | ICD-10-CM

## 2022-11-07 DIAGNOSIS — H35072 Retinal telangiectasis, left eye: Secondary | ICD-10-CM | POA: Diagnosis not present

## 2022-11-07 DIAGNOSIS — E113313 Type 2 diabetes mellitus with moderate nonproliferative diabetic retinopathy with macular edema, bilateral: Secondary | ICD-10-CM | POA: Diagnosis not present

## 2022-11-07 DIAGNOSIS — H35342 Macular cyst, hole, or pseudohole, left eye: Secondary | ICD-10-CM | POA: Diagnosis not present

## 2022-11-07 DIAGNOSIS — H35033 Hypertensive retinopathy, bilateral: Secondary | ICD-10-CM | POA: Diagnosis not present

## 2022-11-07 DIAGNOSIS — D869 Sarcoidosis, unspecified: Secondary | ICD-10-CM | POA: Diagnosis not present

## 2022-11-07 DIAGNOSIS — H401132 Primary open-angle glaucoma, bilateral, moderate stage: Secondary | ICD-10-CM | POA: Diagnosis not present

## 2022-11-07 DIAGNOSIS — G4733 Obstructive sleep apnea (adult) (pediatric): Secondary | ICD-10-CM | POA: Diagnosis not present

## 2022-11-07 DIAGNOSIS — H35071 Retinal telangiectasis, right eye: Secondary | ICD-10-CM | POA: Diagnosis not present

## 2022-11-07 DIAGNOSIS — H47233 Glaucomatous optic atrophy, bilateral: Secondary | ICD-10-CM | POA: Diagnosis not present

## 2022-11-14 ENCOUNTER — Ambulatory Visit
Admission: RE | Admit: 2022-11-14 | Discharge: 2022-11-14 | Disposition: A | Discharge status: Home / Self Care | Payer: Medicare HMO | Source: Ambulatory Visit | Attending: Emergency Medicine | Admitting: Emergency Medicine

## 2022-11-14 DIAGNOSIS — Z1231 Encounter for screening mammogram for malignant neoplasm of breast: Secondary | ICD-10-CM

## 2022-12-13 ENCOUNTER — Telehealth: Payer: Self-pay | Admitting: Emergency Medicine

## 2022-12-13 DIAGNOSIS — H35072 Retinal telangiectasis, left eye: Secondary | ICD-10-CM | POA: Diagnosis not present

## 2022-12-13 DIAGNOSIS — H47233 Glaucomatous optic atrophy, bilateral: Secondary | ICD-10-CM | POA: Diagnosis not present

## 2022-12-13 DIAGNOSIS — H35342 Macular cyst, hole, or pseudohole, left eye: Secondary | ICD-10-CM | POA: Diagnosis not present

## 2022-12-13 DIAGNOSIS — D869 Sarcoidosis, unspecified: Secondary | ICD-10-CM | POA: Diagnosis not present

## 2022-12-13 DIAGNOSIS — H401132 Primary open-angle glaucoma, bilateral, moderate stage: Secondary | ICD-10-CM | POA: Diagnosis not present

## 2022-12-13 DIAGNOSIS — H35071 Retinal telangiectasis, right eye: Secondary | ICD-10-CM | POA: Diagnosis not present

## 2022-12-13 DIAGNOSIS — G4733 Obstructive sleep apnea (adult) (pediatric): Secondary | ICD-10-CM | POA: Diagnosis not present

## 2022-12-13 DIAGNOSIS — K219 Gastro-esophageal reflux disease without esophagitis: Secondary | ICD-10-CM

## 2022-12-13 DIAGNOSIS — H35033 Hypertensive retinopathy, bilateral: Secondary | ICD-10-CM | POA: Diagnosis not present

## 2022-12-13 DIAGNOSIS — E113313 Type 2 diabetes mellitus with moderate nonproliferative diabetic retinopathy with macular edema, bilateral: Secondary | ICD-10-CM | POA: Diagnosis not present

## 2022-12-13 MED ORDER — FAMOTIDINE 40 MG PO TABS: ORAL_TABLET | ORAL | 2 refills | Status: DC

## 2022-12-13 NOTE — Telephone Encounter (Signed)
Medication sent to pharmacy on file.  

## 2022-12-13 NOTE — Telephone Encounter (Signed)
MEDICATION: famotidine (PEPCID) 40 MG tablet  PHARMACY: [facility] mail [facility]  Comments:   **Let patient know to contact pharmacy at the end of the day to make sure medication is ready. **  ** Please notify patient to allow 48-72 hours to process**  **Encourage patient to contact the pharmacy for refills or they can request refills through [facility]**

## 2023-01-01 ENCOUNTER — Other Ambulatory Visit: Payer: Self-pay | Admitting: Emergency Medicine

## 2023-01-01 DIAGNOSIS — I152 Hypertension secondary to endocrine disorders: Secondary | ICD-10-CM

## 2023-01-05 ENCOUNTER — Other Ambulatory Visit: Payer: Self-pay | Admitting: Emergency Medicine

## 2023-02-18 ENCOUNTER — Other Ambulatory Visit: Payer: Self-pay | Admitting: Emergency Medicine

## 2023-02-18 DIAGNOSIS — E1169 Type 2 diabetes mellitus with other specified complication: Secondary | ICD-10-CM

## 2023-03-15 ENCOUNTER — Other Ambulatory Visit: Payer: Self-pay | Admitting: Emergency Medicine

## 2023-03-15 DIAGNOSIS — E790 Hyperuricemia without signs of inflammatory arthritis and tophaceous disease: Secondary | ICD-10-CM

## 2023-03-23 ENCOUNTER — Telehealth: Payer: Self-pay | Admitting: Emergency Medicine

## 2023-03-23 NOTE — Telephone Encounter (Signed)
Prescription Request  03/23/2023  LOV: Visit date not found  What is the name of the medication or equipment? Farxiga 10 mg  Have you contacted your pharmacy to request a refill? Yes   Which pharmacy would you like this sent to?  [facility] - [place], [place] - [address] [place] [address] Phone: [phone] Fax: [phone]   Patient notified that their request is being sent to the clinical staff for review and that they should receive a response within 2 business days.   Please advise at Mobile [phone] (mobile)

## 2023-03-23 NOTE — Telephone Encounter (Signed)
RX denied pt is overdue for annual appt. Last saw MD 03/14/22../l,mb

## 2023-03-27 ENCOUNTER — Ambulatory Visit (INDEPENDENT_AMBULATORY_CARE_PROVIDER_SITE_OTHER): Payer: Medicare HMO

## 2023-03-27 VITALS — Ht 63.0 in | Wt 214.0 lb

## 2023-03-27 DIAGNOSIS — Z0000 Encounter for general adult medical examination without abnormal findings: Secondary | ICD-10-CM | POA: Diagnosis not present

## 2023-03-27 NOTE — Progress Notes (Signed)
Subjective:   Eileen Jefferson is a 72 y.o. female who presents for Medicare Annual (Subsequent) preventive examination.     I connected with  Ara Kussmaul on 03/27/23 by a audio enabled telemedicine application and verified that I am speaking with the correct person using two identifiers.  Patient Location: Home  Provider Location: Home Office  I discussed the limitations of evaluation and management by telemedicine. The patient expressed understanding and agreed to proceed.  Review of Systems       Cardiac Risk Factors include: advanced age (>61men, >54 women);diabetes mellitus;dyslipidemia;hypertension;obesity (BMI >30kg/m2)     Objective:    Today's Vitals   03/27/23 0843  Weight: 214 lb (97.1 kg)  Height:  (1.6 m)   Body mass index is 37.91 kg/m.     03/27/2023    8:50 AM 03/24/2022    8:21 AM 11/15/2020    9:02 AM 08/16/2018    6:38 AM  Advanced Directives  Does Patient Have a Medical Advance Directive? No No No No  Would patient like information on creating a medical advance directive? No - Patient declined No - Patient declined No - Patient declined Yes (ED - Information included in AVS)    Current Medications (verified) Outpatient Encounter Medications as of 03/27/2023  Medication Sig   Accu-Chek Softclix Lancets lancets TEST BLOOD SUGAR ONE TIME DAILY   Alcohol Swabs (DROPSAFE ALCOHOL PREP) 70 % PADS USE TO BLOOD SUGAR EVERY DAY   allopurinol (ZYLOPRIM) 100 MG tablet TAKE 1 TABLET EVERY DAY   aspirin EC 81 MG tablet Take 81 mg by mouth daily.   Blood Glucose Calibration (ACCU-CHEK AVIVA) SOLN Test blood sugar once daily. Dx: E11.9   Blood Glucose Monitoring Suppl (ACCU-CHEK AVIVA PLUS) w/Device KIT Test blood sugar once daily. Dx: E11.9   carboxymethylcellulose (REFRESH PLUS) 0.5 % SOLN 1 drop daily as needed.   colchicine 0.6 MG tablet Take 1 tablet (0.6 mg total) by mouth daily as needed (gout or psuedogout pain).   famotidine  (PEPCID) 40 MG tablet TAKE 1 TABLET (40 MG TOTAL) BY MOUTH 2  TIMES DAILY AS NEEDED FOR HEARTBURN OR INDIGESTION.   FARXIGA 10 MG TABS tablet Take 10 mg by mouth daily.   glucose blood (ACCU-CHEK AVIVA PLUS) test strip TEST BLOOD SUGAR ONE TIME DAILY   Latanoprost PF (IYUZEH) 0.005 % SOLN Apply to eye at bedtime. Drop in each eye at bedtime   omeprazole (PRILOSEC) 40 MG capsule TAKE 1 CAPSULE (40 MG TOTAL) DAILY 30 TO 60 MINUTES BEFORE BREAKFAST   rosuvastatin (CRESTOR) 40 MG tablet TAKE 1 TABLET EVERY DAY   valsartan (DIOVAN) 80 MG tablet TAKE 1 TABLET EVERY DAY   doxycycline (VIBRA-TABS) 100 MG tablet Take 1 tablet (100 mg total) by mouth 2 (two) times daily.   No facility-administered encounter medications on file as of 03/27/2023.    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    Past Surgical History:  Procedure Laterality Date   ABDOMINAL HYSTERECTOMY N/A    [facility] 11/14/2020   APPENDECTOMY     c-section     x 2   CATARACT EXTRACTION     bilateral   CESAREAN SECTION N/A    [facility] 11/14/2020   COLONOSCOPY     EYE SURGERY N/A    [facility] 11/14/2020   SCLERAL BUCKLE     right   TONSILLECTOMY     TOTAL ABDOMINAL HYSTERECTOMY     TUBAL LIGATION     VITRECTOMY     bilateral   Family History  Problem Relation Age of Onset   Colon polyps Mother        brothers x2   Clotting disorder  Mother        brother x 2, MGM   Heart disease Brother        mother, MGM   Diabetes Brother        x 2, MGM   Stroke Maternal Grandmother    Colon cancer Neg Hx    Breast cancer Neg Hx    Esophageal cancer Neg Hx    Rectal cancer Neg Hx    Stomach cancer Neg Hx    Social History   Socioeconomic History   Marital status: Married    Spouse name: Not on file   Number of children: 2   Years of education: Not on file   Highest education level: Not on file  Occupational History   Occupation: behavioral health  Tobacco Use   Smoking status: Former    Packs/day: 0.20    Years: 1.00    Additional pack years: 0.00    Total pack years: 0.20    Types: Cigarettes    Quit date: 12/11/1968    Years since quitting: 54.3   Smokeless tobacco: Never  Vaping Use   Vaping Use: Never used  Substance and Sexual Activity   Alcohol use: No   Drug use: No   Sexual activity: Not on file  Other Topics Concern   Not on file  Social History Narrative   Not on file   Social Determinants of Health   Financial Resource Strain: Low Risk  (03/27/2023)   Overall Financial Resource Strain (CARDIA)    Difficulty of Paying Living Expenses: Not very hard  Food Insecurity: No Food Insecurity (03/27/2023)   Hunger Vital Sign    Worried About Running Out of Food in the Last Year: Never true    Ran Out of Food in the Last Year: Never true  Transportation Needs: No Transportation Needs (03/27/2023)   PRAPARE - Administrator, Civil Service (Medical): No    Lack of Transportation (Non-Medical): No  Physical Activity: Inactive (03/27/2023)   Exercise Vital Sign    Days of Exercise per Week: 0 days    Minutes of Exercise per Session: 0 min  Stress: No Stress Concern Present (03/27/2023)   [employer] of Occupational Health - Occupational Stress Questionnaire    Feeling of Stress : Only a little  Social Connections: Unknown (03/27/2023)   Social Connection and Isolation Panel [NHANES]     Frequency of Communication with Friends and Family: More than three times a week    Frequency of Social Gatherings with Friends and Family: Once a week    Attends Religious Services: Not on Insurance claims handler of Clubs or Organizations: Yes  Attends Engineer, structural: More than 4 times per year    Marital Status: Married    Tobacco Counseling Counseling given: Not Answered   Clinical Intake:  Pre-visit preparation completed: Yes  Pain : No/denies pain     BMI - recorded: 37.91 Nutritional Status: BMI > 30  Obese Nutritional Risks: None Diabetes: No  How often do you need to have someone help you when you read instructions, pamphlets, or other written materials from your doctor or pharmacy?: 1 - Never  Diabetic?Yes  Interpreter Needed?: No  Information entered by :: Kandis Cocking, CMA   Activities of Daily Living    03/27/2023    8:51 AM 03/24/2023    8:28 AM  In your present state of health, do you have any difficulty performing the following activities:  Hearing? 0 0  Vision? 1 1  Difficulty concentrating or making decisions? 0 0  Walking or climbing stairs? 0 0  Dressing or bathing? 0 0  Doing errands, shopping? 0 0  Preparing Food and eating ? N N  Using the Toilet? N N  In the past six months, have you accidently leaked urine? N N  Do you have problems with loss of bowel control? N N  Managing your Medications? N N  Managing your Finances? N N  Housekeeping or managing your Housekeeping? N N    Patient Care Team: Georgina Quint, MD as PCP - General (Internal Medicine) Coralyn Helling, MD as Referring Physician (Pulmonary Disease) Chalmers Guest, MD as Consulting Physician (Ophthalmology) Hilarie Fredrickson, MD as Consulting Physician (Gastroenterology)  Indicate any recent Medical Services you may have received from other than [facility] providers in the past year (date may be approximate).     Assessment:   This is a routine wellness  examination for [place].  Hearing/Vision screen Hearing Screening - Comments:: Denies hearing difficulties   Vision Screening - Comments:: Wears rx glasses - up to date with routine eye exams with  Feb 2024-  Next appt with Dr. Hervey Ard is May 2024  Dietary issues and exercise activities discussed: Current Exercise Habits: The patient does not participate in regular exercise at present   Goals Addressed             This Visit's Progress    Patient Stated   On track    Continue to loose weight Keep Diabetes stable       Depression Screen    03/27/2023    8:55 AM 05/30/2022    3:07 PM 03/24/2022    8:22 AM 03/24/2022    8:20 AM 03/14/2022    2:51 PM 12/14/2021    3:35 PM 05/31/2021    1:27 PM  PHQ 2/9 Scores  PHQ - 2 Score 0 0 0 0 0 0 0  PHQ- 9 Score  2         Fall Risk    03/27/2023    8:46 AM 03/24/2023    8:28 AM 05/30/2022    3:07 PM 03/24/2022    8:22 AM 03/14/2022    2:51 PM  Fall Risk   Falls in the past year? 0 0 0 0 0  Number falls in past yr: 0  0 0   Injury with Fall? 0  0 0   Follow up    Falls evaluation completed     FALL RISK PREVENTION PERTAINING TO THE HOME:  Any stairs in or around the home? Yes  If so, are there any without handrails? No  Home free of loose throw rugs in walkways, pet beds, electrical cords, etc? Yes  Adequate lighting in your home to reduce risk of falls? Yes   ASSISTIVE DEVICES UTILIZED TO PREVENT FALLS:  Life alert? No  Use of a cane, walker or w/c? No  Grab bars in the bathroom? Yes  Shower chair or bench in shower? No  Elevated toilet seat or a handicapped toilet? Yes   TIMED UP AND GO:  Was the test performed? No ,  Televisit   Cognitive Function:        03/27/2023    8:53 AM 11/15/2020    9:03 AM  6CIT Screen  What Year? 0 points 0 points  What month? 0 points 0 points  What time? 0 points 0 points  Count back from 20 0 points 0 points  Months in reverse 0 points 0 points  Repeat phrase 0 points 0 points  Total  Score 0 points 0 points    Immunizations Immunization History  Administered Date(s) Administered   Fluad Quad(high Dose 65+) 09/22/2019   Influenza, High Dose Seasonal PF 11/23/2018   Influenza,inj,Quad PF,6+ Mos 10/02/2013, 01/11/2015, 01/20/2016, 01/11/2017, 02/13/2018   Influenza-Unspecified 09/19/2020, 09/06/2021   PFIZER(Purple Top)SARS-COV-2 Vaccination 01/02/2020, 01/23/2020, 09/19/2020, 02/17/2021   Pfizer Covid-19 Vaccine Bivalent Booster 23yrs & up 09/06/2021   Pneumococcal Conjugate-13 11/23/2018   Pneumococcal Polysaccharide-23 10/02/2013, 04/26/2020   Tdap 01/11/2015    TDAP status: Up to date  Flu Vaccine status: Up to date  Pneumococcal vaccine status: Up to date  Covid-19 vaccine status: Information provided on how to obtain vaccines.   Qualifies for Shingles Vaccine? Yes   Zostavax completed No   Shingrix Completed?: No.    Education has been provided regarding the importance of this vaccine. Patient has been advised to call insurance company to determine out of pocket expense if they have not yet received this vaccine. Advised may also receive vaccine at local pharmacy or Health Dept. Verbalized acceptance and understanding.  Screening Tests Health Maintenance  Topic Date Due   Zoster Vaccines- Shingrix (1 of 2) Never done   Diabetic kidney evaluation - Urine ACR  02/14/2019   FOOT EXAM  04/26/2021   HEMOGLOBIN A1C  09/13/2022   COVID-19 Vaccine (7 - 2023-24 season) 11/13/2022   Diabetic kidney evaluation - eGFR measurement  03/15/2023   OPHTHALMOLOGY EXAM  05/25/2023   INFLUENZA VACCINE  07/12/2023   MAMMOGRAM  11/15/2023   Medicare Annual Wellness (AWV)  03/26/2024   DTaP/Tdap/Td (2 - Td or Tdap) 01/11/2025   COLONOSCOPY (Pts 45-65yrs Insurance coverage will need to be confirmed)  12/23/2031   Pneumonia Vaccine 20+ Years old  Completed   DEXA SCAN  Completed   Hepatitis C Screening  Completed   HPV VACCINES  Aged Out    Health  Maintenance  Health Maintenance Due  Topic Date Due   Zoster Vaccines- Shingrix (1 of 2) Never done   Diabetic kidney evaluation - Urine ACR  02/14/2019   FOOT EXAM  04/26/2021   HEMOGLOBIN A1C  09/13/2022   COVID-19 Vaccine (7 - 2023-24 season) 11/13/2022   Diabetic kidney evaluation - eGFR measurement  03/15/2023    Colorectal cancer screening: Type of screening: Colonoscopy. Completed 12/22/2021. Repeat every 10 years  Mammogram status: Completed 11/14/22. Repeat every year  Bone Density status: Completed 06/05/2018. Results reflect: Bone density results: NORMAL. Repeat every 2 years.  Lung Cancer Screening: (Low Dose CT Chest recommended if Age 55-80 years, 30 pack-year currently smoking OR  have quit w/in 15years.) does not qualify.   Lung Cancer Screening Referral: N/A  Additional Screening:  Hepatitis C Screening: does qualify; Completed 01/20/2016  Vision Screening: Recommended annual ophthalmology exams for early detection of glaucoma and other disorders of the eye. Is the patient up to date with their annual eye exam?  Yes  Who is the provider or what is the name of the office in which the patient attends annual eye exams?  Dr. [facility], [facility]  If pt is not established with a provider, would they like to be referred to a provider to establish care? No .   Dental Screening: Recommended annual dental exams for proper oral hygiene  Community Resource Referral / Chronic Care Management: CRR required this visit?  No   CCM required this visit?  No      Plan:     I have personally reviewed and noted the following in the patient's chart:   Medical and social history Use of alcohol, tobacco or illicit drugs  Current medications and supplements including opioid prescriptions. Patient is not currently taking opioid prescriptions. Functional ability and status Nutritional status Physical activity Advanced  directives List of other physicians Hospitalizations, surgeries, and ER visits in previous 12 months Vitals Screenings to include cognitive, depression, and falls Referrals and appointments  In addition, I have reviewed and discussed with patient certain preventive protocols, quality metrics, and best practice recommendations. A written personalized care plan for preventive services as well as general preventive health recommendations were provided to patient.     Milus Mallick, CMA   03/27/2023   Nurse Notes: Discussed needed vaccines.   Patient will contact office to schedule an appointment for yearly physical with provider

## 2023-03-27 NOTE — Patient Instructions (Signed)
Eileen Jefferson , Thank you for taking time to come for your Medicare Wellness Visit. I appreciate your ongoing commitment to your health goals. Please review the following plan we discussed and let me know if I can assist you in the future.   These are the goals we discussed:  Goals      Patient Stated     Continue to loose weight Keep Diabetes stable        This is a list of the screening recommended for you and due dates:  Health Maintenance  Topic Date Due   Zoster (Shingles) Vaccine (1 of 2) Never done   Yearly kidney health urinalysis for diabetes  02/14/2019   Complete foot exam   04/26/2021   Hemoglobin A1C  09/13/2022   COVID-19 Vaccine (7 - 2023-24 season) 11/13/2022   Yearly kidney function blood test for diabetes  03/15/2023   Eye exam for diabetics  05/25/2023   Flu Shot  07/12/2023   Mammogram  11/15/2023   Medicare Annual Wellness Visit  03/26/2024   DTaP/Tdap/Td vaccine (2 - Td or Tdap) 01/11/2025   Colon Cancer Screening  12/23/2031   Pneumonia Vaccine  Completed   DEXA scan (bone density measurement)  Completed   Hepatitis C Screening: USPSTF Recommendation to screen - Ages 87-79 yo.  Completed   HPV Vaccine  Aged Out    Advanced directives: Discussed patient will pick up information at office   Conditions/risks identified: Keep up the good work  Next appointment: Follow up in one year for your annual wellness visit:   03/31/24 :45 am   Preventive Care 65 Years and Older, Female Preventive care refers to lifestyle choices and visits with your health care provider that can promote health and wellness. What does preventive care include? A yearly physical exam. This is also called an annual well check. Dental exams once or twice a year. Routine eye exams. Ask your health care provider how often you should have your eyes checked. Personal lifestyle choices, including: Daily care of your teeth and gums. Regular physical activity. Eating a healthy  diet. Avoiding tobacco and drug use. Limiting alcohol use. Practicing safe sex. Taking low-dose aspirin every day. Taking vitamin and mineral supplements as recommended by your health care provider. What happens during an annual well check? The services and screenings done by your health care provider during your annual well check will depend on your age, overall health, lifestyle risk factors, and family history of disease. Counseling  Your health care provider may ask you questions about your: Alcohol use. Tobacco use. Drug use. Emotional well-being. Home and relationship well-being. Sexual activity. Eating habits. History of falls. Memory and ability to understand (cognition). Work and work Astronomer. Reproductive health. Screening  You may have the following tests or measurements: Height, weight, and BMI. Blood pressure. Lipid and cholesterol levels. These may be checked every 5 years, or more frequently if you are over 43 years old. Skin check. Lung cancer screening. You may have this screening every year starting at age 36 if you have a 30-pack-year history of smoking and currently smoke or have quit within the past 15 years. Fecal occult blood test (FOBT) of the stool. You may have this test every year starting at age 70. Flexible sigmoidoscopy or colonoscopy. You may have a sigmoidoscopy every 5 years or a colonoscopy every 10 years starting at age 16. Hepatitis C blood test. Hepatitis B blood test. Sexually transmitted disease (STD) testing. Diabetes screening. This is done by  checking your blood sugar (glucose) after you have not eaten for a while (fasting). You may have this done every 1-3 years. Bone density scan. This is done to screen for osteoporosis. You may have this done starting at age 77. Mammogram. This may be done every 1-2 years. Talk to your health care provider about how often you should have regular mammograms. Talk with your health care provider about  your test results, treatment options, and if necessary, the need for more tests. Vaccines  Your health care provider may recommend certain vaccines, such as: Influenza vaccine. This is recommended every year. Tetanus, diphtheria, and acellular pertussis (Tdap, Td) vaccine. You may need a Td booster every 10 years. Zoster vaccine. You may need this after age 10. Pneumococcal 13-valent conjugate (PCV13) vaccine. One dose is recommended after age 30. Pneumococcal polysaccharide (PPSV23) vaccine. One dose is recommended after age 4. Talk to your health care provider about which screenings and vaccines you need and how often you need them. This information is not intended to replace advice given to you by your health care provider. Make sure you discuss any questions you have with your health care provider. Document Released: 12/24/2015 Document Revised: 08/16/2016 Document Reviewed: 09/28/2015 Elsevier Interactive Patient Education  2017 [place] Prevention in the Home Falls can cause injuries. They can happen to people of all ages. There are many things you can do to make your home safe and to help prevent falls. What can I do on the outside of my home? Regularly fix the edges of walkways and driveways and fix any cracks. Remove anything that might make you trip as you walk through a door, such as a raised step or threshold. Trim any bushes or trees on the path to your home. Use bright outdoor lighting. Clear any walking paths of anything that might make someone trip, such as rocks or tools. Regularly check to see if handrails are loose or broken. Make sure that both sides of any steps have handrails. Any raised decks and porches should have guardrails on the edges. Have any leaves, snow, or ice cleared regularly. Use sand or salt on walking paths during winter. Clean up any spills in your garage right away. This includes oil or grease spills. What can I do in the bathroom? Use  night lights. Install grab bars by the toilet and in the tub and shower. Do not use towel bars as grab bars. Use non-skid mats or decals in the tub or shower. If you need to sit down in the shower, use a plastic, non-slip stool. Keep the floor dry. Clean up any water that spills on the floor as soon as it happens. Remove soap buildup in the tub or shower regularly. Attach bath mats securely with double-sided non-slip rug tape. Do not have throw rugs and other things on the floor that can make you trip. What can I do in the bedroom? Use night lights. Make sure that you have a light by your bed that is easy to reach. Do not use any sheets or blankets that are too big for your bed. They should not hang down onto the floor. Have a firm chair that has side arms. You can use this for support while you get dressed. Do not have throw rugs and other things on the floor that can make you trip. What can I do in the kitchen? Clean up any spills right away. Avoid walking on wet floors. Keep items that you use a  lot in easy-to-reach places. If you need to reach something above you, use a strong step stool that has a grab bar. Keep electrical cords out of the way. Do not use floor polish or wax that makes floors slippery. If you must use wax, use non-skid floor wax. Do not have throw rugs and other things on the floor that can make you trip. What can I do with my stairs? Do not leave any items on the stairs. Make sure that there are handrails on both sides of the stairs and use them. Fix handrails that are broken or loose. Make sure that handrails are as long as the stairways. Check any carpeting to make sure that it is firmly attached to the stairs. Fix any carpet that is loose or worn. Avoid having throw rugs at the top or bottom of the stairs. If you do have throw rugs, attach them to the floor with carpet tape. Make sure that you have a light switch at the top of the stairs and the bottom of the  stairs. If you do not have them, ask someone to add them for you. What else can I do to help prevent falls? Wear shoes that: Do not have high heels. Have rubber bottoms. Are comfortable and fit you well. Are closed at the toe. Do not wear sandals. If you use a stepladder: Make sure that it is fully opened. Do not climb a closed stepladder. Make sure that both sides of the stepladder are locked into place. Ask someone to hold it for you, if possible. Clearly mark and make sure that you can see: Any grab bars or handrails. First and last steps. Where the edge of each step is. Use tools that help you move around (mobility aids) if they are needed. These include: Canes. Walkers. Scooters. Crutches. Turn on the lights when you go into a dark area. Replace any light bulbs as soon as they burn out. Set up your furniture so you have a clear path. Avoid moving your furniture around. If any of your floors are uneven, fix them. If there are any pets around you, be aware of where they are. Review your medicines with your doctor. Some medicines can make you feel dizzy. This can increase your chance of falling. Ask your doctor what other things that you can do to help prevent falls. This information is not intended to replace advice given to you by your health care provider. Make sure you discuss any questions you have with your health care provider. Document Released: 09/23/2009 Document Revised: 05/04/2016 Document Reviewed: 01/01/2015 Elsevier Interactive Patient Education  2017 [employer].

## 2023-05-03 ENCOUNTER — Encounter: Payer: Self-pay | Admitting: Internal Medicine

## 2023-05-17 ENCOUNTER — Ambulatory Visit: Payer: Medicare HMO | Admitting: Internal Medicine

## 2023-05-22 ENCOUNTER — Encounter (INDEPENDENT_AMBULATORY_CARE_PROVIDER_SITE_OTHER): Payer: Self-pay

## 2023-05-22 ENCOUNTER — Encounter (INDEPENDENT_AMBULATORY_CARE_PROVIDER_SITE_OTHER): Payer: Medicare HMO | Admitting: Ophthalmology

## 2023-06-06 DIAGNOSIS — H35033 Hypertensive retinopathy, bilateral: Secondary | ICD-10-CM | POA: Diagnosis not present

## 2023-06-06 DIAGNOSIS — G4733 Obstructive sleep apnea (adult) (pediatric): Secondary | ICD-10-CM | POA: Diagnosis not present

## 2023-06-06 DIAGNOSIS — H401132 Primary open-angle glaucoma, bilateral, moderate stage: Secondary | ICD-10-CM | POA: Diagnosis not present

## 2023-06-06 DIAGNOSIS — H35342 Macular cyst, hole, or pseudohole, left eye: Secondary | ICD-10-CM | POA: Diagnosis not present

## 2023-06-06 DIAGNOSIS — E113313 Type 2 diabetes mellitus with moderate nonproliferative diabetic retinopathy with macular edema, bilateral: Secondary | ICD-10-CM | POA: Diagnosis not present

## 2023-06-06 DIAGNOSIS — H47233 Glaucomatous optic atrophy, bilateral: Secondary | ICD-10-CM | POA: Diagnosis not present

## 2023-06-06 DIAGNOSIS — H35071 Retinal telangiectasis, right eye: Secondary | ICD-10-CM | POA: Diagnosis not present

## 2023-06-06 DIAGNOSIS — H35072 Retinal telangiectasis, left eye: Secondary | ICD-10-CM | POA: Diagnosis not present

## 2023-06-07 ENCOUNTER — Encounter: Payer: Self-pay | Admitting: Ophthalmology

## 2023-06-07 DIAGNOSIS — H35073 Retinal telangiectasis, bilateral: Secondary | ICD-10-CM | POA: Diagnosis not present

## 2023-06-07 DIAGNOSIS — E113511 Type 2 diabetes mellitus with proliferative diabetic retinopathy with macular edema, right eye: Secondary | ICD-10-CM | POA: Diagnosis not present

## 2023-06-07 DIAGNOSIS — H3523 Other non-diabetic proliferative retinopathy, bilateral: Secondary | ICD-10-CM | POA: Diagnosis not present

## 2023-06-07 LAB — HM DIABETES EYE EXAM

## 2023-06-12 ENCOUNTER — Ambulatory Visit: Payer: Medicare HMO | Admitting: Internal Medicine

## 2023-06-18 ENCOUNTER — Encounter: Payer: Self-pay | Admitting: Emergency Medicine

## 2023-06-18 DIAGNOSIS — E113511 Type 2 diabetes mellitus with proliferative diabetic retinopathy with macular edema, right eye: Secondary | ICD-10-CM | POA: Diagnosis not present

## 2023-07-04 NOTE — Progress Notes (Signed)
HPI F former smoker followed for OSA, complicated by HBP, GERD, DM2 with retinopathy, Macular degeneration, Cervical DDD, Sarcoid/ Iridocyclitis, Glaucoma, Obesity, PSG 10/11/04>>AHI 16.7, SpO2 low 85% PSG 10/31/12>>AHI 12.9, REM 44.8, SpO2 low 59%, PLMI 0.  HST 01/08/20- AHI 17.1/ hr, desaturation to 70%, body weight 211 lbs  -----------------------------------------------------------------------------------------------   05/16/22- 71 yoF former smoker followed for OSA, complicated by HTN, GERD, DM2 with retinopathy, Macular degeneration, Cervical DDD, Sarcoid/ Iridocyclitis, Glaucoma, Obesity, CPAP auto 5-20/ [facility] Download-compliance 83%, AHI 0.6/ hr Body weight today-215 lbs Covid vax- 5 [employer] -----Follow up. Patient has no complaints.  Download reviewed.  She is doing very well with her CPAP and denies new problems or concerns.  She has a jury duty summons and I told her that treated CPAP would not be an exclusion.  Breathing is comfortable. She has gained some weight unfortunately.  07/05/23- 16 yoF former smoker followed for OSA, complicated by HTN, GERD, DM2 with retinopathy, Macular degeneration, Cervical DDD, Sarcoid/ Iridocyclitis, Glaucoma, Obesity, CPAP auto 5-20/ [facility]  [employer] 10 Download-compliance 97%, AHI 0.5/ hr Body weight today-211 lbs Download reviewed.  She reports doing very well with no specific concerns.  ROS-see HPI   + = positive Constitutional:    weight loss, night sweats, fevers, chills, fatigue, lassitude. HEENT:    headaches, difficulty swallowing, tooth/dental problems, sore throat,       sneezing, itching, ear ache, nasal congestion, post nasal drip, snoring CV:    chest pain, orthopnea, PND, swelling in lower extremities, anasarca,                                   dizziness, palpitations Resp:   shortness of breath with exertion or at rest.                productive cough,   non-productive cough, coughing up of blood.              change in color  of mucus.  wheezing.   Skin:    rash or lesions. GI:    +heartburn, indigestion, abdominal pain, nausea, vomiting, diarrhea,                 change in bowel habits, loss of appetite GU: dysuria, change in color of urine, no urgency or frequency.   flank pain. MS:   joint pain, stiffness, decreased range of motion, back pain. Neuro-     nothing unusual Psych:  change in mood or affect.  depression or anxiety.   memory loss.  OBJ- Physical Exam General- Alert, Oriented, Affect-appropriate, Distress- none acute, + obese Skin- rash-none, lesions- none, excoriation- none Lymphadenopathy- none Head- atraumatic            Eyes- Gross vision intact, PERRLA, conjunctivae and secretions clear            Ears- Hearing, canals-normal            Nose- Clear, no-Septal dev, mucus, polyps, erosion, perforation             Throat- Mallampati IV , mucosa clear , drainage- none, tonsils- atrophic,  + teeth Neck- flexible , trachea midline, no stridor , thyroid nl, carotid no bruit Chest - symmetrical excursion , unlabored           Heart/CV- RRR , no murmur , no gallop  , no rub, nl s1 s2                           -  JVD- none , edema- none, stasis changes- none, varices- none           Lung- clear to P&A, wheeze- none, cough- none , dullness-none, rub- none           Chest wall-  Abd-  Br/ Gen/ Rectal- Not done, not indicated Extrem- cyanosis- none, clubbing, none, atrophy- none, strength- nl Neuro- grossly intact to observation

## 2023-07-05 ENCOUNTER — Ambulatory Visit: Payer: Medicare HMO | Admitting: Internal Medicine

## 2023-07-05 ENCOUNTER — Encounter: Payer: Self-pay | Admitting: Internal Medicine

## 2023-07-05 VITALS — BP 130/86 | HR 86 | Ht 62.5 in | Wt 211.0 lb

## 2023-07-05 DIAGNOSIS — G4733 Obstructive sleep apnea (adult) (pediatric): Secondary | ICD-10-CM | POA: Diagnosis not present

## 2023-07-05 DIAGNOSIS — Z6838 Body mass index (BMI) 38.0-38.9, adult: Secondary | ICD-10-CM

## 2023-07-05 DIAGNOSIS — E66812 Obesity, class 2: Secondary | ICD-10-CM

## 2023-07-05 NOTE — Patient Instructions (Signed)
We can continue CPAP auto 5-20  You are doing great with CPAP ! Please call if we can help

## 2023-07-17 DIAGNOSIS — H35351 Cystoid macular degeneration, right eye: Secondary | ICD-10-CM | POA: Diagnosis not present

## 2023-07-17 DIAGNOSIS — H35073 Retinal telangiectasis, bilateral: Secondary | ICD-10-CM | POA: Diagnosis not present

## 2023-07-17 DIAGNOSIS — E113511 Type 2 diabetes mellitus with proliferative diabetic retinopathy with macular edema, right eye: Secondary | ICD-10-CM | POA: Diagnosis not present

## 2023-07-17 DIAGNOSIS — H3523 Other non-diabetic proliferative retinopathy, bilateral: Secondary | ICD-10-CM | POA: Diagnosis not present

## 2023-07-17 DIAGNOSIS — H401131 Primary open-angle glaucoma, bilateral, mild stage: Secondary | ICD-10-CM | POA: Diagnosis not present

## 2023-07-17 LAB — HM DIABETES EYE EXAM

## 2023-07-27 ENCOUNTER — Encounter: Payer: Self-pay | Admitting: Internal Medicine

## 2023-07-27 NOTE — Assessment & Plan Note (Signed)
Encourage lifestyle changes for diet/exercise hoping that this will improve control of her sleep apnea.

## 2023-07-27 NOTE — Assessment & Plan Note (Signed)
Effects from CPAP with good compliance and control. Plan-continue auto 5-20

## 2023-08-01 ENCOUNTER — Encounter: Payer: Self-pay | Admitting: Emergency Medicine

## 2023-08-01 DIAGNOSIS — H3523 Other non-diabetic proliferative retinopathy, bilateral: Secondary | ICD-10-CM | POA: Diagnosis not present

## 2023-08-01 DIAGNOSIS — H35351 Cystoid macular degeneration, right eye: Secondary | ICD-10-CM | POA: Diagnosis not present

## 2023-08-01 DIAGNOSIS — H35073 Retinal telangiectasis, bilateral: Secondary | ICD-10-CM | POA: Diagnosis not present

## 2023-08-01 DIAGNOSIS — H401131 Primary open-angle glaucoma, bilateral, mild stage: Secondary | ICD-10-CM | POA: Diagnosis not present

## 2023-08-01 DIAGNOSIS — E113511 Type 2 diabetes mellitus with proliferative diabetic retinopathy with macular edema, right eye: Secondary | ICD-10-CM | POA: Diagnosis not present

## 2023-08-07 ENCOUNTER — Encounter: Payer: Self-pay | Admitting: Internal Medicine

## 2023-08-30 DIAGNOSIS — H35351 Cystoid macular degeneration, right eye: Secondary | ICD-10-CM | POA: Diagnosis not present

## 2023-08-30 DIAGNOSIS — H401131 Primary open-angle glaucoma, bilateral, mild stage: Secondary | ICD-10-CM | POA: Diagnosis not present

## 2023-08-30 DIAGNOSIS — D869 Sarcoidosis, unspecified: Secondary | ICD-10-CM | POA: Diagnosis not present

## 2023-08-30 DIAGNOSIS — H3523 Other non-diabetic proliferative retinopathy, bilateral: Secondary | ICD-10-CM | POA: Diagnosis not present

## 2023-08-30 DIAGNOSIS — E113511 Type 2 diabetes mellitus with proliferative diabetic retinopathy with macular edema, right eye: Secondary | ICD-10-CM | POA: Diagnosis not present

## 2023-08-30 DIAGNOSIS — H35073 Retinal telangiectasis, bilateral: Secondary | ICD-10-CM | POA: Diagnosis not present

## 2023-09-24 ENCOUNTER — Ambulatory Visit: Payer: Medicare HMO | Admitting: Emergency Medicine

## 2023-09-24 ENCOUNTER — Encounter: Payer: Self-pay | Admitting: Emergency Medicine

## 2023-09-24 VITALS — BP 130/70 | HR 80 | Temp 98.2°F | Ht 62.5 in | Wt 215.4 lb

## 2023-09-24 DIAGNOSIS — E785 Hyperlipidemia, unspecified: Secondary | ICD-10-CM | POA: Diagnosis not present

## 2023-09-24 DIAGNOSIS — E1159 Type 2 diabetes mellitus with other circulatory complications: Secondary | ICD-10-CM

## 2023-09-24 DIAGNOSIS — N1831 Chronic kidney disease, stage 3a: Secondary | ICD-10-CM

## 2023-09-24 DIAGNOSIS — K219 Gastro-esophageal reflux disease without esophagitis: Secondary | ICD-10-CM | POA: Diagnosis not present

## 2023-09-24 DIAGNOSIS — E66812 Obesity, class 2: Secondary | ICD-10-CM | POA: Diagnosis not present

## 2023-09-24 DIAGNOSIS — I152 Hypertension secondary to endocrine disorders: Secondary | ICD-10-CM | POA: Diagnosis not present

## 2023-09-24 DIAGNOSIS — E1169 Type 2 diabetes mellitus with other specified complication: Secondary | ICD-10-CM

## 2023-09-24 DIAGNOSIS — D869 Sarcoidosis, unspecified: Secondary | ICD-10-CM

## 2023-09-24 DIAGNOSIS — Z6838 Body mass index (BMI) 38.0-38.9, adult: Secondary | ICD-10-CM

## 2023-09-24 DIAGNOSIS — Z7984 Long term (current) use of oral hypoglycemic drugs: Secondary | ICD-10-CM | POA: Diagnosis not present

## 2023-09-24 LAB — POCT GLYCOSYLATED HEMOGLOBIN (HGB A1C): Hemoglobin A1C: 6.2 % — AB (ref 4.0–5.6)

## 2023-09-24 LAB — CBC WITH DIFFERENTIAL/PLATELET
Basophils Absolute: 0.1 K/uL (ref 0.0–0.1)
Basophils Relative: 0.7 % (ref 0.0–3.0)
Eosinophils Absolute: 0.2 K/uL (ref 0.0–0.7)
Eosinophils Relative: 2.3 % (ref 0.0–5.0)
HCT: 34.8 % — ABNORMAL LOW (ref 36.0–46.0)
Hemoglobin: 10.3 g/dL — ABNORMAL LOW (ref 12.0–15.0)
Lymphocytes Relative: 31.3 % (ref 12.0–46.0)
Lymphs Abs: 2.3 K/uL (ref 0.7–4.0)
MCHC: 29.4 g/dL — ABNORMAL LOW (ref 30.0–36.0)
MCV: 70.1 fl — ABNORMAL LOW (ref 78.0–100.0)
Monocytes Absolute: 0.3 K/uL (ref 0.1–1.0)
Monocytes Relative: 4.8 % (ref 3.0–12.0)
Neutro Abs: 4.4 K/uL (ref 1.4–7.7)
Neutrophils Relative %: 60.9 % (ref 43.0–77.0)
Platelets: 302 K/uL (ref 150.0–400.0)
RBC: 4.97 Mil/uL (ref 3.87–5.11)
RDW: 16.9 % — ABNORMAL HIGH (ref 11.5–15.5)
WBC: 7.2 K/uL (ref 4.0–10.5)

## 2023-09-24 LAB — MICROALBUMIN / CREATININE URINE RATIO
Creatinine,U: 179.9 mg/dL
Microalb Creat Ratio: 1.4 mg/g (ref 0.0–30.0)
Microalb, Ur: 2.4 mg/dL — ABNORMAL HIGH (ref 0.0–1.9)

## 2023-09-24 LAB — LIPID PANEL
Cholesterol: 188 mg/dL (ref 0–200)
HDL: 50.7 mg/dL (ref >39.00)
LDL Cholesterol: 127 mg/dL — ABNORMAL HIGH (ref 0–99)
NonHDL: 137.23
Total CHOL/HDL Ratio: 4
Triglycerides: 50 mg/dL (ref 0.0–149.0)
VLDL: 10 mg/dL (ref 0.0–40.0)

## 2023-09-24 LAB — COMPREHENSIVE METABOLIC PANEL WITH GFR
ALT: 11 U/L (ref 0–35)
AST: 14 U/L (ref 0–37)
Albumin: 4.1 g/dL (ref 3.5–5.2)
Alkaline Phosphatase: 105 U/L (ref 39–117)
BUN: 18 mg/dL (ref 6–23)
CO2: 29 meq/L (ref 19–32)
Calcium: 9.8 mg/dL (ref 8.4–10.5)
Chloride: 106 meq/L (ref 96–112)
Creatinine, Ser: 1.14 mg/dL (ref 0.40–1.20)
GFR: 48.82 mL/min — ABNORMAL LOW
Glucose, Bld: 96 mg/dL (ref 70–99)
Potassium: 3.6 meq/L (ref 3.5–5.1)
Sodium: 141 meq/L (ref 135–145)
Total Bilirubin: 0.3 mg/dL (ref 0.2–1.2)
Total Protein: 8 g/dL (ref 6.0–8.3)

## 2023-09-24 MED ORDER — FARXIGA 10 MG PO TABS
10.0000 mg | ORAL_TABLET | Freq: Every day | ORAL | 3 refills | Status: DC
Start: 2023-09-24 — End: 2023-10-12

## 2023-09-24 MED ORDER — RYBELSUS 7 MG PO TABS
7.0000 mg | ORAL_TABLET | Freq: Every day | ORAL | 3 refills | Status: DC
Start: 2023-09-24 — End: 2024-07-04

## 2023-09-24 NOTE — Assessment & Plan Note (Signed)
Diet and nutrition discussed Benefits of exercise discussed Advised to decrease amount of daily carbohydrate intake and daily calories and increase amount of plant based protein in her diet.

## 2023-09-24 NOTE — Assessment & Plan Note (Signed)
Clinically stable and asymptomatic Continues omeprazole 40 mg daily as needed

## 2023-09-24 NOTE — Assessment & Plan Note (Addendum)
Advised to stay well-hydrated and avoid NSAIDs Eileen Jefferson too expensive

## 2023-09-24 NOTE — Patient Instructions (Signed)

## 2023-09-24 NOTE — Assessment & Plan Note (Signed)
Stable chronic condition.  No complications

## 2023-09-24 NOTE — Assessment & Plan Note (Signed)
BP Readings from Last 3 Encounters:  09/24/23 (!) 146/88  07/05/23 130/86  05/30/22 138/76  Elevated blood pressure in the office but normal readings at home Continue valsartan 80 mg daily Well-controlled diabetes with hemoglobin A1c of 6.2 Farxiga too expensive Recommend to switch to GLP-1 agonist Wants to try orals before injectables Recommend Rybelsus 7 mg daily Cardiovascular risks associated with hypertension and diabetes discussed Diet and nutrition discussed Follow-up in 3 months

## 2023-09-24 NOTE — Progress Notes (Signed)
Eileen Jefferson 72 y.o.   Chief Complaint  Patient presents with   Annual Exam    No concerns     HISTORY OF PRESENT ILLNESS: This is a 72 y.o. female here for 22-month follow-up of chronic medical conditions including diabetes and hypertension States Eileen Jefferson is too expensive Concerned about central obesity No other complaints or medical concerns today. Lab Results  Component Value Date   HGBA1C 6.7 (H) 03/14/2022   Wt Readings from Last 3 Encounters:  09/24/23 215 lb 6 oz (97.7 kg)  07/05/23 211 lb (95.7 kg)  03/27/23 214 lb (97.1 kg)     HPI   Prior to Admission medications   Medication Sig Start Date End Date Taking? Authorizing Provider  Accu-Chek Softclix Lancets lancets TEST BLOOD SUGAR ONE TIME DAILY 10/19/22  Yes Georgina Quint, MD  Alcohol Swabs (DROPSAFE ALCOHOL PREP) 70 % PADS USE TO BLOOD SUGAR EVERY DAY 10/19/22  Yes Georgina Quint, MD  allopurinol (ZYLOPRIM) 100 MG tablet TAKE 1 TABLET EVERY DAY 03/15/23  Yes Georgina Quint, MD  aspirin EC 81 MG tablet Take 81 mg by mouth daily.   Yes [provider]  Blood Glucose Calibration (ACCU-CHEK AVIVA) SOLN Test blood sugar once daily. Dx: E11.9 10/25/16  Yes Sherren Mocha, MD  Blood Glucose Monitoring Suppl (ACCU-CHEK AVIVA PLUS) w/Device KIT Test blood sugar once daily. Dx: E11.9 10/25/16  Yes Sherren Mocha, MD  carboxymethylcellulose (REFRESH PLUS) 0.5 % SOLN 1 drop daily as needed.   Yes [provider]  colchicine 0.6 MG tablet Take 1 tablet (0.6 mg total) by mouth daily as needed (gout or psuedogout pain). 10/18/21  Yes Rodolph Bong, MD  famotidine (PEPCID) 40 MG tablet TAKE 1 TABLET (40 MG TOTAL) BY MOUTH 2  TIMES DAILY AS NEEDED FOR HEARTBURN OR INDIGESTION. 12/13/22  Yes Dacoda Spallone, [name], MD  FARXIGA 10 MG TABS tablet Take 10 mg by mouth daily. 04/17/22  Yes [provider]  glucose blood (ACCU-CHEK AVIVA PLUS) test strip TEST BLOOD SUGAR ONE TIME DAILY 10/19/22   Yes Marelyn Rouser, [name], MD  Latanoprost PF (IYUZEH) 0.005 % SOLN Apply to eye at bedtime. Drop in each eye at bedtime   Yes [provider]  omeprazole (PRILOSEC) 40 MG capsule TAKE 1 CAPSULE (40 MG TOTAL) DAILY 30 TO 60 MINUTES BEFORE BREAKFAST 10/19/22  Yes Rubee Vega, [name], MD  rosuvastatin (CRESTOR) 40 MG tablet TAKE 1 TABLET EVERY DAY 02/18/23  Yes Georgina Quint, MD  valsartan (DIOVAN) 80 MG tablet TAKE 1 TABLET EVERY DAY 01/01/23  Yes Georgina Quint, MD    Allergies  Allergen Reactions   Contrast Media [Iodinated Contrast Media]    Iohexol Other (See Comments)    " made me feel like I was burning inside"    Patient Active Problem List   Diagnosis Date Noted   Bilateral foot pain 03/14/2022   Diabetes (HCC) 09/25/2021   Achilles tendonitis 09/23/2021   Stage 3a chronic kidney disease (HCC) 11/08/2020   Retinal telangiectasia of right eye 05/18/2020   Retinal telangiectasia of left eye 05/18/2020   Cystoid macular edema of left eye 05/18/2020   Hyperuricemia 04/27/2020   Gastroesophageal reflux disease 11/23/2018   DDD (degenerative disc disease), cervical 07/11/2017   Localized osteoarthrosis of right shoulder region 07/11/2017   Anemia 07/11/2017   Hyperlipidemia 06/28/2016   Thyroid disease 06/28/2016   Diabetic retinopathy (HCC) 05/05/2015   Class 2 severe obesity due to excess calories  with serious comorbidity and body mass index (BMI) of 38.0 to 38.9 in adult [facility]) 01/11/2015   Glaucoma 04/06/2014   Macular degeneration 04/06/2014   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, 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   Hypertension associated with diabetes (HCC) 10/02/2013   Sarcoidosis 10/02/2013   Dyslipidemia associated with type 2 diabetes mellitus (HCC) 10/02/2013   OSA (obstructive sleep apnea) 10/23/2012    Past Medical History:  Diagnosis Date   Cataract    DDD (degenerative disc disease), cervical 07/11/2017   C5-6 and C6-7 cervical spondylosis and  degenerative disc disease on 2005 Xray   Diverticulosis    GERD (gastroesophageal reflux disease)    Glaucoma 04/06/2014   HTN (hypertension)    Hyperlipidemia    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, 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   Iron deficiency anemia    Pulmonary sarcoidosis (HCC)    Sleep apnea    uses CPAP   Thyroid disease    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)     Past Surgical History:  Procedure Laterality Date   ABDOMINAL HYSTERECTOMY N/A    [facility] 11/14/2020   APPENDECTOMY     c-section     x 2   CATARACT EXTRACTION     bilateral   CESAREAN SECTION N/A    [facility] 11/14/2020   COLONOSCOPY     EYE SURGERY N/A    [employer] 11/14/2020   SCLERAL BUCKLE     right   TONSILLECTOMY     TOTAL ABDOMINAL HYSTERECTOMY     TUBAL LIGATION     VITRECTOMY     bilateral    Social History   Socioeconomic History   Marital status: Married    Spouse name: Not on file   Number of children: 2   Years of education: Not on file   Highest education level: Not on file  Occupational History   Occupation: behavioral health  Tobacco Use   Smoking status: Former    Current packs/day: 0.00    Average packs/day: 0.2 packs/day for 1 year (0.2 ttl pk-yrs)    Types: Cigarettes    Start date: 12/12/1967    Quit date: 12/11/1968    Years since quitting: 54.8   Smokeless tobacco: Never  Vaping Use   Vaping status: Never Used  Substance and Sexual Activity   Alcohol use: No   Drug use: No   Sexual activity: Not on file  Other Topics Concern   Not on file  Social History Narrative   Not on file   Social Determinants of Health   Financial Resource Strain: Low Risk  (03/27/2023)   Overall Financial Resource Strain (CARDIA)    Difficulty of Paying Living Expenses: Not very hard  Food Insecurity: No Food Insecurity (03/27/2023)   Hunger Vital Sign    Worried About Running Out of Food in the Last Year: Never true    Ran Out of Food in the Last  Year: Never true  Transportation Needs: No Transportation Needs (03/27/2023)   PRAPARE - Administrator, Civil Service (Medical): No    Lack of Transportation (Non-Medical): No  Physical Activity: Inactive (03/27/2023)   Exercise Vital Sign    Days of Exercise per Week: 0 days    Minutes of Exercise per Session: 0 min  Stress: No Stress Concern Present (03/27/2023)   [employer] of Occupational Health - Occupational Stress Questionnaire    Feeling of Stress : Only a little  Social Connections: Unknown (03/27/2023)  Social Advertising account executive [NHANES]    Frequency of Communication with Friends and Family: More than three times a week    Frequency of Social Gatherings with Friends and Family: Once a week    Attends Religious Services: Not on Marketing executive or Organizations: Yes    Attends Banker Meetings: More than 4 times per year    Marital Status: Married  Catering manager Violence: Not At Risk (03/27/2023)   Humiliation, Afraid, Rape, and Kick questionnaire    Fear of Current or Ex-Partner: No    Emotionally Abused: No    Physically Abused: No    Sexually Abused: No    Family History  Problem Relation Age of Onset   Colon polyps Mother        brothers x2   Clotting disorder Mother        brother x 2, MGM   Heart disease Brother        mother, MGM   Diabetes Brother        x 2, MGM   Stroke Maternal Grandmother    Colon cancer Neg Hx    Breast cancer Neg Hx    Esophageal cancer Neg Hx    Rectal cancer Neg Hx    Stomach cancer Neg Hx      Review of Systems  Constitutional: Negative.  Negative for chills and fever.  HENT: Negative.  Negative for congestion and sore throat.   Respiratory: Negative.  Negative for cough and shortness of breath.   Cardiovascular: Negative.  Negative for chest pain and palpitations.  Gastrointestinal:  Negative for abdominal pain, diarrhea, nausea and vomiting.  Genitourinary:  Negative.  Negative for dysuria and hematuria.  Skin: Negative.  Negative for rash.  Neurological: Negative.  Negative for dizziness and headaches.  All other systems reviewed and are negative.   Today's Vitals   09/24/23 1013  BP: (!) 146/88  Pulse: 80  Temp: 98.2 F (36.8 C)  TempSrc: Oral  SpO2: 91%  Weight: 215 lb 6 oz (97.7 kg)  Height: 5' 2.5" (1.588 m)   Body mass index is 38.76 kg/m.   Physical Exam Vitals reviewed.  Constitutional:      Appearance: Normal appearance.  HENT:     Head: Normocephalic.     Mouth/Throat:     Mouth: Mucous membranes are moist.     Pharynx: Oropharynx is clear.  Eyes:     Extraocular Movements: Extraocular movements intact.     Conjunctiva/sclera: Conjunctivae normal.     Pupils: Pupils are equal, round, and reactive to light.  Cardiovascular:     Rate and Rhythm: Normal rate and regular rhythm.  Pulmonary:     Effort: Pulmonary effort is normal.     Breath sounds: Normal breath sounds.  Musculoskeletal:     Right lower leg: No edema.     Left lower leg: No edema.  Skin:    General: Skin is warm and dry.     Capillary Refill: Capillary refill takes less than 2 seconds.  Neurological:     General: No focal deficit present.     Mental Status: She is alert and oriented to person, place, and time.  Psychiatric:        Mood and Affect: Mood normal.        Behavior: Behavior normal.    Results for orders placed or performed in visit on 09/24/23 (from the past 24 hour(s))  POCT HgB A1C  Status: Abnormal   Collection Time: 09/24/23 11:32 AM  Result Value Ref Range   Hemoglobin A1C 6.2 (A) 4.0 - 5.6 %   HbA1c POC (<> result, manual entry)     HbA1c, POC (prediabetic range)     HbA1c, POC (controlled diabetic range)       ASSESSMENT & PLAN: A total of 44 minutes was spent with the patient and counseling/coordination of care regarding preparing for this visit, review of most recent office visit notes, review of multiple  chronic medical conditions under management, review of all medications and changes made, review of most recent blood work results including interpretation of today's hemoglobin A1c, cardiovascular risks associated with hypertension and diabetes, education on nutrition, prognosis, documentation and need for follow-up.  Problem List Items Addressed This Visit       Cardiovascular and Mediastinum   Hypertension associated with diabetes (HCC) - Primary    BP Readings from Last 3 Encounters:  09/24/23 (!) 146/88  07/05/23 130/86  05/30/22 138/76  Elevated blood pressure in the office but normal readings at home Continue valsartan 80 mg daily Well-controlled diabetes with hemoglobin A1c of 6.2 Farxiga too expensive Recommend to switch to GLP-1 agonist Wants to try orals before injectables Recommend Rybelsus 7 mg daily Cardiovascular risks associated with hypertension and diabetes discussed Diet and nutrition discussed Follow-up in 3 months       Relevant Medications   FARXIGA 10 MG TABS tablet   Semaglutide (RYBELSUS) 7 MG TABS   Other Relevant Orders   POCT HgB A1C (Completed)   CBC with Differential/Platelet   Comprehensive metabolic panel   Lipid panel   Urine Microalbumin w/creat. ratio     Digestive   Gastroesophageal reflux disease    Clinically stable and asymptomatic Continues omeprazole 40 mg daily as needed        Endocrine   Dyslipidemia associated with type 2 diabetes mellitus (HCC)    Stable chronic conditions Hemoglobin A1c 6.2 Continue rosuvastatin 40 mg daily Diet and nutrition discussed.      Relevant Medications   FARXIGA 10 MG TABS tablet   Semaglutide (RYBELSUS) 7 MG TABS   Other Relevant Orders   POCT HgB A1C (Completed)   CBC with Differential/Platelet   Comprehensive metabolic panel   Lipid panel   Urine Microalbumin w/creat. ratio     Genitourinary   Stage 3a chronic kidney disease (HCC)    Advised to stay well-hydrated and avoid  NSAIDs Eileen Jefferson too expensive         Other   Sarcoidosis    Stable chronic condition.  No complications.      Class 2 severe obesity due to excess calories with serious comorbidity and body mass index (BMI) of 38.0 to 38.9 in adult [facility])    Diet and nutrition discussed Benefits of exercise discussed Advised to decrease amount of daily carbohydrate intake and daily calories and increase amount of plant-based protein in her diet      Relevant Medications   FARXIGA 10 MG TABS tablet   Semaglutide (RYBELSUS) 7 MG TABS   Patient Instructions  Diabetes Mellitus and Nutrition, Adult When you have diabetes, or diabetes mellitus, it is very important to have healthy eating habits because your blood sugar (glucose) levels are greatly affected by what you eat and drink. Eating healthy foods in the right amounts, at about the same times every day, can help you: Manage your blood glucose. Lower your risk of heart disease. Improve your blood pressure. Reach or  maintain a healthy weight. What can affect my meal plan? Every person with diabetes is different, and each person has different needs for a meal plan. Your health care provider may recommend that you work with a dietitian to make a meal plan that is best for you. Your meal plan may vary depending on factors such as: The calories you need. The medicines you take. Your weight. Your blood glucose, blood pressure, and cholesterol levels. Your activity level. Other health conditions you have, such as heart or kidney disease. How do carbohydrates affect me? Carbohydrates, also called carbs, affect your blood glucose level more than any other type of food. Eating carbs raises the amount of glucose in your blood. It is important to know how many carbs you can safely have in each meal. This is different for every person. Your dietitian can help you calculate how many carbs you should have at each meal and for each snack. How does alcohol affect  me? Alcohol can cause a decrease in blood glucose (hypoglycemia), especially if you use insulin or take certain diabetes medicines by mouth. Hypoglycemia can be a life-threatening condition. Symptoms of hypoglycemia, such as sleepiness, dizziness, and confusion, are similar to symptoms of having too much alcohol. Do not drink alcohol if: Your health care provider tells you not to drink. You are pregnant, may be pregnant, or are planning to become pregnant. If you drink alcohol: Limit how much you have to: 0-1 drink a day for women. 0-2 drinks a day for men. Know how much alcohol is in your drink. In the [place] one drink equals one 12 oz bottle of beer (355 mL), one 5 oz glass of wine (148 mL), or one 1 oz glass of hard liquor (44 mL). Keep yourself hydrated with water, diet soda, or unsweetened iced tea. Keep in mind that regular soda, juice, and other mixers may contain a lot of sugar and must be counted as carbs. What are tips for following this plan?  Reading food labels Start by checking the serving size on the Nutrition Facts label of packaged foods and drinks. The number of calories and the amount of carbs, fats, and other nutrients listed on the label are based on one serving of the item. Many items contain more than one serving per package. Check the total grams (g) of carbs in one serving. Check the number of grams of saturated fats and trans fats in one serving. Choose foods that have a low amount or none of these fats. Check the number of milligrams (mg) of salt (sodium) in one serving. Most people should limit total sodium intake to less than 2,300 mg per day. Always check the nutrition information of foods labeled as "low-fat" or "nonfat." These foods may be higher in added sugar or refined carbs and should be avoided. Talk to your dietitian to identify your daily goals for nutrients listed on the label. Shopping Avoid buying canned, pre-made, or processed foods. These foods tend to  be high in fat, sodium, and added sugar. Shop around the outside edge of the grocery store. This is where you will most often find fresh fruits and vegetables, bulk grains, fresh meats, and fresh dairy products. Cooking Use low-heat cooking methods, such as baking, instead of high-heat cooking methods, such as deep frying. Cook using healthy oils, such as olive, canola, or sunflower oil. Avoid cooking with butter, cream, or high-fat meats. Meal planning Eat meals and snacks regularly, preferably at the same times every day. Avoid going  long periods of time without eating. Eat foods that are high in fiber, such as fresh fruits, vegetables, beans, and whole grains. Eat 4-6 oz (112-168 g) of lean protein each day, such as lean meat, chicken, fish, eggs, or tofu. One ounce (oz) (28 g) of lean protein is equal to: 1 oz (28 g) of meat, chicken, or fish. 1 egg.  cup (62 g) of tofu. Eat some foods each day that contain healthy fats, such as avocado, nuts, seeds, and fish. What foods should I eat? Fruits Berries. Apples. Oranges. Peaches. Apricots. Plums. Grapes. Mangoes. Papayas. Pomegranates. Kiwi. Cherries. Vegetables Leafy greens, including lettuce, spinach, kale, chard, collard greens, mustard greens, and cabbage. Beets. Cauliflower. Broccoli. Carrots. Green beans. Tomatoes. Peppers. Onions. Cucumbers. Brussels sprouts. Grains Whole grains, such as whole-wheat or whole-grain bread, crackers, tortillas, cereal, and pasta. Unsweetened oatmeal. Quinoa. Brown or wild rice. Meats and other proteins Seafood. Poultry without skin. Lean cuts of poultry and beef. Tofu. Nuts. Seeds. Dairy Low-fat or fat-free dairy products such as milk, yogurt, and cheese. The items listed above may not be a complete list of foods and beverages you can eat and drink. Contact a dietitian for more information. What foods should I avoid? Fruits Fruits canned with syrup. Vegetables Canned vegetables. Frozen vegetables  with butter or cream sauce. Grains Refined white flour and flour products such as bread, pasta, snack foods, and cereals. Avoid all processed foods. Meats and other proteins Fatty cuts of meat. Poultry with skin. Breaded or fried meats. Processed meat. Avoid saturated fats. Dairy Full-fat yogurt, cheese, or milk. Beverages Sweetened drinks, such as soda or iced tea. The items listed above may not be a complete list of foods and beverages you should avoid. Contact a dietitian for more information. Questions to ask a health care provider Do I need to meet with a certified diabetes care and education specialist? Do I need to meet with a dietitian? What number can I call if I have questions? When are the best times to check my blood glucose? Where to find more information: American Diabetes Association: diabetes.org Academy of Nutrition and Dietetics: eatright.[employer] of Diabetes and Digestive and Kidney Diseases: StageSync.si Association of Diabetes Care & Education Specialists: diabeteseducator.org Summary It is important to have healthy eating habits because your blood sugar (glucose) levels are greatly affected by what you eat and drink. It is important to use alcohol carefully. A healthy meal plan will help you manage your blood glucose and lower your risk of heart disease. Your health care provider may recommend that you work with a dietitian to make a meal plan that is best for you. This information is not intended to replace advice given to you by your health care provider. Make sure you discuss any questions you have with your health care provider. Document Revised: 06/30/2020 Document Reviewed: 06/30/2020 Elsevier Patient Education  2024 Elsevier Inc.     Edwina Barth, MD [facility]

## 2023-09-24 NOTE — Assessment & Plan Note (Signed)
Stable chronic conditions Hemoglobin A1c 6.2 Continue rosuvastatin 40 mg daily Diet and nutrition discussed.

## 2023-10-02 DIAGNOSIS — H401132 Primary open-angle glaucoma, bilateral, moderate stage: Secondary | ICD-10-CM | POA: Diagnosis not present

## 2023-10-02 DIAGNOSIS — H35072 Retinal telangiectasis, left eye: Secondary | ICD-10-CM | POA: Diagnosis not present

## 2023-10-02 DIAGNOSIS — G4733 Obstructive sleep apnea (adult) (pediatric): Secondary | ICD-10-CM | POA: Diagnosis not present

## 2023-10-02 DIAGNOSIS — H35071 Retinal telangiectasis, right eye: Secondary | ICD-10-CM | POA: Diagnosis not present

## 2023-10-11 DIAGNOSIS — E113511 Type 2 diabetes mellitus with proliferative diabetic retinopathy with macular edema, right eye: Secondary | ICD-10-CM | POA: Diagnosis not present

## 2023-10-11 DIAGNOSIS — H401131 Primary open-angle glaucoma, bilateral, mild stage: Secondary | ICD-10-CM | POA: Diagnosis not present

## 2023-10-11 DIAGNOSIS — H3523 Other non-diabetic proliferative retinopathy, bilateral: Secondary | ICD-10-CM | POA: Diagnosis not present

## 2023-10-11 DIAGNOSIS — H35351 Cystoid macular degeneration, right eye: Secondary | ICD-10-CM | POA: Diagnosis not present

## 2023-10-11 DIAGNOSIS — H35073 Retinal telangiectasis, bilateral: Secondary | ICD-10-CM | POA: Diagnosis not present

## 2023-10-12 ENCOUNTER — Ambulatory Visit (INDEPENDENT_AMBULATORY_CARE_PROVIDER_SITE_OTHER): Payer: Medicare HMO | Admitting: Family Medicine

## 2023-10-12 ENCOUNTER — Encounter: Payer: Self-pay | Admitting: Family Medicine

## 2023-10-12 VITALS — BP 128/88 | HR 91 | Temp 97.3°F | Ht 62.5 in | Wt 217.0 lb

## 2023-10-12 DIAGNOSIS — J3489 Other specified disorders of nose and nasal sinuses: Secondary | ICD-10-CM

## 2023-10-12 DIAGNOSIS — M25512 Pain in left shoulder: Secondary | ICD-10-CM

## 2023-10-12 DIAGNOSIS — R051 Acute cough: Secondary | ICD-10-CM

## 2023-10-12 DIAGNOSIS — R0981 Nasal congestion: Secondary | ICD-10-CM | POA: Diagnosis not present

## 2023-10-12 DIAGNOSIS — J040 Acute laryngitis: Secondary | ICD-10-CM | POA: Diagnosis not present

## 2023-10-12 LAB — POC COVID19 BINAXNOW: SARS Coronavirus 2 Ag: NEGATIVE

## 2023-10-12 MED ORDER — AMOXICILLIN-POT CLAVULANATE 500-125 MG PO TABS
1.0000 | ORAL_TABLET | Freq: Two times a day (BID) | ORAL | 0 refills | Status: DC
Start: 2023-10-12 — End: 2023-11-21

## 2023-10-12 MED ORDER — IBUPROFEN 600 MG PO TABS
600.0000 mg | ORAL_TABLET | Freq: Three times a day (TID) | ORAL | 0 refills | Status: DC | PRN
Start: 2023-10-12 — End: 2023-12-25

## 2023-10-12 NOTE — Progress Notes (Signed)
Subjective:  Eileen Jefferson is a 72 y.o. female who presents for a 2 day history of nasal congestion, rhinorrhea, post nasal drainage, hoarseness  and cough with green phlegm.    Left shoulder and left arm pain x 2 weeks. Aching. NKI.  Cannot sleep on her left shoulder. Pain worse with certain movements.  Taking Tylenol   No neck pain. No weakness.   Denies fever, chills, dizziness, chest pain, palpitations, shortness of breath, abdominal pain, N/V/D, LE edema.    ROS as in subjective.   Objective: Vitals:   10/12/23 1425  BP: 128/88  Pulse: 91  Temp: (!) 97.3 F (36.3 C)  SpO2: 97%    General appearance: Alert, WD/WN, no distress                             Skin: warm, no rash                           Head: no sinus tenderness                            Eyes: conjunctiva normal, corneas clear, PERRLA                            Ears: pearly TMs, external ear canals normal                          Nose: septum midline, turbinates swollen, with erythema and clear discharge             Mouth/throat: MMM, tongue normal, mild pharyngeal erythema                           Neck: supple, no adenopathy, no thyromegaly, nontender                          Heart: RRR                         Lungs: CTA bilaterally, no wheezes, rales, or rhonchi Extremities: left shoulder with TTP anteriorly. Good sensation, ROM and strength. Negative Neers and Hawkins.  LUE is neurovascularly intact       Assessment: Acute cough - Plan: POC COVID-19, amoxicillin-clavulanate (AUGMENTIN) 500-125 MG tablet  Nasal congestion with rhinorrhea - Plan: POC COVID-19  Acute pain of left shoulder - Plan: ibuprofen (ADVIL) 600 MG tablet  Laryngitis   Plan: COVID test is negative. Discussed symptomatic treatment. Augmentin prescribed in case she is worsening over the weekend or not improving at all in the next 2 to 3 days. MSK etiology for shoulder and upper arm pain.  Recommend over-the-counter  Voltaren gel and taking ibuprofen as needed for pain.  No injury.  Imaging not warranted.  Follow-up if not improving

## 2023-10-12 NOTE — Patient Instructions (Signed)
Continue treating your symptoms with over-the-counter cold and cough medications.  Stay well-hydrated.  Do salt water gargles for your throat.  Saline nasal spray for congestion.  If you are not significantly improving by Monday or if you get worse tomorrow then start the antibiotic.  Take it with food.  Your shoulder, take ibuprofen with food and plenty of water.  Use over-the-counter Voltaren gel.  You can also use a heating pad. If the pain does not improve or if it gets much worse, you will need to be seen again.

## 2023-10-16 ENCOUNTER — Ambulatory Visit: Payer: Medicare HMO

## 2023-10-16 ENCOUNTER — Encounter: Payer: Self-pay | Admitting: Emergency Medicine

## 2023-10-16 ENCOUNTER — Ambulatory Visit: Payer: Medicare HMO | Admitting: Emergency Medicine

## 2023-10-16 VITALS — BP 134/80 | HR 82 | Temp 98.2°F | Ht 62.5 in | Wt 214.4 lb

## 2023-10-16 DIAGNOSIS — M19012 Primary osteoarthritis, left shoulder: Secondary | ICD-10-CM | POA: Diagnosis not present

## 2023-10-16 DIAGNOSIS — M5030 Other cervical disc degeneration, unspecified cervical region: Secondary | ICD-10-CM | POA: Diagnosis not present

## 2023-10-16 DIAGNOSIS — E1169 Type 2 diabetes mellitus with other specified complication: Secondary | ICD-10-CM

## 2023-10-16 DIAGNOSIS — D869 Sarcoidosis, unspecified: Secondary | ICD-10-CM | POA: Diagnosis not present

## 2023-10-16 DIAGNOSIS — E785 Hyperlipidemia, unspecified: Secondary | ICD-10-CM

## 2023-10-16 DIAGNOSIS — M25512 Pain in left shoulder: Secondary | ICD-10-CM

## 2023-10-16 DIAGNOSIS — M5412 Radiculopathy, cervical region: Secondary | ICD-10-CM

## 2023-10-16 DIAGNOSIS — E1159 Type 2 diabetes mellitus with other circulatory complications: Secondary | ICD-10-CM

## 2023-10-16 DIAGNOSIS — Z7984 Long term (current) use of oral hypoglycemic drugs: Secondary | ICD-10-CM

## 2023-10-16 DIAGNOSIS — I152 Hypertension secondary to endocrine disorders: Secondary | ICD-10-CM

## 2023-10-16 MED ORDER — MELOXICAM 15 MG PO TABS: 15.0000 mg | ORAL_TABLET | Freq: Every day | ORAL | 0 refills | Status: AC

## 2023-10-16 NOTE — Assessment & Plan Note (Signed)
BP Readings from Last 3 Encounters:  10/16/23 134/80  10/12/23 128/88  09/24/23 130/70  Well-controlled hypertension. Continues valsartan 80 mg daily Lab Results  Component Value Date   HGBA1C 6.2 (A) 09/24/2023  Well-controlled diabetes Continues Rybelsus 7 mg daily

## 2023-10-16 NOTE — Assessment & Plan Note (Signed)
Most likely contributing to today's symptoms May need orthopedic evaluation Recommend meloxicam 15 mg for 10 days

## 2023-10-16 NOTE — Assessment & Plan Note (Signed)
Stable chronic conditions Continue Rybelsus 7 mg daily and rosuvastatin 40 mg daily

## 2023-10-16 NOTE — Patient Instructions (Signed)
Shoulder Pain Many things can cause shoulder pain, including: An injury. Moving the shoulder in the same way again and again (overuse). Joint pain (arthritis). Pain can come from: Swelling and irritation (inflammation) of any part of the shoulder. An injury to: The shoulder joint. Tissues that connect muscle to bone (tendons). Tissues that connect bones to each other (ligaments). Bones. Follow these instructions at home: Watch for changes in your symptoms. Let your doctor know about them. Follow these instructions to help with your pain. If you have a sling that can be taken off: Wear the sling as told by your doctor. Take it off only as told by your doctor. Check the skin around the sling every day. Tell your doctor if you see problems. Loosen the sling if your fingers: Tingle. Become numb. Become cold. Keep the sling clean. If the sling is not waterproof: Do not let it get wet. Take the sling off when you shower or bathe. Managing pain, stiffness, and swelling  If told, put ice on the painful area. Put ice in a plastic bag. Place a towel between your skin and the bag. Leave the ice on for 20 minutes, 2-3 times a day. Stop putting ice on if it does not help with the pain. If your skin turns bright red, take off the ice right away to prevent skin damage. The risk of damage is higher if you cannot feel pain, heat, or cold. Squeeze a soft ball or a foam pad as much as possible. This prevents swelling in the shoulder. It also helps to strengthen the arm. General instructions Take over-the-counter and prescription medicines only as told by your doctor. Keep all follow-up visits. This will help you avoid any type of permanent shoulder problems. Contact a doctor if: Your pain gets worse. Medicine does not help your pain. You have new pain in your arm, hand, or fingers. You loosen your sling and your arm, hand, or fingers: Tingle. Are numb. Are swollen. Get help right away  if: Your arm, hand, or fingers turn white or blue. This information is not intended to replace advice given to you by your health care provider. Make sure you discuss any questions you have with your health care provider. Document Revised: 06/30/2022 Document Reviewed: 06/30/2022 Elsevier Patient Education  2024 Elsevier Inc.  

## 2023-10-16 NOTE — Progress Notes (Signed)
Eileen Jefferson 72 y.o.   Chief Complaint  Patient presents with   Shoulder Pain    Patient states she is having some pain in her left shoulder, radiates down her arm Started 3 weeks ago     HISTORY OF PRESENT ILLNESS: This is a 72 y.o. female complaining of dull, achy, constant pain to left shoulder radiating down the left arm along with occasional numbness and tingling that started about 3 weeks ago.  No known triggers.  Denies injury.  Denies associated symptoms such as chest pain or difficulty breathing. No other complaints or medical concerns today.  Shoulder Pain  Pertinent negatives include no fever.     Prior to Admission medications   Medication Sig Start Date End Date Taking? Authorizing Provider  Accu-Chek Softclix Lancets lancets TEST BLOOD SUGAR ONE TIME DAILY 10/19/22  Yes Trellis Guirguis, [name], MD  Alcohol Swabs (DROPSAFE ALCOHOL PREP) 70 % PADS USE TO BLOOD SUGAR EVERY DAY 10/19/22  Yes Sergei Delo, [name], MD  allopurinol (ZYLOPRIM) 100 MG tablet TAKE 1 TABLET EVERY DAY 03/15/23  Yes Latanza Pfefferkorn, [name], MD  amoxicillin-clavulanate (AUGMENTIN) 500-125 MG tablet Take 1 tablet by mouth in the morning and at bedtime. 10/12/23  Yes Henson, Vickie L, NP-C  aspirin EC 81 MG tablet Take 81 mg by mouth daily.   Yes [provider]  Blood Glucose Calibration (ACCU-CHEK AVIVA) SOLN Test blood sugar once daily. Dx: E11.9 10/25/16  Yes Sherren Mocha, MD  Blood Glucose Monitoring Suppl (ACCU-CHEK AVIVA PLUS) w/Device KIT Test blood sugar once daily. Dx: E11.9 10/25/16  Yes Sherren Mocha, MD  carboxymethylcellulose (REFRESH PLUS) 0.5 % SOLN 1 drop daily as needed.   Yes [provider]  colchicine 0.6 MG tablet Take 1 tablet (0.6 mg total) by mouth daily as needed (gout or psuedogout pain). 10/18/21  Yes Rodolph Bong, MD  famotidine (PEPCID) 40 MG tablet TAKE 1 TABLET (40 MG TOTAL) BY MOUTH 2  TIMES DAILY AS NEEDED FOR HEARTBURN OR INDIGESTION. 12/13/22  Yes  Suheily Birks, [name], MD  glucose blood (ACCU-CHEK AVIVA PLUS) test strip TEST BLOOD SUGAR ONE TIME DAILY 10/19/22  Yes Hansford Hirt, [name], MD  ibuprofen (ADVIL) 600 MG tablet Take 1 tablet (600 mg total) by mouth every 8 (eight) hours as needed. 10/12/23  Yes Henson, Vickie L, NP-C  Latanoprost PF (IYUZEH) 0.005 % SOLN Apply to eye at bedtime. Drop in each eye at bedtime   Yes [provider]  omeprazole (PRILOSEC) 40 MG capsule TAKE 1 CAPSULE (40 MG TOTAL) DAILY 30 TO 60 MINUTES BEFORE BREAKFAST 10/19/22  Yes Stefana Lodico, [name], MD  rosuvastatin (CRESTOR) 40 MG tablet TAKE 1 TABLET EVERY DAY 02/18/23  Yes Ryett Hamman, [name], MD  Semaglutide (RYBELSUS) 7 MG TABS Take 1 tablet (7 mg total) by mouth daily. 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   Hyperuricemia 04/27/2020   Gastroesophageal reflux disease 11/23/2018   DDD (degenerative disc disease), cervical 07/11/2017   Localized osteoarthrosis of right shoulder region 07/11/2017   Anemia 07/11/2017   Hyperlipidemia 06/28/2016   Thyroid disease 06/28/2016   Diabetic retinopathy (HCC) 05/05/2015   Class 2 severe obesity due to excess calories with serious comorbidity and body mass index (BMI) of 38.0 to 38.9 in adult [facility]) 01/11/2015   Glaucoma 04/06/2014   Macular degeneration 04/06/2014   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, 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   Hypertension associated with diabetes (HCC) 10/02/2013   Sarcoidosis 10/02/2013   Dyslipidemia associated with type 2 diabetes mellitus (HCC) 10/02/2013   OSA (obstructive sleep apnea) 10/23/2012    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   Iron deficiency anemia    Pulmonary sarcoidosis (HCC)    Sleep apnea    uses CPAP   Thyroid disease    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)     Past Surgical History:  Procedure Laterality Date   ABDOMINAL HYSTERECTOMY N/A    [facility] 11/14/2020   APPENDECTOMY     c-section     x 2   CATARACT EXTRACTION     bilateral   CESAREAN SECTION N/A    [facility] 11/14/2020   COLONOSCOPY     EYE SURGERY N/A    [facility] 11/14/2020   SCLERAL BUCKLE     right   TONSILLECTOMY     TOTAL ABDOMINAL HYSTERECTOMY     TUBAL LIGATION     VITRECTOMY     bilateral    Social History   Socioeconomic History   Marital status: Married    Spouse name: Not on file   Number of children: 2   Years of education: Not on file   Highest education level: Not on file  Occupational History   Occupation: behavioral health  Tobacco Use   Smoking status: Former    Current packs/day: 0.00    Average packs/day: 0.2 packs/day for 1 year (0.2 ttl pk-yrs)    Types: Cigarettes    Start date: 12/12/1967    Quit date: 12/11/1968    Years since quitting: 54.8   Smokeless tobacco: Never  Vaping Use   Vaping status: Never Used  Substance and Sexual Activity   Alcohol use: No   Drug use: No   Sexual activity: Not on file  Other Topics Concern   Not on file  Social History Narrative   Not  on file   Social Determinants of Health   Financial Resource Strain: Low Risk  (03/27/2023)   Overall Financial Resource Strain (CARDIA)    Difficulty of Paying Living Expenses: Not very hard  Food Insecurity: No Food Insecurity (03/27/2023)   Hunger Vital Sign    Worried About Running Out of Food in the Last Year: Never true    Ran Out of Food in the Last Year: Never true  Transportation Needs: No Transportation Needs (03/27/2023)   PRAPARE - Administrator, Civil Service (Medical): No    Lack of Transportation (Non-Medical): No  Physical Activity: Inactive (  03/27/2023)   Exercise Vital Sign    Days of Exercise per Week: 0 days    Minutes of Exercise per Session: 0 min  Stress: No Stress Concern Present (03/27/2023)   [employer] of Occupational Health - Occupational Stress Questionnaire    Feeling of Stress : Only a little  Social Connections: Unknown (03/27/2023)   Social Connection and Isolation Panel [NHANES]    Frequency of Communication with Friends and Family: More than three times a week    Frequency of Social Gatherings with Friends and Family: Once a week    Attends Religious Services: Not on Marketing executive or Organizations: Yes    Attends Banker Meetings: More than 4 times per year    Marital Status: Married  Catering manager Violence: Not At Risk (03/27/2023)   Humiliation, Afraid, Rape, and Kick questionnaire    Fear of Current or Ex-Partner: No    Emotionally Abused: No    Physically Abused: No    Sexually Abused: No    Family History  Problem Relation Age of Onset   Colon polyps Mother        brothers x2   Clotting disorder Mother        brother x 2, MGM   Heart disease Brother        mother, MGM   Diabetes Brother        x 2, MGM   Stroke Maternal Grandmother    Colon cancer Neg Hx    Breast cancer Neg Hx    Esophageal cancer Neg Hx    Rectal cancer Neg Hx    Stomach cancer Neg Hx      Review of Systems   Constitutional: Negative.  Negative for chills and fever.  HENT: Negative.  Negative for congestion and sore throat.   Respiratory: Negative.  Negative for cough and shortness of breath.   Cardiovascular: Negative.  Negative for chest pain and palpitations.  Gastrointestinal:  Negative for abdominal pain, diarrhea, nausea and vomiting.  Genitourinary: Negative.  Negative for dysuria and hematuria.  Skin: Negative.  Negative for rash.  Neurological: Negative.  Negative for dizziness and headaches.  All other systems reviewed and are negative.   Vitals:   10/16/23 1427  BP: 134/80  Pulse: 82  Temp: 98.2 F (36.8 C)  SpO2: 95%    Physical Exam Vitals reviewed.  Constitutional:      Appearance: Normal appearance.  HENT:     Head: Normocephalic.  Eyes:     Extraocular Movements: Extraocular movements intact.  Cardiovascular:     Rate and Rhythm: Normal rate and regular rhythm.     Pulses: Normal pulses.     Heart sounds: Normal heart sounds.  Pulmonary:     Effort: Pulmonary effort is normal.     Breath sounds: Normal breath sounds.  Abdominal:     Palpations: Abdomen is soft.  Musculoskeletal:     Cervical back: No tenderness.  Lymphadenopathy:     Cervical: No cervical adenopathy.  Skin:    General: Skin is warm and dry.  Neurological:     General: No focal deficit present.     Mental Status: She is alert and oriented to person, place, and time.  Psychiatric:        Mood and Affect: Mood normal.        Behavior: Behavior normal.    EKG: Normal sinus rhythm with ventricular rate of 98/min.  No acute ischemic changes.  LVH changes.  ASSESSMENT & PLAN: A total of 44 minutes was spent with the patient and counseling/coordination of care regarding preparing for this visit, review of most recent office visit notes, review of multiple chronic medical conditions under management, review of all medications, differential diagnosis of left shoulder pain and possible cervical  radiculopathy, pain management, prognosis, review of today's x-rays, documentation, and need for follow-up.  Problem List Items Addressed This Visit       Cardiovascular and Mediastinum   Hypertension associated with diabetes (HCC)    BP Readings from Last 3 Encounters:  10/16/23 134/80  10/12/23 128/88  09/24/23 130/70  Well-controlled hypertension. Continues valsartan 80 mg daily Lab Results  Component Value Date   HGBA1C 6.2 (A) 09/24/2023  Well-controlled diabetes Continues Rybelsus 7 mg daily          Endocrine   Dyslipidemia associated with type 2 diabetes mellitus (HCC)    Stable chronic conditions Continue Rybelsus 7 mg daily and rosuvastatin 40 mg daily        Nervous and Auditory   Cervical radiculopathy    Most likely secondary to cervical spine disc disease. Her normal cervical spine x-rays in 2005 Most likely progressive disease Recommend meloxicam 15 mg daily for 10 days May need orthopedic evaluation and possible physical therapy Normal EKG.  Doubt cardiac etiology.      Relevant Medications   meloxicam (MOBIC) 15 MG tablet   Other Relevant Orders   EKG 12-Lead   DG Shoulder Left   DG Chest 2 View     Musculoskeletal and Integument   DDD (degenerative disc disease), cervical (Chronic)    Most likely contributing to today's symptoms May need orthopedic evaluation Recommend meloxicam 15 mg for 10 days      Relevant Medications   meloxicam (MOBIC) 15 MG tablet     Other   Acute pain of left shoulder - Primary    Unremarkable physical examination. Most likely related to cervical spine disease and possible cervical radiculopathy Recommend meloxicam 15 mg daily for 10 days May need orthopedic evaluation X-rays done today.  Report reviewed.      Relevant Medications   meloxicam (MOBIC) 15 MG tablet   Other Relevant Orders   EKG 12-Lead   DG Shoulder Left   DG Chest 2 View     Patient Instructions  Shoulder Pain Many things can cause  shoulder pain, including: An injury. Moving the shoulder in the same way again and again (overuse). Joint pain (arthritis). Pain can come from: Swelling and irritation (inflammation) of any part of the shoulder. An injury to: The shoulder joint. Tissues that connect muscle to bone (tendons). Tissues that connect bones to each other (ligaments). Bones. Follow these instructions at home: Watch for changes in your symptoms. Let your doctor know about them. Follow these instructions to help with your pain. If you have a sling that can be taken off: Wear the sling as told by your doctor. Take it off only as told by your doctor. Check the skin around the sling every day. Tell your doctor if you see problems. Loosen the sling if your fingers: Tingle. Become numb. Become cold. Keep the sling clean. If the sling is not waterproof: Do not let it get wet. Take the sling off when you shower or bathe. Managing pain, stiffness, and swelling  If told, put ice on the painful area. Put ice in a plastic bag. Place a towel between your skin and the bag. Leave the ice  on for 20 minutes, 2-3 times a day. Stop putting ice on if it does not help with the pain. If your skin turns bright red, take off the ice right away to prevent skin damage. The risk of damage is higher if you cannot feel pain, heat, or cold. Squeeze a soft ball or a foam pad as much as possible. This prevents swelling in the shoulder. It also helps to strengthen the arm. General instructions Take over-the-counter and prescription medicines only as told by your doctor. Keep all follow-up visits. This will help you avoid any type of permanent shoulder problems. Contact a doctor if: Your pain gets worse. Medicine does not help your pain. You have new pain in your arm, hand, or fingers. You loosen your sling and your arm, hand, or fingers: Tingle. Are numb. Are swollen. Get help right away if: Your arm, hand, or fingers turn white  or blue. This information is not intended to replace advice given to you by your health care provider. Make sure you discuss any questions you have with your health care provider. Document Revised: 06/30/2022 Document Reviewed: 06/30/2022 Elsevier Patient Education  2024 Elsevier Inc.    Edwina Barth, MD  Primary Care at [facility]

## 2023-10-16 NOTE — Assessment & Plan Note (Addendum)
Unremarkable physical examination. Most likely related to cervical spine disease and possible cervical radiculopathy Recommend meloxicam 15 mg daily for 10 days May need orthopedic evaluation X-rays done today.  Report reviewed.

## 2023-10-16 NOTE — Assessment & Plan Note (Signed)
Most likely secondary to cervical spine disc disease. Her normal cervical spine x-rays in 2005 Most likely progressive disease Recommend meloxicam 15 mg daily for 10 days May need orthopedic evaluation and possible physical therapy Normal EKG.  Doubt cardiac etiology.

## 2023-10-20 ENCOUNTER — Other Ambulatory Visit: Payer: Self-pay | Admitting: Emergency Medicine

## 2023-10-20 DIAGNOSIS — E1165 Type 2 diabetes mellitus with hyperglycemia: Secondary | ICD-10-CM

## 2023-10-20 DIAGNOSIS — E1169 Type 2 diabetes mellitus with other specified complication: Secondary | ICD-10-CM

## 2023-10-20 DIAGNOSIS — I152 Hypertension secondary to endocrine disorders: Secondary | ICD-10-CM

## 2023-11-05 ENCOUNTER — Ambulatory Visit: Payer: Medicare HMO | Admitting: Family Medicine

## 2023-11-06 ENCOUNTER — Ambulatory Visit (INDEPENDENT_AMBULATORY_CARE_PROVIDER_SITE_OTHER): Payer: Medicare HMO | Admitting: Emergency Medicine

## 2023-11-06 ENCOUNTER — Ambulatory Visit (INDEPENDENT_AMBULATORY_CARE_PROVIDER_SITE_OTHER): Payer: Medicare HMO

## 2023-11-06 ENCOUNTER — Encounter: Payer: Self-pay | Admitting: Emergency Medicine

## 2023-11-06 VITALS — BP 134/90 | HR 81 | Temp 98.4°F | Ht 62.5 in | Wt 213.0 lb

## 2023-11-06 DIAGNOSIS — M150 Primary generalized (osteo)arthritis: Secondary | ICD-10-CM | POA: Insufficient documentation

## 2023-11-06 DIAGNOSIS — M549 Dorsalgia, unspecified: Secondary | ICD-10-CM

## 2023-11-06 DIAGNOSIS — M7918 Myalgia, other site: Secondary | ICD-10-CM

## 2023-11-06 DIAGNOSIS — M542 Cervicalgia: Secondary | ICD-10-CM | POA: Diagnosis not present

## 2023-11-06 DIAGNOSIS — G8929 Other chronic pain: Secondary | ICD-10-CM | POA: Diagnosis not present

## 2023-11-06 DIAGNOSIS — M47812 Spondylosis without myelopathy or radiculopathy, cervical region: Secondary | ICD-10-CM | POA: Diagnosis not present

## 2023-11-06 MED ORDER — TRAMADOL HCL 50 MG PO TABS: 50.0000 mg | ORAL_TABLET | Freq: Three times a day (TID) | ORAL | 1 refills | Status: AC | PRN

## 2023-11-06 NOTE — Progress Notes (Signed)
Eileen Jefferson 72 y.o.   Chief Complaint  Patient presents with   Back Pain    Patient c/o back/ shoulder/ arm pain has been in the office 3x for the same issue    HISTORY OF PRESENT ILLNESS: This is a 72 y.o. female complaining of chronic pain to neck and upper back area Denies injury.  Has history of osteoarthritis No other complaints or medical concerns today.  Back Pain Pertinent negatives include no abdominal pain, chest pain, dysuria, fever or headaches.     Prior to Admission medications   Medication Sig Start Date End Date Taking? Authorizing Provider  ACCU-CHEK AVIVA PLUS test strip TEST BLOOD SUGAR ONE TIME DAILY 10/20/23  Yes Kabrina Christiano, [name], MD  Accu-Chek Softclix Lancets lancets TEST BLOOD SUGAR ONE TIME DAILY 10/20/23  Yes Georgina Quint, MD  Alcohol Swabs (DROPSAFE ALCOHOL PREP) 70 % PADS USE TO BLOOD SUGAR EVERY DAY 10/19/22  Yes Reichen Hutzler, [name], MD  amoxicillin-clavulanate (AUGMENTIN) 500-125 MG tablet Take 1 tablet by mouth in the morning and at bedtime. 10/12/23  Yes Henson, Vickie L, NP-C  aspirin EC 81 MG tablet Take 81 mg by mouth daily.   Yes [provider]  Blood Glucose Calibration (ACCU-CHEK AVIVA) SOLN Test blood sugar once daily. Dx: E11.9 10/25/16  Yes Sherren Mocha, MD  Blood Glucose Monitoring Suppl (ACCU-CHEK AVIVA PLUS) w/Device KIT Test blood sugar once daily. Dx: E11.9 10/25/16  Yes Sherren Mocha, MD  carboxymethylcellulose (REFRESH PLUS) 0.5 % SOLN 1 drop daily as needed.   Yes [provider]  colchicine 0.6 MG tablet Take 1 tablet (0.6 mg total) by mouth daily as needed (gout or psuedogout pain). 10/18/21  Yes Rodolph Bong, MD  famotidine (PEPCID) 40 MG tablet TAKE 1 TABLET (40 MG TOTAL) BY MOUTH 2  TIMES DAILY AS NEEDED FOR HEARTBURN OR INDIGESTION. 12/13/22  Yes Tiffony Kite, [name], MD  ibuprofen (ADVIL) 600 MG tablet Take 1 tablet (600 mg total) by mouth every 8 (eight) hours as needed. 10/12/23  Yes Henson,  Vickie L, NP-C  Latanoprost PF (IYUZEH) 0.005 % SOLN Apply to eye at bedtime. Drop in each eye at bedtime   Yes [provider]  omeprazole (PRILOSEC) 40 MG capsule TAKE 1 CAPSULE (40 MG TOTAL) DAILY 30 TO 60 MINUTES BEFORE BREAKFAST 10/19/22  Yes Jeniya Flannigan, [name], MD  rosuvastatin (CRESTOR) 40 MG tablet TAKE 1 TABLET EVERY DAY 02/18/23  Yes Nahjae Hoeg, [name], MD  Semaglutide (RYBELSUS) 7 MG TABS Take 1 tablet (7 mg total) by mouth daily. 09/24/23  Yes Georgina Quint, MD  valsartan (DIOVAN) 80 MG tablet TAKE 1 TABLET EVERY DAY 10/20/23  Yes Georgina Quint, MD  allopurinol (ZYLOPRIM) 100 MG tablet TAKE 1 TABLET EVERY DAY Patient not taking: Reported on 11/06/2023 03/15/23   Georgina Quint, MD    Allergies  Allergen Reactions   Contrast Media [Iodinated Contrast Media]    Iohexol Other (See Comments)    " made me feel like I was burning inside"    Patient Active Problem List   Diagnosis Date Noted   Cervical radiculopathy 10/16/2023   Diabetes (HCC) 09/25/2021   Achilles tendonitis 09/23/2021   Stage 3a chronic kidney disease (HCC) 11/08/2020   Retinal telangiectasia of right eye 05/18/2020   Retinal telangiectasia of left eye 05/18/2020   Cystoid macular edema of left eye 05/18/2020   Hyperuricemia 04/27/2020   Gastroesophageal reflux disease 11/23/2018   DDD (degenerative disc disease), cervical  07/11/2017   Localized osteoarthrosis of right shoulder region 07/11/2017   Anemia 07/11/2017   Hyperlipidemia 06/28/2016   Thyroid disease 06/28/2016   Diabetic retinopathy (HCC) 05/05/2015   Class 2 severe obesity due to excess calories with serious comorbidity and body mass index (BMI) of 38.0 to 38.9 in adult [facility]) 01/11/2015   Glaucoma 04/06/2014   Macular degeneration 04/06/2014   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, 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   Hypertension associated with diabetes (HCC) 10/02/2013   Sarcoidosis 10/02/2013   Dyslipidemia associated with  type 2 diabetes mellitus (HCC) 10/02/2013   OSA (obstructive sleep apnea) 10/23/2012    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   Iron deficiency anemia    Pulmonary sarcoidosis (HCC)    Sleep apnea    uses CPAP   Thyroid disease    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)     Past Surgical History:  Procedure Laterality Date   ABDOMINAL HYSTERECTOMY N/A    [facility] 11/14/2020   APPENDECTOMY     c-section     x 2   CATARACT EXTRACTION     bilateral   CESAREAN SECTION N/A    [facility] 11/14/2020   COLONOSCOPY     EYE SURGERY N/A    [facility] 11/14/2020   SCLERAL BUCKLE     right   TONSILLECTOMY     TOTAL ABDOMINAL HYSTERECTOMY     TUBAL LIGATION     VITRECTOMY     bilateral    Social History   Socioeconomic History   Marital status: Married    Spouse name: Not on file   Number of children: 2   Years of education: Not on file   Highest education level: Not on file  Occupational History   Occupation: behavioral health  Tobacco Use   Smoking status: Former    Current packs/day: 0.00    Average packs/day: 0.2 packs/day for 1 year (0.2 ttl pk-yrs)    Types: Cigarettes    Start date: 12/12/1967    Quit date: 12/11/1968    Years since quitting: 54.9   Smokeless tobacco: Never  Vaping Use   Vaping status: Never Used  Substance and Sexual Activity   Alcohol use: No   Drug use: No   Sexual activity: Not on file  Other Topics Concern   Not on file  Social History Narrative   Not on file   Social Determinants of Health   Financial Resource Strain: Low Risk  (03/27/2023)   Overall Financial Resource  Strain (CARDIA)    Difficulty of Paying Living Expenses: Not very hard  Food Insecurity: No Food Insecurity (03/27/2023)   Hunger Vital Sign    Worried About Running Out of Food in the Last Year: Never true    Ran Out of Food in the Last Year: Never true  Transportation Needs: No Transportation Needs (03/27/2023)   PRAPARE - Administrator, Civil Service (Medical): No    Lack of Transportation (Non-Medical): No  Physical Activity: Inactive (03/27/2023)   Exercise Vital Sign    Days of Exercise per Week: 0 days    Minutes of Exercise  per Session: 0 min  Stress: No Stress Concern Present (03/27/2023)   [employer] of Occupational Health - Occupational Stress Questionnaire    Feeling of Stress : Only a little  Social Connections: Unknown (03/27/2023)   Social Connection and Isolation Panel [NHANES]    Frequency of Communication with Friends and Family: More than three times a week    Frequency of Social Gatherings with Friends and Family: Once a week    Attends Religious Services: Not on Marketing executive or Organizations: Yes    Attends Banker Meetings: More than 4 times per year    Marital Status: Married  Catering manager Violence: Not At Risk (03/27/2023)   Humiliation, Afraid, Rape, and Kick questionnaire    Fear of Current or Ex-Partner: No    Emotionally Abused: No    Physically Abused: No    Sexually Abused: No    Family History  Problem Relation Age of Onset   Colon polyps Mother        brothers x2   Clotting disorder Mother        brother x 2, MGM   Heart disease Brother        mother, MGM   Diabetes Brother        x 2, MGM   Stroke Maternal Grandmother    Colon cancer Neg Hx    Breast cancer Neg Hx    Esophageal cancer Neg Hx    Rectal cancer Neg Hx    Stomach cancer Neg Hx      Review of Systems  Constitutional: Negative.  Negative for chills and fever.  HENT: Negative.  Negative for congestion and sore throat.    Respiratory: Negative.  Negative for cough and shortness of breath.   Cardiovascular: Negative.  Negative for chest pain and palpitations.  Gastrointestinal:  Negative for abdominal pain, diarrhea, nausea and vomiting.  Genitourinary: Negative.  Negative for dysuria and hematuria.  Musculoskeletal:  Positive for back pain, joint pain and neck pain.  Skin: Negative.  Negative for rash.  Neurological: Negative.  Negative for dizziness and headaches.  All other systems reviewed and are negative.   Today's Vitals   11/06/23 0905  BP: (!) 134/90  Pulse: 81  Temp: 98.4 F (36.9 C)  TempSrc: Oral  SpO2: 94%  Weight: 213 lb (96.6 kg)  Height: 5' 2.5" (1.588 m)   Body mass index is 38.34 kg/m.   Physical Exam Vitals reviewed.  Constitutional:      Appearance: Normal appearance.  HENT:     Head: Normocephalic.  Eyes:     Extraocular Movements: Extraocular movements intact.  Cardiovascular:     Rate and Rhythm: Normal rate.  Pulmonary:     Effort: Pulmonary effort is normal.  Skin:    General: Skin is warm and dry.     Capillary Refill: Capillary refill takes less than 2 seconds.  Neurological:     General: No focal deficit present.     Mental Status: She is alert and oriented to person, place, and time.  Psychiatric:        Mood and Affect: Mood normal.        Behavior: Behavior normal.   DG Cervical Spine 2 or 3 views  Result Date: 11/06/2023 CLINICAL DATA:  Chronic neck pain EXAM: CERVICAL SPINE - 3 VIEW COMPARISON:  None Available. FINDINGS: Straightening of the normal cervical lordosis with trace retrolisthesis of C4 on C5 and C5 on C6. There  is disc space loss in the lower lumbar spine most notably at C5-C6 and C6-C7. Vertebral body heights are maintained. No acute cervical spine fracture. Visualized lung apices are clear. IMPRESSION: 1.  No acute osseous abnormality. 2. Lower cervical spine predominant degenerative changes with disc space loss most notably C5-C6 and  C6-C7 Electronically Signed   By: Lorenza Cambridge M.D.   On: 11/06/2023 09:39      ASSESSMENT & PLAN: A total of 32 minutes was spent with the patient and counseling/coordination of care regarding preparing for this visit, review of most recent office visit notes, review of multiple chronic medical conditions and their management, review of all medications, review of most recent bloodwork results, review of health maintenance items, education on nutrition, prognosis, documentation, and need for follow up. .  Problem List Items Addressed This Visit       Musculoskeletal and Integument   Primary osteoarthritis involving multiple joints    Contributing to present symptoms Meloxicam did not help much Pain management discussed Recommend orthopedic evaluation, possible rheumatology evaluation Ortho referral placed today.      Relevant Medications   traMADol (ULTRAM) 50 MG tablet   Other Relevant Orders   Ambulatory referral to Orthopedic Surgery   DG Cervical Spine 2 or 3 views     Other   Musculoskeletal pain - Primary    Pain management discussed Recommend Tylenol for mild to moderate pain and tramadol for moderate to severe pain      Relevant Medications   traMADol (ULTRAM) 50 MG tablet   Other Relevant Orders   Ambulatory referral to Orthopedic Surgery   DG Cervical Spine 2 or 3 views   Upper back pain    Musculoskeletal and mechanical in nature Chronic and affecting quality of life Cervical spine x-rays done today Pain management discussed Needs Ortho evaluation Referral placed today      Relevant Medications   traMADol (ULTRAM) 50 MG tablet   Other Relevant Orders   Ambulatory referral to Orthopedic Surgery   DG Cervical Spine 2 or 3 views   Patient Instructions  Musculoskeletal Pain Musculoskeletal pain refers to aches and pains in your bones, joints, muscles, and the tissues that surround them. This pain can occur in any part of the body. It can last for a short  time (acute) or a long time (chronic). A physical exam, lab tests, and imaging studies may be done to find the cause of your musculoskeletal pain. Follow these instructions at home: Lifestyle Try to control or lower your stress levels. Stress increases muscle tension and can worsen musculoskeletal pain. It is important to recognize when you are anxious or stressed and learn ways to manage it. This may include: Meditation or yoga. Cognitive or behavioral therapy. Acupuncture or massage therapy. You may continue all activities unless the activities cause more pain. When the pain gets better, slowly resume your normal activities. Gradually increase the intensity and duration of your activities or exercise. Managing pain, stiffness, and swelling     Treatment may include medicines for pain and inflammation that are taken by mouth or applied to the skin. Take over-the-counter and prescription medicines only as told by your health care provider. When your pain is severe, bed rest may be helpful. Lie or sit in any position that is comfortable, but get out of bed and walk around at least every couple of hours. If directed, apply heat to the affected area as often as told by your health care provider. Use the  heat source that your health care provider recommends, such as a moist heat pack or a heating pad. Place a towel between your skin and the heat source. Leave the heat on for 20-30 minutes. Remove the heat if your skin turns bright red. This is especially important if you are unable to feel pain, heat, or cold. You may have a greater risk of getting burned. If directed, put ice on the painful area. To do this: Put ice in a plastic bag. Place a towel between your skin and the bag. Leave the ice on for 20 minutes, 2-3 times a day. Remove the ice if your skin turns bright red. This is very important. If you cannot feel pain, heat, or cold, you have a greater risk of damage to the area. General  instructions Your health care provider may recommend that you see a physical therapist. This person can help you come up with a safe exercise program. If told by your health care provider, do physical therapy exercises to improve movement and strength in the affected area. Keep all follow-up visits. This is important. This includes any physical therapy visits. Contact a health care provider if: Your pain gets worse. Medicines do not help ease your pain. You cannot use the part of your body that hurts, such as your arm, leg, or neck. You have trouble sleeping. You have trouble doing your normal activities. Get help right away if: You have a new injury and your pain is worse or different. You feel numb or you have tingling in the painful area. Summary Musculoskeletal pain refers to aches and pains in your bones, joints, muscles, and the tissues that surround them. This pain can occur in any part of the body. Your health care provider may recommend that you see a physical therapist. This person can help you come up with a safe exercise program. Do any exercises as told by your physical therapist. Lower your stress level. Stress can worsen musculoskeletal pain. Ways to lower stress may include meditation, yoga, cognitive or behavioral therapy, acupuncture, and massage therapy. This information is not intended to replace advice given to you by your health care provider. Make sure you discuss any questions you have with your health care provider. Document Revised: 04/01/2020 Document Reviewed: 03/10/2020 Elsevier Patient Education  2024 Elsevier Inc.    Edwina Barth, MD [facility]

## 2023-11-06 NOTE — Assessment & Plan Note (Signed)
Musculoskeletal and mechanical in nature Chronic and affecting quality of life Cervical spine x-rays done today Pain management discussed Needs Ortho evaluation Referral placed today

## 2023-11-06 NOTE — Patient Instructions (Signed)

## 2023-11-06 NOTE — Assessment & Plan Note (Signed)
Contributing to present symptoms Meloxicam did not help much Pain management discussed Recommend orthopedic evaluation, possible rheumatology evaluation Ortho referral placed today.

## 2023-11-06 NOTE — Assessment & Plan Note (Signed)
Pain management discussed Recommend Tylenol for mild to moderate pain and tramadol for moderate to severe pain

## 2023-11-07 ENCOUNTER — Other Ambulatory Visit: Payer: Self-pay | Admitting: Emergency Medicine

## 2023-11-07 DIAGNOSIS — K219 Gastro-esophageal reflux disease without esophagitis: Secondary | ICD-10-CM

## 2023-11-12 ENCOUNTER — Encounter: Payer: Self-pay | Admitting: Physical Medicine and Rehabilitation

## 2023-11-12 ENCOUNTER — Ambulatory Visit: Payer: Medicare HMO | Admitting: Physical Medicine and Rehabilitation

## 2023-11-12 DIAGNOSIS — M7918 Myalgia, other site: Secondary | ICD-10-CM | POA: Diagnosis not present

## 2023-11-12 DIAGNOSIS — R202 Paresthesia of skin: Secondary | ICD-10-CM | POA: Diagnosis not present

## 2023-11-12 DIAGNOSIS — M542 Cervicalgia: Secondary | ICD-10-CM

## 2023-11-12 MED ORDER — BACLOFEN 10 MG PO TABS: 10.0000 mg | ORAL_TABLET | Freq: Three times a day (TID) | ORAL | 0 refills | Status: DC

## 2023-11-12 MED ORDER — MELOXICAM 15 MG PO TABS: 15.0000 mg | ORAL_TABLET | Freq: Every day | ORAL | 0 refills | Status: DC
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
## 2023-11-20 ENCOUNTER — Other Ambulatory Visit: Payer: Self-pay | Admitting: Emergency Medicine

## 2023-11-20 DIAGNOSIS — Z1231 Encounter for screening mammogram for malignant neoplasm of breast: Secondary | ICD-10-CM

## 2023-11-20 NOTE — Therapy (Signed)
OUTPATIENT PHYSICAL THERAPY EVALUATION   Patient Name: Eileen Jefferson MRN: [id number] DOB:[birth year], 72 y.o., female Today's Date: 11/21/2023  END OF SESSION:  PT End of Session - 11/21/23 1337     Visit Number 1    Number of Visits 24    Date for PT Re-Evaluation 02/13/24    Authorization Type [employer] $25    Authorization - Number of Visits 12    Progress Note Due on Visit 10    PT Start Time 1342    PT Stop Time 1415    PT Time Calculation (min) 33 min    Activity Tolerance Patient tolerated treatment well    Behavior During Therapy WFL for tasks assessed/performed             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   Iron deficiency anemia    Pulmonary sarcoidosis (HCC)    Sleep apnea    uses CPAP   Thyroid disease    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)    Past Surgical History:  Procedure Laterality Date   ABDOMINAL HYSTERECTOMY N/A    [facility] 11/14/2020   APPENDECTOMY     c-section     x 2   CATARACT EXTRACTION     bilateral   CESAREAN SECTION N/A    [facility] 11/14/2020   COLONOSCOPY     EYE SURGERY N/A    [facility] 11/14/2020   SCLERAL BUCKLE     right   TONSILLECTOMY     TOTAL ABDOMINAL HYSTERECTOMY     TUBAL LIGATION     VITRECTOMY     bilateral   Patient Active Problem List   Diagnosis Date Noted   Primary osteoarthritis involving multiple joints 11/06/2023   Upper back pain 11/06/2023   Cervical radiculopathy 10/16/2023   Diabetes (HCC) 09/25/2021   Achilles tendonitis 09/23/2021   Stage 3a chronic kidney disease (HCC) 11/08/2020   Retinal telangiectasia of  right eye 05/18/2020   Retinal telangiectasia of left eye 05/18/2020   Cystoid macular edema of left eye 05/18/2020   Hyperuricemia 04/27/2020   Musculoskeletal pain 12/23/2018   Gastroesophageal reflux disease 11/23/2018   DDD (degenerative disc disease), cervical 07/11/2017   Localized osteoarthrosis of right shoulder region 07/11/2017   Anemia 07/11/2017   Hyperlipidemia 06/28/2016   Thyroid disease 06/28/2016   Diabetic retinopathy (HCC) 05/05/2015   Class 2 severe obesity due to excess calories with serious comorbidity and body mass index (BMI) of 38.0 to 38.9 in adult [facility]) 01/11/2015   Glaucoma 04/06/2014   Macular degeneration 04/06/2014   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, 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   Hypertension associated with diabetes (HCC) 10/02/2013   Sarcoidosis 10/02/2013   Dyslipidemia associated with type 2 diabetes mellitus (HCC) 10/02/2013   OSA (obstructive sleep apnea) 10/23/2012    PCP: Georgina Quint MD  REFERRING PROVIDER: Juanda Chance, NP  REFERRING DIAG: M54.2 (ICD-10-CM) - Cervicalgia M79.18 (ICD-10-CM) - Myofascial pain syndrome R20.2 (ICD-10-CM) - Paresthesia of skin  THERAPY DIAG:  Cervicalgia  Left arm pain  Abnormal posture  Rationale for Evaluation and Treatment: Rehabilitation  ONSET DATE: 09/2023  SUBJECTIVE:  SUBJECTIVE STATEMENT: Complaints of neck pain into Lt arm.  Pt indicated base of neck pain.  Pt indicated some tingling in middle 3 fingers on tips.  Pt indicated mostly there all the time.  Insidious onset of symptoms.  Reported housecleaning with vacuum was troublesome.  Pt indicated having some complaints in sleeping due to symptoms.   PERTINENT HISTORY:  DDD cervical, GERD, HTN, hyperlipidemia, DM  PAIN:  NPRS scale: at  worst 8-9/10 Pain location: cervical pain, Lt arm symptoms  Pain description: constant, dull achy pain Aggravating factors: househwork, sleeping,  Relieving factors: extra strength tylenol, heat.   PRECAUTIONS: None  WEIGHT BEARING RESTRICTIONS: No  FALLS:  Has patient fallen in last 6 months? No  LIVING ENVIRONMENT: Lives in: House/apartment  OCCUPATION: Retired  PLOF: Independent, Rt hand dominant. Housework, reading.    PATIENT GOALS: Reduce pain.   OBJECTIVE:   PATIENT SURVEYS:  11/21/2023 FOTO intake:  45  predicted:  55  COGNITION: 11/21/2023 Overall cognitive status: Within functional limits for tasks assessed  SENSATION: 11/21/2023 WFL  POSTURE:  11/21/2023 rounded shoulders and forward head  PALPATION: 11/21/2023 Trigger points, tightness in Lt upper trap, Lt cervical paraspinals, Lt infraspinatus.  Possible connection to Lt arm symptoms observed from Lt infraspinatus.    CERVICAL ROM:   ROM AROM (deg) 11/21/2023  Flexion 52 with no symptoms change  Extension 35 c neck pain with no arm symptoms  Right lateral flexion   Left lateral flexion   Right rotation 65 c no changes  Left rotation 74 c no changes   (Blank rows = not tested)  UPPER EXTREMITY ROM:   ROM Right 11/21/2023 Left 11/21/2023  Shoulder flexion    Shoulder extension    Shoulder abduction    Shoulder adduction    Shoulder extension    Shoulder internal rotation    Shoulder external rotation    Elbow flexion    Elbow extension    Wrist flexion    Wrist extension    Wrist ulnar deviation    Wrist radial deviation    Wrist pronation    Wrist supination     (Blank rows = not tested)  UPPER EXTREMITY MMT:  MMT Right 11/21/2023 Left 11/21/2023  Shoulder flexion 5/5 5/5  Shoulder extension    Shoulder abduction 5/5 5/5  Shoulder adduction    Shoulder extension    Shoulder internal rotation 5/5 5/5  Shoulder external rotation 5/5 5/5  Middle trapezius    Lower  trapezius    Elbow flexion 5/5 5/5  Elbow extension 5/5 5/5  Wrist flexion    Wrist extension    Wrist ulnar deviation    Wrist radial deviation    Wrist pronation    Wrist supination    Grip strength     (Blank rows = not tested)  CERVICAL SPECIAL TESTS:  11/21/2023 (-) spurlings compression  FUNCTIONAL TESTS:  11/21/2023 No specific testing.  TODAY'S TREATMENT:                                                                                                       DATE: 11/21/2023 Therex:    HEP instruction/performance c cues for techniques, handout provided.  Trial set performed of each for comprehension and symptom assessment.  See below for exercise list  Manual Percussive device Lt upper trap, Lt infraspinatus   PATIENT EDUCATION:  Education details: HEP, POC Person educated: Patient Education method: Explanation, Demonstration, Verbal cues, and Handouts Education comprehension: verbalized understanding, returned demonstration, and verbal cues required  HOME EXERCISE PROGRAM: Access Code: [email or web address] URL: [email or web address] Date: 11/21/2023 Prepared by: Chyrel Masson  Exercises - Seated Upper Trapezius Stretch (Mirrored)  - 2-3 x daily - 7 x weekly - 1 sets - 3-5 reps - 15 hold - Standing Shoulder Posterior Capsule Stretch  - 2-3 x daily - 7 x weekly - 1 sets - 3-5 reps - 15 hold - Seated Scapular Retraction  - 3-5 x daily - 7 x weekly - 1 sets - 5-10 reps - 3-5 hold - Cervical Retraction at Wall  - 2-3 x daily - 7 x weekly - 1 sets - 5-10 reps - 5 hold - Standing Isometric Shoulder External Rotation with Doorway  - 2-3 x daily - 7 x weekly - 1 sets - 5-10 reps - 5 hold  ASSESSMENT:  CLINICAL IMPRESSION: Patient is a 72 y.o. who comes to clinic with complaints of cervical  pain with mobility, strength and movement coordination deficits that impair their ability to perform usual daily and recreational functional activities without increase difficulty/symptoms at this time.  Patient to benefit from skilled PT services to address impairments and limitations to improve to previous level of function without restriction secondary to condition.    OBJECTIVE IMPAIRMENTS: decreased activity tolerance, decreased coordination, decreased endurance, decreased mobility, decreased ROM, increased fascial restrictions, impaired perceived functional ability, increased muscle spasms, impaired flexibility, impaired UE functional use, improper body mechanics, postural dysfunction, and pain.   ACTIVITY LIMITATIONS: carrying, lifting, sitting, sleeping, and locomotion level  PARTICIPATION LIMITATIONS: meal prep, cleaning, laundry, interpersonal relationship, driving, shopping, and community activity  PERSONAL FACTORS:  DDD cervical, GERD, HTN, hyperlipidemia, DM  are also affecting patient's functional outcome.   REHAB POTENTIAL: Good  CLINICAL DECISION MAKING: Stable/uncomplicated  EVALUATION COMPLEXITY: Low   GOALS: Goals reviewed with patient? Yes  SHORT TERM GOALS: (target date for Short term goals are 3 weeks 12/12/2023)  1.Patient will demonstrate independent use of home exercise program to maintain progress from in clinic treatments. Goal status: New  LONG TERM GOALS: (target dates for all long term goals are 12 weeks  02/13/2024 )   1. Patient will demonstrate/report pain at worst less than or equal to 2/10 to facilitate minimal limitation in daily activity secondary to pain symptoms. Goal status: New   2. Patient will demonstrate independent use of home exercise program to facilitate ability to maintain/progress functional gains from skilled physical therapy services. Goal status: New   3. Patient will demonstrate FOTO outcome > or =  55 % to indicate reduced disability  due to condition. Goal status: New   4.  Patient will demonstrate cervical AROM WFL s symptoms to facilitate usual head movements for daily activity including driving, self care.   Goal status: New   5.  Patient will demonstrate/report ability to sleep s restriction due to symptoms.   Goal status: New    PLAN:  PT FREQUENCY: 1-2x/week  PT DURATION: 12 weeks  Can include 40981- PT Re-evaluation, 97110-Therapeutic exercises, 97530- Therapeutic activity, 97112- Neuromuscular re-education, 97535- Self Care, 97140- Manual therapy, [phone]- Aquatic Therapy, 97014- Electrical stimulation (unattended), [phone]- Traction (mechanical)  Patient/Family education, Balance training, Stair training, Taping, Dry Needling, Joint mobilization, Joint manipulation, Spinal manipulation, Spinal mobilization, Scar mobilization, Vestibular training, Visual/preceptual remediation/compensation, DME instructions, Cryotherapy, and Moist heat.  All performed as medically necessary.  All included unless contraindicated  PLAN FOR NEXT SESSION: Check HEP use/response.  Possible percussive device.  May benefit from needling (haven't discussed with patient yet).     Chyrel Masson, PT, DPT, OCS, ATC 11/21/23  2:18 PM   Referring diagnosis? M54.2 (ICD-10-CM) - Cervicalgia M79.18 (ICD-10-CM) - Myofascial pain syndrome R20.2 (ICD-10-CM) - Paresthesia of skin Treatment diagnosis? (if different than referring diagnosis) M54.2 (ICD-10-CM) - Cervicalgia What was this (referring dx) caused by? []  Surgery []  Fall [x]  Ongoing issue []  Arthritis []  Other: ____________  Laterality: []  Rt []  Lt [x]  Both  Check all possible CPT codes:  *CHOOSE 10 OR LESS*    See Planned Interventions listed in the Plan section of the Evaluation.

## 2023-11-21 ENCOUNTER — Ambulatory Visit: Payer: Medicare HMO | Admitting: Rehabilitative and Restorative Service Providers"

## 2023-11-21 ENCOUNTER — Encounter: Payer: Self-pay | Admitting: Rehabilitative and Restorative Service Providers"

## 2023-11-21 DIAGNOSIS — M542 Cervicalgia: Secondary | ICD-10-CM | POA: Diagnosis not present

## 2023-11-21 DIAGNOSIS — M79602 Pain in left arm: Secondary | ICD-10-CM | POA: Diagnosis not present

## 2023-11-21 DIAGNOSIS — R293 Abnormal posture: Secondary | ICD-10-CM

## 2023-12-08 ENCOUNTER — Other Ambulatory Visit: Payer: Self-pay | Admitting: Emergency Medicine

## 2023-12-08 DIAGNOSIS — E1169 Type 2 diabetes mellitus with other specified complication: Secondary | ICD-10-CM

## 2023-12-09 ENCOUNTER — Other Ambulatory Visit: Payer: Self-pay | Admitting: Physical Medicine and Rehabilitation

## 2023-12-10 DIAGNOSIS — H35351 Cystoid macular degeneration, right eye: Secondary | ICD-10-CM | POA: Diagnosis not present

## 2023-12-10 DIAGNOSIS — H35073 Retinal telangiectasis, bilateral: Secondary | ICD-10-CM | POA: Diagnosis not present

## 2023-12-10 DIAGNOSIS — H401131 Primary open-angle glaucoma, bilateral, mild stage: Secondary | ICD-10-CM | POA: Diagnosis not present

## 2023-12-10 DIAGNOSIS — H3523 Other non-diabetic proliferative retinopathy, bilateral: Secondary | ICD-10-CM | POA: Diagnosis not present

## 2023-12-10 DIAGNOSIS — E113511 Type 2 diabetes mellitus with proliferative diabetic retinopathy with macular edema, right eye: Secondary | ICD-10-CM | POA: Diagnosis not present

## 2023-12-11 ENCOUNTER — Encounter: Payer: Medicare HMO | Admitting: Rehabilitative and Restorative Service Providers"

## 2023-12-13 ENCOUNTER — Ambulatory Visit: Payer: Medicare HMO | Admitting: Rehabilitative and Restorative Service Providers"

## 2023-12-13 ENCOUNTER — Encounter: Payer: Self-pay | Admitting: Rehabilitative and Restorative Service Providers"

## 2023-12-13 DIAGNOSIS — R293 Abnormal posture: Secondary | ICD-10-CM | POA: Diagnosis not present

## 2023-12-13 DIAGNOSIS — M542 Cervicalgia: Secondary | ICD-10-CM

## 2023-12-13 DIAGNOSIS — M79602 Pain in left arm: Secondary | ICD-10-CM

## 2023-12-13 NOTE — Therapy (Signed)
 OUTPATIENT PHYSICAL THERAPY TREATMENT   Patient Name: Eileen Jefferson MRN: [id number] DOB:02-15-1951, 73 y.o., female Today's Date: 12/13/2023  END OF SESSION:  PT End of Session - 12/13/23 0914     Visit Number 2    Number of Visits 24    Date for PT Re-Evaluation 02/13/24    Authorization Type [employer] $25    Authorization Time Period -02/13/2024    Authorization - Visit Number 2    Authorization - Number of Visits 12    Progress Note Due on Visit 10    PT Start Time 0919    PT Stop Time 0957    PT Time Calculation (min) 38 min    Activity Tolerance Patient tolerated treatment well    Behavior During Therapy WFL for tasks assessed/performed              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   Iron deficiency anemia    Pulmonary sarcoidosis (HCC)    Sleep apnea    uses CPAP   Thyroid  disease    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)    Past Surgical History:  Procedure Laterality Date   ABDOMINAL HYSTERECTOMY N/A    [facility] 11/14/2020   APPENDECTOMY     c-section     x 2   CATARACT EXTRACTION     bilateral   CESAREAN SECTION N/A    [facility] 11/14/2020   COLONOSCOPY     EYE SURGERY N/A    [facility] 11/14/2020   SCLERAL BUCKLE     right   TONSILLECTOMY     TOTAL ABDOMINAL HYSTERECTOMY     TUBAL LIGATION     VITRECTOMY     bilateral   Patient Active Problem List   Diagnosis Date Noted   Primary osteoarthritis involving multiple joints 11/06/2023   Upper back pain 11/06/2023   Cervical radiculopathy 10/16/2023   Diabetes (HCC) 09/25/2021   Achilles tendonitis 09/23/2021   Stage  3a chronic kidney disease (HCC) 11/08/2020   Retinal telangiectasia of right eye 05/18/2020   Retinal telangiectasia of left eye 05/18/2020   Cystoid macular edema of left eye 05/18/2020   Hyperuricemia 04/27/2020   Musculoskeletal pain 12/23/2018   Gastroesophageal reflux disease 11/23/2018   DDD (degenerative disc disease), cervical 07/11/2017   Localized osteoarthrosis of right shoulder region 07/11/2017   Anemia 07/11/2017   Hyperlipidemia 06/28/2016   Thyroid  disease 06/28/2016   Diabetic retinopathy (HCC) 05/05/2015   Class 2 severe obesity due to excess calories with serious comorbidity and body mass index (BMI) of 38.0 to 38.9 in adult [facility]) 01/11/2015   Glaucoma 04/06/2014   Macular degeneration 04/06/2014   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, 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   Hypertension associated with diabetes (HCC) 10/02/2013   Sarcoidosis 10/02/2013   Dyslipidemia associated with type 2 diabetes mellitus (HCC) 10/02/2013   OSA (obstructive sleep apnea) 10/23/2012    PCP: Purcell Emil Schanz MD  REFERRING PROVIDER: Trudy Duwaine BRAVO, NP  REFERRING DIAG: M54.2 (ICD-10-CM) - Cervicalgia M79.18 (ICD-10-CM) - Myofascial pain syndrome R20.2 (ICD-10-CM) - Paresthesia of skin  THERAPY DIAG:  Cervicalgia  Left arm pain  Abnormal posture  Rationale for Evaluation and Treatment: Rehabilitation  ONSET DATE: 09/2023  SUBJECTIVE:                                                                                                                                                                                                         SUBJECTIVE STATEMENT: Pt indicated having maybe some mild improvement.   PERTINENT HISTORY:  DDD cervical, GERD, HTN, hyperlipidemia, DM  PAIN:  NPRS scale: current 3/10.   Pain location: cervical pain, Lt arm symptoms  Pain description: constant, dull achy pain Aggravating factors: househwork, sleeping,  Relieving factors: extra strength tylenol,  heat.   PRECAUTIONS: None  WEIGHT BEARING RESTRICTIONS: No  FALLS:  Has patient fallen in last 6 months? No  LIVING ENVIRONMENT: Lives in: House/apartment  OCCUPATION: Retired  PLOF: Independent, Rt hand dominant. Housework, reading.    PATIENT GOALS: Reduce pain.   OBJECTIVE:   PATIENT SURVEYS:  11/21/2023 FOTO intake:  45  predicted:  55  COGNITION: 11/21/2023 Overall cognitive status: Within functional limits for tasks assessed  SENSATION: 11/21/2023 WFL  POSTURE:  11/21/2023 rounded shoulders and forward head  PALPATION: 11/21/2023 Trigger points, tightness in Lt upper trap, Lt cervical paraspinals, Lt infraspinatus.  Possible connection to Lt arm symptoms observed from Lt infraspinatus.    CERVICAL ROM:   ROM AROM (deg) 11/21/2023 AROM 12/13/2023  Flexion 52 with no symptoms change   Extension 35 c neck pain with no arm symptoms   Right lateral flexion    Left lateral flexion    Right rotation 65 c no changes   Left rotation 74 c no changes    (Blank rows = not tested)  UPPER EXTREMITY ROM:   ROM Right 11/21/2023 Left 11/21/2023  Shoulder flexion    Shoulder extension    Shoulder abduction    Shoulder adduction    Shoulder extension    Shoulder internal rotation    Shoulder external rotation    Elbow flexion    Elbow extension    Wrist flexion    Wrist extension    Wrist ulnar deviation    Wrist radial deviation    Wrist pronation    Wrist supination     (Blank rows = not tested)  UPPER EXTREMITY MMT:  MMT Right 11/21/2023 Left 11/21/2023  Shoulder flexion 5/5 5/5  Shoulder extension    Shoulder abduction 5/5 5/5  Shoulder adduction    Shoulder extension    Shoulder internal rotation 5/5 5/5  Shoulder external rotation 5/5 5/5  Middle trapezius    Lower  trapezius    Elbow flexion 5/5 5/5  Elbow extension 5/5 5/5  Wrist flexion    Wrist extension    Wrist ulnar deviation    Wrist radial deviation    Wrist pronation     Wrist supination    Grip strength     (Blank rows = not tested)  CERVICAL SPECIAL TESTS:  11/21/2023 (-) spurlings compression  FUNCTIONAL TESTS:  11/21/2023 No specific testing.                                                                                                                                                                                  TODAY'S TREATMENT:                                                                                                       DATE: 12/13/2023 Therex: UBE fwd/back 4 mins each way with 1 min rest break lvl 2.5  Tband rows green 2 x 15 Tband gh ext 2 x 15 Cross arm stretch 15 sec x 3 Lt arm    Manual Percussive device Lt upper trap, Lt infraspinatus   Trigger Point Dry Needling Initial Treatment: Pt instructed on Dry Needling rational, procedures, and possible side effects. Pt instructed to expect mild to moderate muscle soreness later in the day and/or into the next day.  Pt instructed in methods to reduce muscle soreness. Pt instructed to continue prescribed HEP. Because Dry Needling was performed over or adjacent to a lung field, pt was educated on S/S of pneumothorax and to seek immediate medical attention should they occur.  Patient was educated on signs and symptoms of infection and other risk factors and advised to seek medical attention should they occur.  Patient verbalized understanding of these instructions and education.  Patient Verbal Consent Given: Yes Education Handout Provided: Yes Muscles treated: Lt infraspinatus Treatment response/outcome: local twitch response with concordant symptoms in Lt arm.      TODAY'S TREATMENT:  DATE: 11/21/2023 Therex:    HEP instruction/performance c cues for techniques, handout provided.  Trial set performed of each for comprehension and symptom assessment.  See below for exercise  list  Manual Percussive device Lt upper trap, Lt infraspinatus   PATIENT EDUCATION:  Education details: HEP, POC Person educated: Patient Education method: Explanation, Demonstration, Verbal cues, and Handouts Education comprehension: verbalized understanding, returned demonstration, and verbal cues required  HOME EXERCISE PROGRAM: Access Code: [email or web address] URL: [email or web address] Date: 11/21/2023 Prepared by: Ozell Silvan  Exercises - Seated Upper Trapezius Stretch (Mirrored)  - 2-3 x daily - 7 x weekly - 1 sets - 3-5 reps - 15 hold - Standing Shoulder Posterior Capsule Stretch  - 2-3 x daily - 7 x weekly - 1 sets - 3-5 reps - 15 hold - Seated Scapular Retraction  - 3-5 x daily - 7 x weekly - 1 sets - 5-10 reps - 3-5 hold - Cervical Retraction at Wall  - 2-3 x daily - 7 x weekly - 1 sets - 5-10 reps - 5 hold - Standing Isometric Shoulder External Rotation with Doorway  - 2-3 x daily - 7 x weekly - 1 sets - 5-10 reps - 5 hold  ASSESSMENT:  CLINICAL IMPRESSION: Good overall tolerance to manual and dry needling today.  Lt arm symptoms were noted in twitch response in Lt infraspinatus.  Continued skilled PT services indicated at this time to progress towards LTGs.  Needling follow up and performance as desired.   OBJECTIVE IMPAIRMENTS: decreased activity tolerance, decreased coordination, decreased endurance, decreased mobility, decreased ROM, increased fascial restrictions, impaired perceived functional ability, increased muscle spasms, impaired flexibility, impaired UE functional use, improper body mechanics, postural dysfunction, and pain.   ACTIVITY LIMITATIONS: carrying, lifting, sitting, sleeping, and locomotion level  PARTICIPATION LIMITATIONS: meal prep, cleaning, laundry, interpersonal relationship, driving, shopping, and community activity  PERSONAL FACTORS:  DDD cervical, GERD, HTN, hyperlipidemia, DM  are also affecting patient's functional outcome.    REHAB POTENTIAL: Good  CLINICAL DECISION MAKING: Stable/uncomplicated  EVALUATION COMPLEXITY: Low   GOALS: Goals reviewed with patient? Yes  SHORT TERM GOALS: (target date for Short term goals are 3 weeks 12/12/2023)  1.Patient will demonstrate independent use of home exercise program to maintain progress from in clinic treatments. Goal status: on going 12/13/2023  LONG TERM GOALS: (target dates for all long term goals are 12 weeks  02/13/2024 )   1. Patient will demonstrate/report pain at worst less than or equal to 2/10 to facilitate minimal limitation in daily activity secondary to pain symptoms. Goal status: New   2. Patient will demonstrate independent use of home exercise program to facilitate ability to maintain/progress functional gains from skilled physical therapy services. Goal status: New   3. Patient will demonstrate FOTO outcome > or = 55 % to indicate reduced disability due to condition. Goal status: New   4.  Patient will demonstrate cervical AROM WFL s symptoms to facilitate usual head movements for daily activity including driving, self care.   Goal status: New   5.  Patient will demonstrate/report ability to sleep s restriction due to symptoms.   Goal status: New    PLAN:  PT FREQUENCY: 1-2x/week  PT DURATION: 12 weeks  Can include 02853- PT Re-evaluation, 97110-Therapeutic exercises, 97530- Therapeutic activity, 97112- Neuromuscular re-education, 97535- Self Care, 97140- Manual therapy, [phone]- Aquatic Therapy, 97014- Electrical stimulation (unattended), [phone]- Traction (mechanical)  Patient/Family education, Balance training, Stair training, Taping, Dry Needling, Joint mobilization, Joint manipulation, Spinal manipulation, Spinal mobilization, Scar  mobilization, Vestibular training, Visual/preceptual remediation/compensation, DME instructions, Cryotherapy, and Moist heat.  All performed as medically necessary.  All included unless contraindicated  PLAN  FOR NEXT SESSION: Dry needling as desired (include upper trap).  Check Lt arm symptoms response.     Ozell Silvan, PT, DPT, OCS, ATC 12/13/23  9:56 AM     Referring diagnosis? M54.2 (ICD-10-CM) - Cervicalgia M79.18 (ICD-10-CM) - Myofascial pain syndrome R20.2 (ICD-10-CM) - Paresthesia of skin Treatment diagnosis? (if different than referring diagnosis) M54.2 (ICD-10-CM) - Cervicalgia What was this (referring dx) caused by? []  Surgery []  Fall [x]  Ongoing issue []  Arthritis []  Other: ____________  Laterality: []  Rt []  Lt [x]  Both  Check all possible CPT codes:  *CHOOSE 10 OR LESS*    See Planned Interventions listed in the Plan section of the Evaluation.

## 2023-12-13 NOTE — Patient Instructions (Signed)

## 2023-12-14 ENCOUNTER — Ambulatory Visit
Admission: RE | Admit: 2023-12-14 | Discharge: 2023-12-14 | Disposition: A | Discharge status: Home / Self Care | Payer: Medicare HMO | Source: Ambulatory Visit | Attending: Emergency Medicine

## 2023-12-14 DIAGNOSIS — Z1231 Encounter for screening mammogram for malignant neoplasm of breast: Secondary | ICD-10-CM | POA: Diagnosis not present

## 2023-12-18 ENCOUNTER — Encounter: Payer: Self-pay | Admitting: Rehabilitative and Restorative Service Providers"

## 2023-12-18 ENCOUNTER — Ambulatory Visit: Payer: Medicare HMO | Admitting: Rehabilitative and Restorative Service Providers"

## 2023-12-18 DIAGNOSIS — M542 Cervicalgia: Secondary | ICD-10-CM

## 2023-12-18 DIAGNOSIS — R293 Abnormal posture: Secondary | ICD-10-CM

## 2023-12-18 DIAGNOSIS — M79602 Pain in left arm: Secondary | ICD-10-CM

## 2023-12-18 NOTE — Therapy (Signed)
 OUTPATIENT PHYSICAL THERAPY TREATMENT   Patient Name: Eileen Jefferson MRN: [id number] DOB:08/05/1951, 73 y.o., female Today's Date: 12/18/2023  END OF SESSION:  PT End of Session - 12/18/23 1317     Visit Number 3    Number of Visits 24    Date for PT Re-Evaluation 02/13/24    Authorization Type [employer] $25    Authorization Time Period -02/13/2024    Authorization - Visit Number 3    Authorization - Number of Visits 12    Progress Note Due on Visit 10    PT Start Time 1307    PT Stop Time 1344    PT Time Calculation (min) 37 min    Activity Tolerance Patient tolerated treatment well    Behavior During Therapy WFL for tasks assessed/performed               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   Iron deficiency anemia    Pulmonary sarcoidosis (HCC)    Sleep apnea    uses CPAP   Thyroid  disease    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)    Past Surgical History:  Procedure Laterality Date   ABDOMINAL HYSTERECTOMY N/A    [employer] 11/14/2020   APPENDECTOMY     c-section     x 2   CATARACT EXTRACTION     bilateral   CESAREAN SECTION N/A    [employer] 11/14/2020   COLONOSCOPY     EYE SURGERY N/A    [employer] 11/14/2020   SCLERAL BUCKLE     right   TONSILLECTOMY     TOTAL ABDOMINAL HYSTERECTOMY     TUBAL LIGATION     VITRECTOMY     bilateral   Patient Active Problem List   Diagnosis Date Noted   Primary osteoarthritis involving multiple joints 11/06/2023   Upper back pain 11/06/2023   Cervical radiculopathy 10/16/2023   Diabetes (HCC) 09/25/2021   Achilles tendonitis 09/23/2021   Stage  3a chronic kidney disease (HCC) 11/08/2020   Retinal telangiectasia of right eye 05/18/2020   Retinal telangiectasia of left eye 05/18/2020   Cystoid macular edema of left eye 05/18/2020   Hyperuricemia 04/27/2020   Musculoskeletal pain 12/23/2018   Gastroesophageal reflux disease 11/23/2018   DDD (degenerative disc disease), cervical 07/11/2017   Localized osteoarthrosis of right shoulder region 07/11/2017   Anemia 07/11/2017   Hyperlipidemia 06/28/2016   Thyroid  disease 06/28/2016   Diabetic retinopathy (HCC) 05/05/2015   Class 2 severe obesity due to excess calories with serious comorbidity and body mass index (BMI) of 38.0 to 38.9 in adult [facility]) 01/11/2015   Glaucoma 04/06/2014   Macular degeneration 04/06/2014   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, 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   Hypertension associated with diabetes (HCC) 10/02/2013   Sarcoidosis 10/02/2013   Dyslipidemia associated with type 2 diabetes mellitus (HCC) 10/02/2013   OSA (obstructive sleep apnea) 10/23/2012    PCP: Purcell Emil Schanz MD  REFERRING PROVIDER: Trudy Duwaine BRAVO, NP  REFERRING DIAG: M54.2 (ICD-10-CM) - Cervicalgia M79.18 (ICD-10-CM) - Myofascial pain syndrome R20.2 (ICD-10-CM) - Paresthesia of skin  THERAPY DIAG:  Cervicalgia  Left arm pain  Abnormal posture  Rationale for Evaluation and Treatment: Rehabilitation  ONSET DATE: 09/2023  SUBJECTIVE:                                                                                                                                                                                                         SUBJECTIVE STATEMENT: Pt indicated feeling improvement since last visit.   PERTINENT HISTORY:  DDD cervical, GERD, HTN, hyperlipidemia, DM  PAIN:  NPRS scale: current 3-4/10 Pain location: cervical pain, Lt arm symptoms  Pain description: constant, dull achy pain Aggravating factors: househwork, sleeping,  Relieving factors: extra strength tylenol,  heat.   PRECAUTIONS: None  WEIGHT BEARING RESTRICTIONS: No  FALLS:  Has patient fallen in last 6 months? No  LIVING ENVIRONMENT: Lives in: House/apartment  OCCUPATION: Retired  PLOF: Independent, Rt hand dominant. Housework, reading.    PATIENT GOALS: Reduce pain.   OBJECTIVE:   PATIENT SURVEYS:  11/21/2023 FOTO intake:  45  predicted:  55  COGNITION: 11/21/2023 Overall cognitive status: Within functional limits for tasks assessed  SENSATION: 11/21/2023 WFL  POSTURE:  11/21/2023 rounded shoulders and forward head  PALPATION: 11/21/2023 Trigger points, tightness in Lt upper trap, Lt cervical paraspinals, Lt infraspinatus.  Possible connection to Lt arm symptoms observed from Lt infraspinatus.    CERVICAL ROM:   ROM AROM (deg) 11/21/2023 AROM 12/17/2022  Flexion 52 with no symptoms change 50  Extension 35 c neck pain with no arm symptoms 50  Right lateral flexion    Left lateral flexion    Right rotation 65 c no changes 65  Left rotation 74 c no changes 65   (Blank rows = not tested)  UPPER EXTREMITY ROM:   ROM Right 11/21/2023 Left 11/21/2023  Shoulder flexion    Shoulder extension    Shoulder abduction    Shoulder adduction    Shoulder extension    Shoulder internal rotation    Shoulder external rotation    Elbow flexion    Elbow extension    Wrist flexion    Wrist extension    Wrist ulnar deviation    Wrist radial deviation    Wrist pronation    Wrist supination     (Blank rows = not tested)  UPPER EXTREMITY MMT:  MMT Right 11/21/2023 Left 11/21/2023  Shoulder flexion 5/5 5/5  Shoulder extension    Shoulder abduction 5/5 5/5  Shoulder adduction    Shoulder extension    Shoulder internal rotation 5/5 5/5  Shoulder external rotation 5/5 5/5  Middle trapezius    Lower trapezius  Elbow flexion 5/5 5/5  Elbow extension 5/5 5/5  Wrist flexion    Wrist extension    Wrist ulnar deviation    Wrist radial deviation    Wrist  pronation    Wrist supination    Grip strength     (Blank rows = not tested)  CERVICAL SPECIAL TESTS:  12/18/2023: Cervical distraction positive  11/21/2023 (-) spurlings compression  FUNCTIONAL TESTS:  11/21/2023 No specific testing.                                                                                                                                                                                  TODAY'S TREATMENT:                                                                                                       DATE: 12/18/2023 Manual Supine cervical distraction intermittent with manual pull. Skilled palpation with dry needling.   Trigger Point Dry Needling Subsequent Treatment: Instructions provided previously at initial dry needling treatment.  Patient Verbal Consent Given: Yes Education Handout Provided: Yes Muscles treated: Lt upper trap  Treatment response/outcome: local twitch response  Therex: Supine cervical retraction 5 sec hold x 10  Upper trap Lt self stretch 15 sec x 3  Tband rows bilateral 2 x 15 green   Tband GH ext bilateral 2 x 15 greenn  Mechanical Traction Supine cervical traction 10 mins with intermittent 15/10 lbs 60sec/20secs   TODAY'S TREATMENT:                                                                                                       DATE: 12/13/2023 Therex: UBE fwd/back 4 mins each way with 1 min rest break lvl 2.5  Tband rows green 2 x 15 Tband gh ext 2 x 15 Cross arm stretch 15 sec  x 3 Lt arm    Manual Percussive device Lt upper trap, Lt infraspinatus   Trigger Point Dry Needling Initial Treatment: Pt instructed on Dry Needling rational, procedures, and possible side effects. Pt instructed to expect mild to moderate muscle soreness later in the day and/or into the next day.  Pt instructed in methods to reduce muscle soreness. Pt instructed to continue prescribed HEP. Because Dry Needling was performed over or  adjacent to a lung field, pt was educated on S/S of pneumothorax and to seek immediate medical attention should they occur.  Patient was educated on signs and symptoms of infection and other risk factors and advised to seek medical attention should they occur.  Patient verbalized understanding of these instructions and education.  Patient Verbal Consent Given: Yes Education Handout Provided: Yes Muscles treated: Lt infraspinatus Treatment response/outcome: local twitch response with concordant symptoms in Lt arm.    TODAY'S TREATMENT:                                                                                                       DATE: 11/21/2023 Therex:    HEP instruction/performance c cues for techniques, handout provided.  Trial set performed of each for comprehension and symptom assessment.  See below for exercise list  Manual Percussive device Lt upper trap, Lt infraspinatus   PATIENT EDUCATION:  Education details: HEP, POC Person educated: Patient Education method: Explanation, Demonstration, Verbal cues, and Handouts Education comprehension: verbalized understanding, returned demonstration, and verbal cues required  HOME EXERCISE PROGRAM: Access Code: [email or web address] URL: [email or web address] Date: 11/21/2023 Prepared by: Ozell Silvan  Exercises - Seated Upper Trapezius Stretch (Mirrored)  - 2-3 x daily - 7 x weekly - 1 sets - 3-5 reps - 15 hold - Standing Shoulder Posterior Capsule Stretch  - 2-3 x daily - 7 x weekly - 1 sets - 3-5 reps - 15 hold - Seated Scapular Retraction  - 3-5 x daily - 7 x weekly - 1 sets - 5-10 reps - 3-5 hold - Cervical Retraction at Wall  - 2-3 x daily - 7 x weekly - 1 sets - 5-10 reps - 5 hold - Standing Isometric Shoulder External Rotation with Doorway  - 2-3 x daily - 7 x weekly - 1 sets - 5-10 reps - 5 hold  ASSESSMENT:  CLINICAL IMPRESSION: Some improvement noted in cervical range recheck today.  Distraction was positive  today with reduced Lt arm symptoms noted.  Use of manual and mechanical traction today and plan to reassess results for future use.     OBJECTIVE IMPAIRMENTS: decreased activity tolerance, decreased coordination, decreased endurance, decreased mobility, decreased ROM, increased fascial restrictions, impaired perceived functional ability, increased muscle spasms, impaired flexibility, impaired UE functional use, improper body mechanics, postural dysfunction, and pain.   ACTIVITY LIMITATIONS: carrying, lifting, sitting, sleeping, and locomotion level  PARTICIPATION LIMITATIONS: meal prep, cleaning, laundry, interpersonal relationship, driving, shopping, and community activity  PERSONAL FACTORS:  DDD cervical, GERD, HTN, hyperlipidemia, DM  are also affecting patient's functional outcome.   REHAB POTENTIAL: Good  CLINICAL DECISION MAKING: Stable/uncomplicated  EVALUATION COMPLEXITY: Low   GOALS: Goals reviewed with patient? Yes  SHORT TERM GOALS: (target date for Short term goals are 3 weeks 12/12/2023)  1.Patient will demonstrate independent use of home exercise program to maintain progress from in clinic treatments. Goal status: Met  LONG TERM GOALS: (target dates for all long term goals are 12 weeks  02/13/2024 )   1. Patient will demonstrate/report pain at worst less than or equal to 2/10 to facilitate minimal limitation in daily activity secondary to pain symptoms. Goal status: on going 12/18/2023   2. Patient will demonstrate independent use of home exercise program to facilitate ability to maintain/progress functional gains from skilled physical therapy services. Goal status: on going 12/18/2023   3. Patient will demonstrate FOTO outcome > or = 55 % to indicate reduced disability due to condition. Goal status: on going 12/18/2023   4.  Patient will demonstrate cervical AROM WFL s symptoms to facilitate usual head movements for daily activity including driving, self care.   Goal  status: on going 12/18/2023   5.  Patient will demonstrate/report ability to sleep s restriction due to symptoms.   Goal status: on going 12/18/2023    PLAN:  PT FREQUENCY: 1-2x/week  PT DURATION: 12 weeks  Can include 02853- PT Re-evaluation, 97110-Therapeutic exercises, 97530- Therapeutic activity, 97112- Neuromuscular re-education, 97535- Self Care, 97140- Manual therapy, [phone]- Aquatic Therapy, 97014- Electrical stimulation (unattended), [phone]- Traction (mechanical)  Patient/Family education, Balance training, Stair training, Taping, Dry Needling, Joint mobilization, Joint manipulation, Spinal manipulation, Spinal mobilization, Scar mobilization, Vestibular training, Visual/preceptual remediation/compensation, DME instructions, Cryotherapy, and Moist heat.  All performed as medically necessary.  All included unless contraindicated  PLAN FOR NEXT SESSION: Check traction, DN response.    Ozell Silvan, PT, DPT, OCS, ATC 12/18/23  1:35 PM     Referring diagnosis? M54.2 (ICD-10-CM) - Cervicalgia M79.18 (ICD-10-CM) - Myofascial pain syndrome R20.2 (ICD-10-CM) - Paresthesia of skin Treatment diagnosis? (if different than referring diagnosis) M54.2 (ICD-10-CM) - Cervicalgia What was this (referring dx) caused by? []  Surgery []  Fall [x]  Ongoing issue []  Arthritis []  Other: ____________  Laterality: []  Rt []  Lt [x]  Both  Check all possible CPT codes:  *CHOOSE 10 OR LESS*    See Planned Interventions listed in the Plan section of the Evaluation.

## 2023-12-25 ENCOUNTER — Ambulatory Visit (INDEPENDENT_AMBULATORY_CARE_PROVIDER_SITE_OTHER): Payer: Medicare HMO | Admitting: Rehabilitative and Restorative Service Providers"

## 2023-12-25 ENCOUNTER — Encounter: Payer: Self-pay | Admitting: Emergency Medicine

## 2023-12-25 ENCOUNTER — Ambulatory Visit (INDEPENDENT_AMBULATORY_CARE_PROVIDER_SITE_OTHER): Payer: Medicare HMO | Admitting: Emergency Medicine

## 2023-12-25 ENCOUNTER — Encounter: Payer: Self-pay | Admitting: Rehabilitative and Restorative Service Providers"

## 2023-12-25 VITALS — BP 136/90 | HR 86 | Temp 98.4°F | Ht 62.5 in | Wt 216.0 lb

## 2023-12-25 DIAGNOSIS — Z6838 Body mass index (BMI) 38.0-38.9, adult: Secondary | ICD-10-CM

## 2023-12-25 DIAGNOSIS — M79602 Pain in left arm: Secondary | ICD-10-CM | POA: Diagnosis not present

## 2023-12-25 DIAGNOSIS — K219 Gastro-esophageal reflux disease without esophagitis: Secondary | ICD-10-CM | POA: Diagnosis not present

## 2023-12-25 DIAGNOSIS — M150 Primary generalized (osteo)arthritis: Secondary | ICD-10-CM

## 2023-12-25 DIAGNOSIS — M542 Cervicalgia: Secondary | ICD-10-CM

## 2023-12-25 DIAGNOSIS — E1159 Type 2 diabetes mellitus with other circulatory complications: Secondary | ICD-10-CM

## 2023-12-25 DIAGNOSIS — N1831 Chronic kidney disease, stage 3a: Secondary | ICD-10-CM | POA: Diagnosis not present

## 2023-12-25 DIAGNOSIS — E66812 Obesity, class 2: Secondary | ICD-10-CM

## 2023-12-25 DIAGNOSIS — E1169 Type 2 diabetes mellitus with other specified complication: Secondary | ICD-10-CM

## 2023-12-25 DIAGNOSIS — R293 Abnormal posture: Secondary | ICD-10-CM

## 2023-12-25 DIAGNOSIS — I152 Hypertension secondary to endocrine disorders: Secondary | ICD-10-CM

## 2023-12-25 DIAGNOSIS — Z7984 Long term (current) use of oral hypoglycemic drugs: Secondary | ICD-10-CM

## 2023-12-25 DIAGNOSIS — E785 Hyperlipidemia, unspecified: Secondary | ICD-10-CM | POA: Diagnosis not present

## 2023-12-25 LAB — POCT GLYCOSYLATED HEMOGLOBIN (HGB A1C): Hemoglobin A1C: 6.2 % — AB (ref 4.0–5.6)

## 2023-12-25 MED ORDER — EMPAGLIFLOZIN 10 MG PO TABS: 10.0000 mg | ORAL_TABLET | Freq: Every day | ORAL | 3 refills | Status: DC

## 2023-12-25 MED ORDER — FAMOTIDINE 40 MG PO TABS: ORAL_TABLET | ORAL | 2 refills | Status: AC

## 2023-12-25 NOTE — Assessment & Plan Note (Signed)
 Advised to stay well-hydrated and avoid NSAIDs Diet and nutrition discussed Recommend to start Jardiance 10 mg daily

## 2023-12-25 NOTE — Progress Notes (Signed)
 Eileen Jefferson 73 y.o.   Chief Complaint  Patient presents with   Follow-up    3 month f/u for DM. Patient states the rybelsus  she has stop taking because it makes her nauseous     HISTORY OF PRESENT ILLNESS: This is a 73 y.o. female here for 86-month follow-up of chronic medical conditions including diabetes Intolerant to Rybelsus .  Once a day for medication. Overall doing better.  Musculoskeletal chronic pains much improved No other complaints or medical concerns today. Lab Results  Component Value Date   HGBA1C 6.2 (A) 09/24/2023   BP Readings from Last 3 Encounters:  12/25/23 (!) 136/90  11/06/23 (!) 134/90  10/16/23 134/80   Wt Readings from Last 3 Encounters:  12/25/23 216 lb (98 kg)  11/06/23 213 lb (96.6 kg)  10/16/23 214 lb 6 oz (97.2 kg)     HPI   Prior to Admission medications   Medication Sig Start Date End Date Taking? Authorizing Provider  ACCU-CHEK AVIVA PLUS test strip TEST BLOOD SUGAR ONE TIME DAILY 10/20/23  Yes Virgil Lightner, Emil Schanz, MD  Accu-Chek Softclix Lancets lancets TEST BLOOD SUGAR ONE TIME DAILY 10/20/23  Yes Purcell Emil Schanz, MD  Alcohol  Swabs  (DROPSAFE ALCOHOL  PREP) 70 % PADS USE TO BLOOD SUGAR EVERY DAY 10/19/22  Yes Heberto Sturdevant Jose, MD  allopurinol  (ZYLOPRIM ) 100 MG tablet TAKE 1 TABLET EVERY DAY 03/15/23  Yes Andalyn Heckstall Jose, MD  aspirin EC 81 MG tablet Take 81 mg by mouth daily.   Yes [provider]  baclofen  (LIORESAL ) 10 MG tablet Take 1 tablet (10 mg total) by mouth 3 (three) times daily. 11/12/23  Yes Williams, Megan E, NP  Blood Glucose Calibration (ACCU-CHEK AVIVA) SOLN Test blood sugar once daily. Dx: E11.9 10/25/16  Yes Loreli Elyn SAILOR, MD  Blood Glucose Monitoring Suppl (ACCU-CHEK AVIVA PLUS) w/Device KIT Test blood sugar once daily. Dx: E11.9 10/25/16  Yes Loreli Elyn SAILOR, MD  carboxymethylcellulose (REFRESH PLUS) 0.5 % SOLN 1 drop daily as needed.   Yes [provider]  colchicine  0.6 MG tablet Take  1 tablet (0.6 mg total) by mouth daily as needed (gout or psuedogout pain). 10/18/21  Yes Corey, Evan S, MD  famotidine  (PEPCID ) 40 MG tablet TAKE 1 TABLET (40 MG TOTAL) BY MOUTH 2  TIMES DAILY AS NEEDED FOR HEARTBURN OR INDIGESTION. 12/25/23  Yes Tangee Marszalek, Emil Schanz, MD  Latanoprost PF (IYUZEH) 0.005 % SOLN Apply to eye at bedtime. Drop in each eye at bedtime   Yes [provider]  meloxicam  (MOBIC ) 15 MG tablet TAKE 1 TABLET (15 MG TOTAL) BY MOUTH DAILY. 12/13/23 12/12/24 Yes Williams, Megan E, NP  omeprazole  (PRILOSEC) 40 MG capsule TAKE 1 CAPSULE EVERY DAY 30 TO 60 MINUTES BEFORE BREAKFAST 11/07/23  Yes Purcell Emil Schanz, MD  rosuvastatin  (CRESTOR ) 40 MG tablet TAKE 1 TABLET EVERY DAY 12/08/23  Yes Micayla Brathwaite Jose, MD  valsartan  (DIOVAN ) 80 MG tablet TAKE 1 TABLET EVERY DAY 10/20/23  Yes Avanti Jetter, Emil Schanz, MD  Semaglutide  (RYBELSUS ) 7 MG TABS Take 1 tablet (7 mg total) by mouth daily. Patient not taking: Reported on 12/25/2023 09/24/23   Purcell Emil Schanz, MD    Allergies  Allergen Reactions   Contrast Media [Iodinated Contrast Media]    Iohexol Other (See Comments)     made me feel like I was burning inside    Patient Active Problem List   Diagnosis Date Noted   Primary osteoarthritis involving multiple joints 11/06/2023   Upper back  pain 11/06/2023   Cervical radiculopathy 10/16/2023   Diabetes (HCC) 09/25/2021   Stage 3a chronic kidney disease (HCC) 11/08/2020   Retinal telangiectasia of right eye 05/18/2020   Retinal telangiectasia of left eye 05/18/2020   Cystoid macular edema of left eye 05/18/2020   Hyperuricemia 04/27/2020   Musculoskeletal pain 12/23/2018   Gastroesophageal reflux disease 11/23/2018   DDD (degenerative disc disease), cervical 07/11/2017   Localized osteoarthrosis of right shoulder region 07/11/2017   Anemia 07/11/2017   Hyperlipidemia 06/28/2016   Thyroid  disease 06/28/2016   Diabetic retinopathy (HCC) 05/05/2015   Class 2  severe obesity due to excess calories with serious comorbidity and body mass index (BMI) of 38.0 to 38.9 in adult [facility]) 01/11/2015   Glaucoma 04/06/2014   Macular degeneration 04/06/2014   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, 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   Hypertension associated with diabetes (HCC) 10/02/2013   Sarcoidosis 10/02/2013   Dyslipidemia associated with type 2 diabetes mellitus (HCC) 10/02/2013   OSA (obstructive sleep apnea) 10/23/2012    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   Iron deficiency anemia    Pulmonary sarcoidosis (HCC)    Sleep apnea    uses CPAP   Thyroid  disease    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)     Past Surgical History:  Procedure Laterality Date   ABDOMINAL HYSTERECTOMY N/A    [facility] 11/14/2020   APPENDECTOMY     c-section     x 2   CATARACT EXTRACTION     bilateral   CESAREAN SECTION N/A    [employer] 11/14/2020   COLONOSCOPY     EYE SURGERY N/A    [employer] 11/14/2020   SCLERAL BUCKLE     right   TONSILLECTOMY     TOTAL ABDOMINAL HYSTERECTOMY     TUBAL LIGATION     VITRECTOMY     bilateral    Social History   Socioeconomic History   Marital status: Married    Spouse name: Not on file   Number of children: 2   Years of education: Not on file   Highest education level: Bachelor's degree (e.g., BA, AB, BS)  Occupational History   Occupation: behavioral health  Tobacco Use   Smoking status: Former    Current packs/day: 0.00    Average packs/day: 0.2 packs/day for 1 year (0.2 ttl pk-yrs)    Types: Cigarettes    Start date: 12/12/1967    Quit date: 12/11/1968    Years  since quitting: 55.0   Smokeless tobacco: Never  Vaping Use   Vaping status: Never Used  Substance and Sexual Activity   Alcohol  use: No   Drug use: No   Sexual activity: Not on file  Other Topics Concern   Not on file  Social History Narrative   Not on file   Social Drivers of Health   Financial Resource Strain: Low Risk  (12/24/2023)   Overall Financial Resource Strain (CARDIA)    Difficulty of Paying Living Expenses: Not hard at all  Food Insecurity: No Food Insecurity (12/24/2023)   Hunger Vital Sign    Worried About Programme Researcher, Broadcasting/film/video in  the Last Year: Never true    Ran Out of Food in the Last Year: Never true  Transportation Needs: No Transportation Needs (12/24/2023)   PRAPARE - Administrator, Civil Service (Medical): No    Lack of Transportation (Non-Medical): No  Physical Activity: Inactive (12/24/2023)   Exercise Vital Sign    Days of Exercise per Week: 0 days    Minutes of Exercise per Session: 0 min  Stress: No Stress Concern Present (12/24/2023)   [employer] of Occupational Health - Occupational Stress Questionnaire    Feeling of Stress : Not at all  Social Connections: Socially Integrated (12/24/2023)   Social Connection and Isolation Panel [NHANES]    Frequency of Communication with Friends and Family: More than three times a week    Frequency of Social Gatherings with Friends and Family: Once a week    Attends Religious Services: More than 4 times per year    Active Member of [employer] or Organizations: Yes    Attends Engineer, Structural: More than 4 times per year    Marital Status: Married  Catering Manager Violence: Not At Risk (03/27/2023)   Humiliation, Afraid, Rape, and Kick questionnaire    Fear of Current or Ex-Partner: No    Emotionally Abused: No    Physically Abused: No    Sexually Abused: No    Family History  Problem Relation Age of Onset   Colon polyps Mother        brothers x2   Clotting disorder Mother         brother x 2, MGM   Heart disease Brother        mother, MGM   Diabetes Brother        x 2, MGM   Stroke Maternal Grandmother    Colon cancer Neg Hx    Breast cancer Neg Hx    Esophageal cancer Neg Hx    Rectal cancer Neg Hx    Stomach cancer Neg Hx      Review of Systems  Constitutional: Negative.  Negative for chills and fever.  HENT: Negative.  Negative for congestion and sore throat.   Respiratory: Negative.  Negative for cough and shortness of breath.   Cardiovascular: Negative.  Negative for chest pain and palpitations.  Gastrointestinal:  Negative for abdominal pain, nausea and vomiting.  Genitourinary: Negative.  Negative for dysuria and hematuria.  Skin: Negative.  Negative for rash.  Neurological: Negative.  Negative for dizziness and headaches.  All other systems reviewed and are negative.   Vitals:   12/25/23 0911  BP: (!) 136/90  Pulse: 86  Temp: 98.4 F (36.9 C)  SpO2: 93%    Physical Exam Vitals reviewed.  Constitutional:      Appearance: Normal appearance.  HENT:     Head: Normocephalic.     Mouth/Throat:     Mouth: Mucous membranes are moist.     Pharynx: Oropharynx is clear.  Eyes:     Extraocular Movements: Extraocular movements intact.     Pupils: Pupils are equal, round, and reactive to light.  Cardiovascular:     Rate and Rhythm: Normal rate and regular rhythm.     Pulses: Normal pulses.     Heart sounds: Normal heart sounds.  Pulmonary:     Effort: Pulmonary effort is normal.     Breath sounds: Normal breath sounds.  Musculoskeletal:     Cervical back: No tenderness.  Lymphadenopathy:     Cervical: No cervical adenopathy.  Skin:    General: Skin is warm and dry.     Capillary Refill: Capillary refill takes less than 2 seconds.  Neurological:     General: No focal deficit present.     Mental Status: She is alert and oriented to person, place, and time.  Psychiatric:        Mood and Affect: Mood normal.        Behavior: Behavior  normal.    Results for orders placed or performed in visit on 12/25/23 (from the past 24 hours)  POCT HgB A1C     Status: Abnormal   Collection Time: 12/25/23  9:28 AM  Result Value Ref Range   Hemoglobin A1C 6.2 (A) 4.0 - 5.6 %   HbA1c POC (<> result, manual entry)     HbA1c, POC (prediabetic range)     HbA1c, POC (controlled diabetic range)       ASSESSMENT & PLAN: A total of 46 minutes was spent with the patient and counseling/coordination of care regarding preparing for this visit, review of most recent office visit notes, review of multiple chronic medical conditions and their management, cardiovascular risks associated with hypertension and diabetes, review of all medications and changes made, review of most recent bloodwork results including interpretation of today's hemoglobin A1c, review of health maintenance items, education on nutrition, prognosis, documentation, and need for follow up.   Problem List Items Addressed This Visit       Cardiovascular and Mediastinum   Hypertension associated with diabetes (HCC)   BP Readings from Last 3 Encounters:  12/25/23 (!) 136/90  11/06/23 (!) 134/90  10/16/23 134/80  Elevated blood pressure reading in the office today but normal at home. Continue valsartan  80 mg daily Well-controlled diabetes with hemoglobin A1c of 6.2 Intolerant to Rybelsus  Cardiovascular risks associated with hypertension and diabetes discussed Diet and nutrition discussed Recommend to start SGLT inhibitors as per insurance's formulary Has been on Farxiga  before but copayment was too high Will send new prescription for either Jardiance  or Farxiga  today Follow-up in 3 months       Relevant Medications   empagliflozin  (JARDIANCE ) 10 MG TABS tablet     Digestive   Gastroesophageal reflux disease   Clinically stable and asymptomatic Continues omeprazole  40 mg daily as needed      Relevant Medications   famotidine  (PEPCID ) 40 MG tablet     Endocrine    Dyslipidemia associated with type 2 diabetes mellitus (HCC) - Primary   Stable chronic condition Hemoglobin A1c is 6.2 Continues rosuvastatin  40 mg daily Diet and nutrition discussed      Relevant Medications   empagliflozin  (JARDIANCE ) 10 MG TABS tablet   Other Relevant Orders   POCT HgB A1C (Completed)     Musculoskeletal and Integument   Primary osteoarthritis involving multiple joints   Well-controlled pain Advised to avoid NSAIDs as much as possible Tylenol for pain as needed        Genitourinary   Stage 3a chronic kidney disease (HCC)   Advised to stay well-hydrated and avoid NSAIDs Diet and nutrition discussed Recommend to start Jardiance  10 mg daily      Relevant Medications   empagliflozin  (JARDIANCE ) 10 MG TABS tablet     Other   Class 2 severe obesity due to excess calories with serious comorbidity and body mass index (BMI) of 38.0 to 38.9 in adult [facility])   Diet and nutrition discussed Benefits of exercise discussed Advised to decrease amount of daily carbohydrate intake and daily calories  and increase amount of plant-based protein in her diet      Relevant Medications   empagliflozin  (JARDIANCE ) 10 MG TABS tablet   Patient Instructions  Diabetes Mellitus and Nutrition, Adult When you have diabetes, or diabetes mellitus, it is very important to have healthy eating habits because your blood sugar (glucose) levels are greatly affected by what you eat and drink. Eating healthy foods in the right amounts, at about the same times every day, can help you: Manage your blood glucose. Lower your risk of heart disease. Improve your blood pressure. Reach or maintain a healthy weight. What can affect my meal plan? Every person with diabetes is different, and each person has different needs for a meal plan. Your health care provider may recommend that you work with a dietitian to make a meal plan that is best for you. Your meal plan may vary depending on factors such  as: The calories you need. The medicines you take. Your weight. Your blood glucose, blood pressure, and cholesterol levels. Your activity level. Other health conditions you have, such as heart or kidney disease. How do carbohydrates affect me? Carbohydrates, also called carbs, affect your blood glucose level more than any other type of food. Eating carbs raises the amount of glucose in your blood. It is important to know how many carbs you can safely have in each meal. This is different for every person. Your dietitian can help you calculate how many carbs you should have at each meal and for each snack. How does alcohol  affect me? Alcohol  can cause a decrease in blood glucose (hypoglycemia), especially if you use insulin or take certain diabetes medicines by mouth. Hypoglycemia can be a life-threatening condition. Symptoms of hypoglycemia, such as sleepiness, dizziness, and confusion, are similar to symptoms of having too much alcohol . Do not drink alcohol  if: Your health care provider tells you not to drink. You are pregnant, may be pregnant, or are planning to become pregnant. If you drink alcohol : Limit how much you have to: 0-1 drink a day for women. 0-2 drinks a day for men. Know how much alcohol  is in your drink. In the [place] one drink equals one 12 oz bottle of beer (355 mL), one 5 oz glass of wine (148 mL), or one 1 oz glass of hard liquor (44 mL). Keep yourself hydrated with water, diet soda, or unsweetened iced tea. Keep in mind that regular soda, juice, and other mixers may contain a lot of sugar and must be counted as carbs. What are tips for following this plan?  Reading food labels Start by checking the serving size on the Nutrition Facts label of packaged foods and drinks. The number of calories and the amount of carbs, fats, and other nutrients listed on the label are based on one serving of the item. Many items contain more than one serving per package. Check the total  grams (g) of carbs in one serving. Check the number of grams of saturated fats and trans fats in one serving. Choose foods that have a low amount or none of these fats. Check the number of milligrams (mg) of salt (sodium) in one serving. Most people should limit total sodium intake to less than 2,300 mg per day. Always check the nutrition information of foods labeled as low-fat or nonfat. These foods may be higher in added sugar or refined carbs and should be avoided. Talk to your dietitian to identify your daily goals for nutrients listed on the label. Shopping Avoid buying  canned, pre-made, or processed foods. These foods tend to be high in fat, sodium, and added sugar. Shop around the outside edge of the grocery store. This is where you will most often find fresh fruits and vegetables, bulk grains, fresh meats, and fresh dairy products. Cooking Use low-heat cooking methods, such as baking, instead of high-heat cooking methods, such as deep frying. Cook using healthy oils, such as olive, canola, or sunflower oil. Avoid cooking with butter, cream, or high-fat meats. Meal planning Eat meals and snacks regularly, preferably at the same times every day. Avoid going long periods of time without eating. Eat foods that are high in fiber, such as fresh fruits, vegetables, beans, and whole grains. Eat 4-6 oz (112-168 g) of lean protein each day, such as lean meat, chicken, fish, eggs, or tofu. One ounce (oz) (28 g) of lean protein is equal to: 1 oz (28 g) of meat, chicken, or fish. 1 egg.  cup (62 g) of tofu. Eat some foods each day that contain healthy fats, such as avocado, nuts, seeds, and fish. What foods should I eat? Fruits Berries. Apples. Oranges. Peaches. Apricots. Plums. Grapes. Mangoes. Papayas. Pomegranates. Kiwi. Cherries. Vegetables Leafy greens, including lettuce, spinach, kale, chard, collard greens, mustard greens, and cabbage. Beets. Cauliflower. Broccoli. Carrots. Green  beans. Tomatoes. Peppers. Onions. Cucumbers. Brussels sprouts. Grains Whole grains, such as whole-wheat or whole-grain bread, crackers, tortillas, cereal, and pasta. Unsweetened oatmeal. Quinoa. Brown or wild rice. Meats and other proteins Seafood. Poultry without skin. Lean cuts of poultry and beef. Tofu. Nuts. Seeds. Dairy Low-fat or fat-free dairy products such as milk, yogurt, and cheese. The items listed above may not be a complete list of foods and beverages you can eat and drink. Contact a dietitian for more information. What foods should I avoid? Fruits Fruits canned with syrup. Vegetables Canned vegetables. Frozen vegetables with butter or cream sauce. Grains Refined white flour and flour products such as bread, pasta, snack foods, and cereals. Avoid all processed foods. Meats and other proteins Fatty cuts of meat. Poultry with skin. Breaded or fried meats. Processed meat. Avoid saturated fats. Dairy Full-fat yogurt, cheese, or milk. Beverages Sweetened drinks, such as soda or iced tea. The items listed above may not be a complete list of foods and beverages you should avoid. Contact a dietitian for more information. Questions to ask a health care provider Do I need to meet with a certified diabetes care and education specialist? Do I need to meet with a dietitian? What number can I call if I have questions? When are the best times to check my blood glucose? Where to find more information: American Diabetes Association: diabetes.org Academy of Nutrition and Dietetics: eatright.[employer] of Diabetes and Digestive and Kidney Diseases: stagesync.si Association of Diabetes Care & Education Specialists: diabeteseducator.org Summary It is important to have healthy eating habits because your blood sugar (glucose) levels are greatly affected by what you eat and drink. It is important to use alcohol  carefully. A healthy meal plan will help you manage your blood glucose  and lower your risk of heart disease. Your health care provider may recommend that you work with a dietitian to make a meal plan that is best for you. This information is not intended to replace advice given to you by your health care provider. Make sure you discuss any questions you have with your health care provider. Document Revised: 06/30/2020 Document Reviewed: 06/30/2020 Elsevier Patient Education  2024 Elsevier Inc.    Emil Schaumann, MD  [facility]

## 2023-12-25 NOTE — Assessment & Plan Note (Signed)
Clinically stable and asymptomatic Continues omeprazole 40 mg daily as needed

## 2023-12-25 NOTE — Patient Instructions (Signed)

## 2023-12-25 NOTE — Therapy (Signed)
 OUTPATIENT PHYSICAL THERAPY TREATMENT   Patient Name: Eileen Jefferson MRN: [id number] DOB:1951-09-03, 73 y.o., female Today's Date: 12/25/2023  END OF SESSION:  PT End of Session - 12/25/23 1257     Visit Number 4    Number of Visits 24    Date for PT Re-Evaluation 02/13/24    Authorization Type [employer] $25    Authorization Time Period -02/13/2024    Authorization - Visit Number 4    Authorization - Number of Visits 12    Progress Note Due on Visit 10    PT Start Time 1259    PT Stop Time 1340    PT Time Calculation (min) 41 min    Activity Tolerance Patient tolerated treatment well    Behavior During Therapy WFL for tasks assessed/performed                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   Iron deficiency anemia    Pulmonary sarcoidosis (HCC)    Sleep apnea    uses CPAP   Thyroid  disease    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)    Past Surgical History:  Procedure Laterality Date   ABDOMINAL HYSTERECTOMY N/A    [employer] 11/14/2020   APPENDECTOMY     c-section     x 2   CATARACT EXTRACTION     bilateral   CESAREAN SECTION N/A    [employer] 11/14/2020   COLONOSCOPY     EYE SURGERY N/A    [employer] 11/14/2020   SCLERAL BUCKLE     right   TONSILLECTOMY     TOTAL ABDOMINAL HYSTERECTOMY     TUBAL LIGATION     VITRECTOMY     bilateral   Patient Active Problem List   Diagnosis Date Noted   Primary osteoarthritis involving multiple joints 11/06/2023   Cervical radiculopathy 10/16/2023   Diabetes (HCC) 09/25/2021   Stage 3a chronic kidney disease (HCC) 11/08/2020   Retinal  telangiectasia of right eye 05/18/2020   Retinal telangiectasia of left eye 05/18/2020   Cystoid macular edema of left eye 05/18/2020   Hyperuricemia 04/27/2020   Musculoskeletal pain 12/23/2018   Gastroesophageal reflux disease 11/23/2018   DDD (degenerative disc disease), cervical 07/11/2017   Localized osteoarthrosis of right shoulder region 07/11/2017   Anemia 07/11/2017   Hyperlipidemia 06/28/2016   Thyroid  disease 06/28/2016   Diabetic retinopathy (HCC) 05/05/2015   Class 2 severe obesity due to excess calories with serious comorbidity and body mass index (BMI) of 38.0 to 38.9 in adult [facility]) 01/11/2015   Glaucoma 04/06/2014   Macular degeneration 04/06/2014   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, 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   Hypertension associated with diabetes (HCC) 10/02/2013   Sarcoidosis 10/02/2013   Dyslipidemia associated with type 2 diabetes mellitus (HCC) 10/02/2013   OSA (obstructive sleep apnea) 10/23/2012    PCP: Purcell Emil Schanz MD  REFERRING PROVIDER: Trudy Duwaine BRAVO, NP  REFERRING DIAG: M54.2 (ICD-10-CM) - Cervicalgia M79.18 (ICD-10-CM) - Myofascial pain syndrome R20.2 (ICD-10-CM) - Paresthesia of skin  THERAPY DIAG:  Cervicalgia  Left arm pain  Abnormal posture  Rationale for Evaluation and Treatment: Rehabilitation  ONSET DATE: 09/2023  SUBJECTIVE:                                                                                                                                                                                                         SUBJECTIVE STATEMENT: Pt reported feeling better with reduced symptoms in areas.  Reported a little tingling in arm upon arrival.   PERTINENT HISTORY:  DDD cervical, GERD, HTN, hyperlipidemia, DM  PAIN:  NPRS scale: 1/10 upon arrival.  Pain location: cervical pain, Lt arm symptoms  Pain description: constant, dull achy pain Aggravating factors: househwork, sleeping,  Relieving factors: extra strength  tylenol, heat.   PRECAUTIONS: None  WEIGHT BEARING RESTRICTIONS: No  FALLS:  Has patient fallen in last 6 months? No  LIVING ENVIRONMENT: Lives in: House/apartment  OCCUPATION: Retired  PLOF: Independent, Rt hand dominant. Housework, reading.    PATIENT GOALS: Reduce pain.   OBJECTIVE:   PATIENT SURVEYS:  12/25/2023: FOTO update:  66  11/21/2023 FOTO intake:  45  predicted:  55  COGNITION: 11/21/2023 Overall cognitive status: Within functional limits for tasks assessed  SENSATION: 11/21/2023 WFL  POSTURE:  11/21/2023 rounded shoulders and forward head  PALPATION: 11/21/2023 Trigger points, tightness in Lt upper trap, Lt cervical paraspinals, Lt infraspinatus.  Possible connection to Lt arm symptoms observed from Lt infraspinatus.    CERVICAL ROM:   ROM AROM (deg) 11/21/2023 AROM 12/17/2022  Flexion 52 with no symptoms change 50  Extension 35 c neck pain with no arm symptoms 50  Right lateral flexion    Left lateral flexion    Right rotation 65 c no changes 65  Left rotation 74 c no changes 65   (Blank rows = not tested)  UPPER EXTREMITY ROM:   ROM Right 11/21/2023 Left 11/21/2023  Shoulder flexion    Shoulder extension    Shoulder abduction    Shoulder adduction    Shoulder extension    Shoulder internal rotation    Shoulder external rotation    Elbow flexion    Elbow extension    Wrist flexion    Wrist extension    Wrist ulnar deviation    Wrist radial deviation    Wrist pronation    Wrist supination     (Blank rows = not tested)  UPPER EXTREMITY MMT:  MMT Right 11/21/2023 Left 11/21/2023  Shoulder flexion 5/5 5/5  Shoulder extension    Shoulder abduction 5/5 5/5  Shoulder adduction    Shoulder extension    Shoulder internal rotation 5/5 5/5  Shoulder external rotation 5/5 5/5  Middle trapezius    Lower trapezius    Elbow flexion 5/5 5/5  Elbow extension 5/5 5/5  Wrist flexion    Wrist extension    Wrist ulnar deviation     Wrist radial deviation    Wrist pronation    Wrist supination    Grip strength     (Blank rows = not tested)  CERVICAL SPECIAL TESTS:  12/18/2023: Cervical distraction positive  11/21/2023 (-) spurlings compression  FUNCTIONAL TESTS:  11/21/2023 No specific testing.                                                                                                                                                                                  TODAY'S TREATMENT:                                                                                                       DATE: 12/25/2023 Therex: UBE UE only 3 mins fwd/back each with 1 min rest between directions.  Lvl 2.5  Supine cervical retraction 5 sec hold x 10  Upper trap Lt self stretch 15 sec x 3  Standing green band ER walk outs 5 sec hold x 10 c towel under arm, performed bilaterally (additional verbal and visual cues) Tband rows bilateral 2 x 15 green   Tband GH ext bilateral 2 x 15 green  Cues for additions of HEP.   Mechanical Traction Supine cervical traction 10 mins with intermittent 15/10 lbs 60sec/20secs  TODAY'S TREATMENT:                                                                                                       DATE: 12/18/2023 Manual Supine cervical distraction intermittent with manual pull. Skilled palpation with dry needling.   Trigger Point Dry Needling Subsequent Treatment: Instructions provided previously at initial dry needling  treatment.  Patient Verbal Consent Given: Yes Education Handout Provided: Yes Muscles treated: Lt upper trap  Treatment response/outcome: local twitch response  Therex: Supine cervical retraction 5 sec hold x 10  Upper trap Lt self stretch 15 sec x 3  Tband rows bilateral 2 x 15 green   Tband GH ext bilateral 2 x 15 greenn  Mechanical Traction Supine cervical traction 10 mins with intermittent 15/10 lbs 60sec/20secs   TODAY'S TREATMENT:                                                                                                        DATE: 12/13/2023 Therex: UBE fwd/back 4 mins each way with 1 min rest break lvl 2.5  Tband rows green 2 x 15 Tband gh ext 2 x 15 Cross arm stretch 15 sec x 3 Lt arm    Manual Percussive device Lt upper trap, Lt infraspinatus   Trigger Point Dry Needling Initial Treatment: Pt instructed on Dry Needling rational, procedures, and possible side effects. Pt instructed to expect mild to moderate muscle soreness later in the day and/or into the next day.  Pt instructed in methods to reduce muscle soreness. Pt instructed to continue prescribed HEP. Because Dry Needling was performed over or adjacent to a lung field, pt was educated on S/S of pneumothorax and to seek immediate medical attention should they occur.  Patient was educated on signs and symptoms of infection and other risk factors and advised to seek medical attention should they occur.  Patient verbalized understanding of these instructions and education.  Patient Verbal Consent Given: Yes Education Handout Provided: Yes Muscles treated: Lt infraspinatus Treatment response/outcome: local twitch response with concordant symptoms in Lt arm.   PATIENT EDUCATION:  12/25/2023:  Education details: HEP update Person educated: Patient Education method: Solicitor, Verbal cues, and Handouts Education comprehension: verbalized understanding, returned demonstration, and verbal cues required  HOME EXERCISE PROGRAM: Access Code: [email or web address] URL: [email or web address] Date: 12/25/2023 Prepared by: Ozell Silvan  Exercises - Seated Upper Trapezius Stretch (Mirrored)  - 2-3 x daily - 7 x weekly - 1 sets - 3-5 reps - 15 hold - Standing Shoulder Posterior Capsule Stretch  - 2-3 x daily - 7 x weekly - 1 sets - 3-5 reps - 15 hold - Seated Scapular Retraction  - 3-5 x daily - 7 x weekly - 1 sets - 5-10 reps - 3-5 hold - Cervical Retraction at Wall  -  2-3 x daily - 7 x weekly - 1 sets - 5-10 reps - 5 hold - Standing Isometric Shoulder External Rotation with Doorway  - 2-3 x daily - 7 x weekly - 1 sets - 5-10 reps - 5 hold - Standing Bilateral Low Shoulder Row with Anchored Resistance  - 1-2 x daily - 7 x weekly - 2-3 sets - 10-15 reps - Shoulder Extension with Resistance  - 1-2 x daily - 7 x weekly - 1-2 sets - 10-15 reps - Shoulder External Rotation Reactive Isometrics  - 1 x daily - 7 x weekly - 1 sets - 10 reps -  5-15 hold  ASSESSMENT:  CLINICAL IMPRESSION: Positive improvement noted with addition of cervical traction. Repeated use today to continue improvements.  FOTO reassessment showed great improvement compared to eval and actually progressing passed FOTO general goal. May continue from skilled PT services to continue to address impairments.    OBJECTIVE IMPAIRMENTS: decreased activity tolerance, decreased coordination, decreased endurance, decreased mobility, decreased ROM, increased fascial restrictions, impaired perceived functional ability, increased muscle spasms, impaired flexibility, impaired UE functional use, improper body mechanics, postural dysfunction, and pain.   ACTIVITY LIMITATIONS: carrying, lifting, sitting, sleeping, and locomotion level  PARTICIPATION LIMITATIONS: meal prep, cleaning, laundry, interpersonal relationship, driving, shopping, and community activity  PERSONAL FACTORS:  DDD cervical, GERD, HTN, hyperlipidemia, DM  are also affecting patient's functional outcome.   REHAB POTENTIAL: Good  CLINICAL DECISION MAKING: Stable/uncomplicated  EVALUATION COMPLEXITY: Low   GOALS: Goals reviewed with patient? Yes  SHORT TERM GOALS: (target date for Short term goals are 3 weeks 12/12/2023)  1.Patient will demonstrate independent use of home exercise program to maintain progress from in clinic treatments. Goal status: Met  LONG TERM GOALS: (target dates for all long term goals are 12 weeks  02/13/2024 )    1. Patient will demonstrate/report pain at worst less than or equal to 2/10 to facilitate minimal limitation in daily activity secondary to pain symptoms. Goal status: on going 12/18/2023   2. Patient will demonstrate independent use of home exercise program to facilitate ability to maintain/progress functional gains from skilled physical therapy services. Goal status: on going 12/18/2023   3. Patient will demonstrate FOTO outcome > or = 55 % to indicate reduced disability due to condition. Goal status: on going 12/18/2023   4.  Patient will demonstrate cervical AROM WFL s symptoms to facilitate usual head movements for daily activity including driving, self care.   Goal status: on going 12/18/2023   5.  Patient will demonstrate/report ability to sleep s restriction due to symptoms.   Goal status: on going 12/18/2023    PLAN:  PT FREQUENCY: 1-2x/week  PT DURATION: 12 weeks  Can include 02853- PT Re-evaluation, 97110-Therapeutic exercises, 97530- Therapeutic activity, 97112- Neuromuscular re-education, 97535- Self Care, 97140- Manual therapy, [phone]- Aquatic Therapy, 97014- Electrical stimulation (unattended), [phone]- Traction (mechanical)  Patient/Family education, Balance training, Stair training, Taping, Dry Needling, Joint mobilization, Joint manipulation, Spinal manipulation, Spinal mobilization, Scar mobilization, Vestibular training, Visual/preceptual remediation/compensation, DME instructions, Cryotherapy, and Moist heat.  All performed as medically necessary.  All included unless contraindicated  PLAN FOR NEXT SESSION: Traction use as necessary.  Possible reduced frequency pending symptoms.  MD office visit on 01/07/2024.    Ozell Silvan, PT, DPT, OCS, ATC 12/25/23  1:38 PM     Referring diagnosis? M54.2 (ICD-10-CM) - Cervicalgia M79.18 (ICD-10-CM) - Myofascial pain syndrome R20.2 (ICD-10-CM) - Paresthesia of skin Treatment diagnosis? (if different than referring diagnosis)  M54.2 (ICD-10-CM) - Cervicalgia What was this (referring dx) caused by? []  Surgery []  Fall [x]  Ongoing issue []  Arthritis []  Other: ____________  Laterality: []  Rt []  Lt [x]  Both  Check all possible CPT codes:  *CHOOSE 10 OR LESS*    See Planned Interventions listed in the Plan section of the Evaluation.

## 2023-12-25 NOTE — Assessment & Plan Note (Signed)
 Well-controlled pain Advised to avoid NSAIDs as much as possible Tylenol for pain as needed

## 2023-12-25 NOTE — Assessment & Plan Note (Signed)
 Stable chronic condition Hemoglobin A1c is 6.2 Continues rosuvastatin 40 mg daily Diet and nutrition discussed

## 2023-12-25 NOTE — Assessment & Plan Note (Signed)
 BP Readings from Last 3 Encounters:  12/25/23 (!) 136/90  11/06/23 (!) 134/90  10/16/23 134/80  Elevated blood pressure reading in the office today but normal at home. Continue valsartan  80 mg daily Well-controlled diabetes with hemoglobin A1c of 6.2 Intolerant to Rybelsus  Cardiovascular risks associated with hypertension and diabetes discussed Diet and nutrition discussed Recommend to start SGLT inhibitors as per insurance's formulary Has been on Farxiga  before but copayment was too high Will send new prescription for either Jardiance  or Farxiga  today Follow-up in 3 months

## 2023-12-25 NOTE — Assessment & Plan Note (Signed)
 Diet and nutrition discussed Benefits of exercise discussed Advised to decrease amount of daily carbohydrate intake and daily calories and increase amount of plant based protein in her diet.

## 2024-01-02 ENCOUNTER — Ambulatory Visit: Payer: Medicare HMO | Admitting: Rehabilitative and Restorative Service Providers"

## 2024-01-02 ENCOUNTER — Encounter: Payer: Self-pay | Admitting: Rehabilitative and Restorative Service Providers"

## 2024-01-02 DIAGNOSIS — R293 Abnormal posture: Secondary | ICD-10-CM

## 2024-01-02 DIAGNOSIS — M79602 Pain in left arm: Secondary | ICD-10-CM | POA: Diagnosis not present

## 2024-01-02 DIAGNOSIS — M542 Cervicalgia: Secondary | ICD-10-CM

## 2024-01-02 NOTE — Therapy (Addendum)
 OUTPATIENT PHYSICAL THERAPY TREATMENT / DISCHARGE   Patient Name: Eileen Jefferson MRN: [id number] DOB:1950/12/24, 73 y.o., female Today's Date: 01/02/2024  END OF SESSION:  PT End of Session - 01/02/24 0755     Visit Number 5    Number of Visits 24    Date for PT Re-Evaluation 02/13/24    Authorization Type [employer] $25    Authorization Time Period -02/13/2024    Authorization - Visit Number 5    Authorization - Number of Visits 12    Progress Note Due on Visit 10    PT Start Time 0756    PT Stop Time 0836    PT Time Calculation (min) 40 min    Activity Tolerance Patient tolerated treatment well    Behavior During Therapy [facility] for tasks assessed/performed                 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   Iron deficiency anemia    Pulmonary sarcoidosis (HCC)    Sleep apnea    uses CPAP   Thyroid disease    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)    Past Surgical History:  Procedure Laterality Date   ABDOMINAL HYSTERECTOMY N/A    [employer] 11/14/2020   APPENDECTOMY     c-section     x 2   CATARACT EXTRACTION     bilateral   CESAREAN SECTION N/A    [employer] 11/14/2020   COLONOSCOPY     EYE SURGERY N/A    [employer] 11/14/2020   SCLERAL BUCKLE     right   TONSILLECTOMY     TOTAL ABDOMINAL HYSTERECTOMY     TUBAL LIGATION     VITRECTOMY     bilateral   Patient Active Problem List   Diagnosis Date Noted   Primary osteoarthritis involving multiple joints 11/06/2023   Cervical radiculopathy 10/16/2023   Diabetes (HCC) 09/25/2021   Stage 3a chronic kidney disease (HCC) 11/08/2020    Retinal telangiectasia of right eye 05/18/2020   Retinal telangiectasia of left eye 05/18/2020   Cystoid macular edema of left eye 05/18/2020   Hyperuricemia 04/27/2020   Musculoskeletal pain 12/23/2018   Gastroesophageal reflux disease 11/23/2018   DDD (degenerative disc disease), cervical 07/11/2017   Localized osteoarthrosis of right shoulder region 07/11/2017   Anemia 07/11/2017   Hyperlipidemia 06/28/2016   Thyroid disease 06/28/2016   Diabetic retinopathy (HCC) 05/05/2015   Class 2 severe obesity due to excess calories with serious comorbidity and body mass index (BMI) of 38.0 to 38.9 in adult [facility]) 01/11/2015   Glaucoma 04/06/2014   Macular degeneration 04/06/2014   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, 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   Hypertension associated with diabetes (HCC) 10/02/2013   Sarcoidosis 10/02/2013   Dyslipidemia associated with type 2 diabetes mellitus (HCC) 10/02/2013   OSA (obstructive sleep apnea) 10/23/2012    PCP: Georgina Quint MD  REFERRING PROVIDER: Juanda Chance, NP  REFERRING DIAG: M54.2 (ICD-10-CM) - Cervicalgia M79.18 (ICD-10-CM) - Myofascial pain syndrome R20.2 (ICD-10-CM) - Paresthesia of skin  THERAPY DIAG:  Cervicalgia  Left arm pain  Abnormal posture  Rationale for Evaluation  and Treatment: Rehabilitation  ONSET DATE: 09/2023  SUBJECTIVE:                                                                                                                                                                                                         SUBJECTIVE STATEMENT: Pt indicated some soreness after last visit for a short period.  Pt indicated 2/10 symptoms at most.  Overall still doing better.  Pt indicated overall improvement to normal around 90-95%.   PERTINENT HISTORY:  DDD cervical, GERD, HTN, hyperlipidemia, DM  PAIN:  NPRS scale: up to 2/10 at worst.  Pain location: cervical pain, Lt arm symptoms  Pain description: constant, dull  achy pain Aggravating factors: househwork, sleeping,  Relieving factors: extra strength tylenol, heat.   PRECAUTIONS: None  WEIGHT BEARING RESTRICTIONS: No  FALLS:  Has patient fallen in last 6 months? No  LIVING ENVIRONMENT: Lives in: House/apartment  OCCUPATION: Retired  PLOF: Independent, Rt hand dominant. Housework, reading.    PATIENT GOALS: Reduce pain.   OBJECTIVE:   PATIENT SURVEYS:  12/25/2023: FOTO update:  66  11/21/2023 FOTO intake:  45  predicted:  55  COGNITION: 11/21/2023 Overall cognitive status: Within functional limits for tasks assessed  SENSATION: 11/21/2023 WFL  POSTURE:  11/21/2023 rounded shoulders and forward head  PALPATION: 11/21/2023 Trigger points, tightness in Lt upper trap, Lt cervical paraspinals, Lt infraspinatus.  Possible connection to Lt arm symptoms observed from Lt infraspinatus.    CERVICAL ROM:   ROM AROM (deg) 11/21/2023 AROM 12/17/2022  Flexion 52 with no symptoms change 50  Extension 35 c neck pain with no arm symptoms 50  Right lateral flexion    Left lateral flexion    Right rotation 65 c no changes 65  Left rotation 74 c no changes 65   (Blank rows = not tested)  UPPER EXTREMITY ROM:   ROM Right 11/21/2023 Left 11/21/2023  Shoulder flexion    Shoulder extension    Shoulder abduction    Shoulder adduction    Shoulder extension    Shoulder internal rotation    Shoulder external rotation    Elbow flexion    Elbow extension    Wrist flexion    Wrist extension    Wrist ulnar deviation    Wrist radial deviation    Wrist pronation    Wrist supination     (Blank rows = not tested)  UPPER EXTREMITY MMT:  MMT Right 11/21/2023 Left 11/21/2023  Shoulder flexion 5/5 5/5  Shoulder extension    Shoulder abduction 5/5 5/5  Shoulder  adduction    Shoulder extension    Shoulder internal rotation 5/5 5/5  Shoulder external rotation 5/5 5/5  Middle trapezius    Lower trapezius    Elbow flexion 5/5 5/5   Elbow extension 5/5 5/5  Wrist flexion    Wrist extension    Wrist ulnar deviation    Wrist radial deviation    Wrist pronation    Wrist supination    Grip strength     (Blank rows = not tested)  CERVICAL SPECIAL TESTS:  12/18/2023: Cervical distraction positive  11/21/2023 (-) spurlings compression  FUNCTIONAL TESTS:  11/21/2023 No specific testing.                                                                                                                                                                                  TODAY'S TREATMENT:                                                                                                       DATE: 01/02/2024 Therex: UBE UE only 4 mins fwd/back each with 1 min rest between directions.  Lvl 3.0  Standing green band ER walk outs 5 sec hold x 10 c towel under arm, performed bilaterally (additional verbal and visual cues) Tband rows bilateral 2 x 15 green   Tband GH ext bilateral 2 x 15 green   Mechanical Traction Supine cervical traction 10 mins with intermittent 15/10 lbs 60sec/20secs  TODAY'S TREATMENT:                                                                                                       DATE: 12/25/2023 Therex: UBE UE only 3 mins fwd/back each with 1 min rest between directions.  Lvl 2.5  Supine cervical retraction 5 sec hold x 10  Upper trap Lt self stretch  15 sec x 3  Standing green band ER walk outs 5 sec hold x 10 c towel under arm, performed bilaterally (additional verbal and visual cues) Tband rows bilateral 2 x 15 green   Tband GH ext bilateral 2 x 15 green  Cues for additions of HEP.   Mechanical Traction Supine cervical traction 10 mins with intermittent 15/10 lbs 60sec/20secs  TODAY'S TREATMENT:                                                                                                       DATE: 12/18/2023 Manual Supine cervical distraction intermittent with manual pull. Skilled palpation  with dry needling.   Trigger Point Dry Needling Subsequent Treatment: Instructions provided previously at initial dry needling treatment.  Patient Verbal Consent Given: Yes Education Handout Provided: Yes Muscles treated: Lt upper trap  Treatment response/outcome: local twitch response  Therex: Supine cervical retraction 5 sec hold x 10  Upper trap Lt self stretch 15 sec x 3  Tband rows bilateral 2 x 15 green   Tband GH ext bilateral 2 x 15 greenn  Mechanical Traction Supine cervical traction 10 mins with intermittent 15/10 lbs 60sec/20secs   TODAY'S TREATMENT:                                                                                                       DATE: 12/13/2023 Therex: UBE fwd/back 4 mins each way with 1 min rest break lvl 2.5  Tband rows green 2 x 15 Tband gh ext 2 x 15 Cross arm stretch 15 sec x 3 Lt arm    Manual Percussive device Lt upper trap, Lt infraspinatus   Trigger Point Dry Needling Initial Treatment: Pt instructed on Dry Needling rational, procedures, and possible side effects. Pt instructed to expect mild to moderate muscle soreness later in the day and/or into the next day.  Pt instructed in methods to reduce muscle soreness. Pt instructed to continue prescribed HEP. Because Dry Needling was performed over or adjacent to a lung field, pt was educated on S/S of pneumothorax and to seek immediate medical attention should they occur.  Patient was educated on signs and symptoms of infection and other risk factors and advised to seek medical attention should they occur.  Patient verbalized understanding of these instructions and education.  Patient Verbal Consent Given: Yes Education Handout Provided: Yes Muscles treated: Lt infraspinatus Treatment response/outcome: local twitch response with concordant symptoms in Lt arm.   PATIENT EDUCATION:  12/25/2023:  Education details: HEP update Person educated: Patient Education method: Advertising account executive, Verbal cues, and Handouts Education comprehension: verbalized understanding, returned demonstration, and verbal cues required  HOME  EXERCISE PROGRAM: Access Code: [email or web address] URL: [email or web address] Date: 12/25/2023 Prepared by: Chyrel Masson  Exercises - Seated Upper Trapezius Stretch (Mirrored)  - 2-3 x daily - 7 x weekly - 1 sets - 3-5 reps - 15 hold - Standing Shoulder Posterior Capsule Stretch  - 2-3 x daily - 7 x weekly - 1 sets - 3-5 reps - 15 hold - Seated Scapular Retraction  - 3-5 x daily - 7 x weekly - 1 sets - 5-10 reps - 3-5 hold - Cervical Retraction at Wall  - 2-3 x daily - 7 x weekly - 1 sets - 5-10 reps - 5 hold - Standing Isometric Shoulder External Rotation with Doorway  - 2-3 x daily - 7 x weekly - 1 sets - 5-10 reps - 5 hold - Standing Bilateral Low Shoulder Row with Anchored Resistance  - 1-2 x daily - 7 x weekly - 2-3 sets - 10-15 reps - Shoulder Extension with Resistance  - 1-2 x daily - 7 x weekly - 1-2 sets - 10-15 reps - Shoulder External Rotation Reactive Isometrics  - 1 x daily - 7 x weekly - 1 sets - 10 reps - 5-15 hold  ASSESSMENT:  CLINICAL IMPRESSION: Pt has made good progress overall, reporting 90-95% improvement to normal. Reduced severity of symptoms noted to this point.  Pt was in agreement with trial HEP period with return if necessary over the next 30 days.  Does have follow up with NP in next week.     OBJECTIVE IMPAIRMENTS: decreased activity tolerance, decreased coordination, decreased endurance, decreased mobility, decreased ROM, increased fascial restrictions, impaired perceived functional ability, increased muscle spasms, impaired flexibility, impaired UE functional use, improper body mechanics, postural dysfunction, and pain.   ACTIVITY LIMITATIONS: carrying, lifting, sitting, sleeping, and locomotion level  PARTICIPATION LIMITATIONS: meal prep, cleaning, laundry, interpersonal relationship, driving, shopping,  and community activity  PERSONAL FACTORS:  DDD cervical, GERD, HTN, hyperlipidemia, DM  are also affecting patient's functional outcome.   REHAB POTENTIAL: Good  CLINICAL DECISION MAKING: Stable/uncomplicated  EVALUATION COMPLEXITY: Low   GOALS: Goals reviewed with patient? Yes  SHORT TERM GOALS: (target date for Short term goals are 3 weeks 12/12/2023)  1.Patient will demonstrate independent use of home exercise program to maintain progress from in clinic treatments. Goal status: Met  LONG TERM GOALS: (target dates for all long term goals are 12 weeks  02/13/2024 )   1. Patient will demonstrate/report pain at worst less than or equal to 2/10 to facilitate minimal limitation in daily activity secondary to pain symptoms. Goal status: on going 01/02/2024   2. Patient will demonstrate independent use of home exercise program to facilitate ability to maintain/progress functional gains from skilled physical therapy services. Goal status: Met 01/02/2024   3. Patient will demonstrate FOTO outcome > or = 55 % to indicate reduced disability due to condition. Goal status: Met 01/02/2024   4.  Patient will demonstrate cervical AROM WFL s symptoms to facilitate usual head movements for daily activity including driving, self care.   Goal status: on going 01/02/2024   5.  Patient will demonstrate/report ability to sleep s restriction due to symptoms.   Goal status: on going 01/02/2024    PLAN:  PT FREQUENCY: 1-2x/week  PT DURATION: 12 weeks  Can include 11914- PT Re-evaluation, 97110-Therapeutic exercises, 97530- Therapeutic activity, 97112- Neuromuscular re-education, 97535- Self Care, 97140- Manual therapy, [id number]- Aquatic Therapy, 97014- Electrical stimulation (unattended), [phone]- Traction (mechanical)  Patient/Family education, Balance training, Stair training, Taping, Dry Needling, Joint  mobilization, Joint manipulation, Spinal manipulation, Spinal mobilization, Scar mobilization,  Vestibular training, Visual/preceptual remediation/compensation, DME instructions, Cryotherapy, and Moist heat.  All performed as medically necessary.  All included unless contraindicated  PLAN FOR NEXT SESSION: Trial HEP period.  Return PRN based off symptoms. Discharge after 30 days inactivity.    Chyrel Masson, PT, DPT, OCS, ATC 01/02/24  8:34 AM     Referring diagnosis? M54.2 (ICD-10-CM) - Cervicalgia M79.18 (ICD-10-CM) - Myofascial pain syndrome R20.2 (ICD-10-CM) - Paresthesia of skin Treatment diagnosis? (if different than referring diagnosis) M54.2 (ICD-10-CM) - Cervicalgia What was this (referring dx) caused by? []  Surgery []  Fall [x]  Ongoing issue []  Arthritis []  Other: ____________  Laterality: []  Rt []  Lt [x]  Both  Check all possible CPT codes:  *CHOOSE 10 OR LESS*    See Planned Interventions listed in the Plan section of the Evaluation.    PHYSICAL THERAPY DISCHARGE SUMMARY  Visits from Start of Care: 5  Current functional level related to goals / functional outcomes: See note   Remaining deficits: See note   Education / Equipment: HEP  Patient goals were  mostly met . Patient is being discharged due to not returning since the last visit.  Chyrel Masson, PT, DPT, OCS, ATC 03/17/24  1:42 PM

## 2024-01-03 ENCOUNTER — Other Ambulatory Visit: Payer: Self-pay | Admitting: Emergency Medicine

## 2024-01-03 DIAGNOSIS — E790 Hyperuricemia without signs of inflammatory arthritis and tophaceous disease: Secondary | ICD-10-CM

## 2024-01-07 ENCOUNTER — Ambulatory Visit: Payer: Medicare HMO | Admitting: Physical Medicine and Rehabilitation

## 2024-01-07 ENCOUNTER — Encounter: Payer: Self-pay | Admitting: Physical Medicine and Rehabilitation

## 2024-01-07 DIAGNOSIS — M542 Cervicalgia: Secondary | ICD-10-CM | POA: Diagnosis not present

## 2024-01-07 DIAGNOSIS — M7918 Myalgia, other site: Secondary | ICD-10-CM
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
## 2024-01-13 ENCOUNTER — Other Ambulatory Visit: Payer: Self-pay | Admitting: Physical Medicine and Rehabilitation

## 2024-01-21 DIAGNOSIS — H35372 Puckering of macula, left eye: Secondary | ICD-10-CM | POA: Diagnosis not present

## 2024-01-21 DIAGNOSIS — H35351 Cystoid macular degeneration, right eye: Secondary | ICD-10-CM | POA: Diagnosis not present

## 2024-01-21 DIAGNOSIS — H35073 Retinal telangiectasis, bilateral: Secondary | ICD-10-CM | POA: Diagnosis not present

## 2024-01-21 DIAGNOSIS — H3523 Other non-diabetic proliferative retinopathy, bilateral: Secondary | ICD-10-CM | POA: Diagnosis not present

## 2024-01-21 DIAGNOSIS — H401131 Primary open-angle glaucoma, bilateral, mild stage: Secondary | ICD-10-CM | POA: Diagnosis not present

## 2024-01-21 DIAGNOSIS — E113511 Type 2 diabetes mellitus with proliferative diabetic retinopathy with macular edema, right eye: Secondary | ICD-10-CM | POA: Diagnosis not present

## 2024-01-29 DIAGNOSIS — H47233 Glaucomatous optic atrophy, bilateral: Secondary | ICD-10-CM | POA: Diagnosis not present

## 2024-01-29 DIAGNOSIS — H35071 Retinal telangiectasis, right eye: Secondary | ICD-10-CM | POA: Diagnosis not present

## 2024-01-29 DIAGNOSIS — G4733 Obstructive sleep apnea (adult) (pediatric): Secondary | ICD-10-CM | POA: Diagnosis not present

## 2024-01-29 DIAGNOSIS — H35072 Retinal telangiectasis, left eye: Secondary | ICD-10-CM | POA: Diagnosis not present

## 2024-01-29 DIAGNOSIS — H401132 Primary open-angle glaucoma, bilateral, moderate stage: Secondary | ICD-10-CM | POA: Diagnosis not present

## 2024-02-12 ENCOUNTER — Other Ambulatory Visit: Payer: Self-pay | Admitting: Physical Medicine and Rehabilitation

## 2024-03-03 DIAGNOSIS — H35372 Puckering of macula, left eye: Secondary | ICD-10-CM | POA: Diagnosis not present

## 2024-03-03 DIAGNOSIS — Z961 Presence of intraocular lens: Secondary | ICD-10-CM | POA: Diagnosis not present

## 2024-03-03 DIAGNOSIS — H3523 Other non-diabetic proliferative retinopathy, bilateral: Secondary | ICD-10-CM | POA: Diagnosis not present

## 2024-03-03 DIAGNOSIS — H401131 Primary open-angle glaucoma, bilateral, mild stage: Secondary | ICD-10-CM | POA: Diagnosis not present

## 2024-03-03 DIAGNOSIS — E113511 Type 2 diabetes mellitus with proliferative diabetic retinopathy with macular edema, right eye: Secondary | ICD-10-CM | POA: Diagnosis not present

## 2024-03-03 DIAGNOSIS — H35351 Cystoid macular degeneration, right eye: Secondary | ICD-10-CM | POA: Diagnosis not present

## 2024-03-03 DIAGNOSIS — H35073 Retinal telangiectasis, bilateral: Secondary | ICD-10-CM | POA: Diagnosis not present

## 2024-03-03 LAB — HM DIABETES EYE EXAM

## 2024-03-31 ENCOUNTER — Ambulatory Visit (INDEPENDENT_AMBULATORY_CARE_PROVIDER_SITE_OTHER): Admitting: Family Medicine

## 2024-03-31 ENCOUNTER — Ambulatory Visit (INDEPENDENT_AMBULATORY_CARE_PROVIDER_SITE_OTHER): Payer: Medicare HMO

## 2024-03-31 ENCOUNTER — Encounter: Payer: Self-pay | Admitting: Family Medicine

## 2024-03-31 VITALS — BP 142/82 | HR 82 | Temp 98.3°F | Resp 18 | Ht 63.0 in | Wt 219.0 lb

## 2024-03-31 VITALS — Ht 63.0 in | Wt 216.0 lb

## 2024-03-31 DIAGNOSIS — Z0000 Encounter for general adult medical examination without abnormal findings: Secondary | ICD-10-CM | POA: Diagnosis not present

## 2024-03-31 DIAGNOSIS — L309 Dermatitis, unspecified: Secondary | ICD-10-CM | POA: Diagnosis not present

## 2024-03-31 MED ORDER — LEVOCETIRIZINE DIHYDROCHLORIDE 5 MG PO TABS: 5.0000 mg | ORAL_TABLET | Freq: Every evening | ORAL | 0 refills | Status: AC

## 2024-03-31 MED ORDER — PREDNISONE 10 MG (21) PO TBPK: ORAL_TABLET | ORAL | 0 refills | Status: DC

## 2024-03-31 MED ORDER — METHYLPREDNISOLONE ACETATE 80 MG/ML IJ SUSP
80.0000 mg | Freq: Once | INTRAMUSCULAR | Status: AC
  Administered 2024-03-31: 80 mg via INTRAMUSCULAR

## 2024-03-31 NOTE — Patient Instructions (Signed)
 [name] , Thank you for taking time to come for your Medicare Wellness Visit. I appreciate your ongoing commitment to your health goals. Please review the following plan we discussed and let me know if I can assist you in the future.   Referrals/Orders/Follow-Ups/Clinician Recommendations: Aim for 30 minutes of exercise or brisk walking, 6-8 glasses of water, and 5 servings of fruits and vegetables each day. Educated and advised on getting the Shingles vaccine in 2025.  This is a list of the screening recommended for you and due dates:  Health Maintenance  Topic Date Due   Zoster (Shingles) Vaccine (1 of 2) Never done   Complete foot exam   04/26/2021   COVID-19 Vaccine (8 - 2024-25 season) 03/05/2024   Hemoglobin A1C  06/23/2024   Flu Shot  07/11/2024   Yearly kidney function blood test for diabetes  09/23/2024   Yearly kidney health urinalysis for diabetes  09/23/2024   Mammogram  12/13/2024   DTaP/Tdap/Td vaccine (2 - Td or Tdap) 01/11/2025   Eye exam for diabetics  03/03/2025   Medicare Annual Wellness Visit  03/31/2025   Colon Cancer Screening  12/23/2031   Pneumonia Vaccine  Completed   DEXA scan (bone density measurement)  Completed   Hepatitis C Screening  Completed   HPV Vaccine  Aged Out   Meningitis B Vaccine  Aged Out    Advanced directives: (Provided) Advance directive discussed with you today. I have provided a copy for you to complete at home and have notarized. Once this is complete, please bring a copy in to our office so we can scan it into your chart.   Next Medicare Annual Wellness Visit scheduled for next year: Yes

## 2024-03-31 NOTE — Progress Notes (Signed)
 Subjective:   Eileen Jefferson is a 73 y.o. who presents for a Medicare Wellness preventive visit.  Visit Complete: Virtual I connected with  Annett Bart on 03/31/24 by a audio enabled telemedicine application and verified that I am speaking with the correct person using two identifiers.  Patient Location: Home  Provider Location: Office/Clinic  I discussed the limitations of evaluation and management by telemedicine. The patient expressed understanding and agreed to proceed.  Vital Signs: Because this visit was a virtual/telehealth visit, some criteria may be missing or patient reported. Any vitals not documented were not able to be obtained and vitals that have been documented are patient reported.  VideoDeclined- This patient declined Librarian, academic. Therefore the visit was completed with audio only.  Persons Participating in Visit: Patient.  AWV Questionnaire: No: Patient Medicare AWV questionnaire was not completed prior to this visit.  Cardiac Risk Factors include: advanced age (>85men, >18 women);diabetes mellitus;dyslipidemia;hypertension;obesity (BMI >30kg/m2)     Objective:    Today's Vitals   03/31/24 0813  Weight: 216 lb (98 kg)  Height: 5\' 3"  (1.6 m)   Body mass index is 38.26 kg/m.     03/31/2024    8:11 AM 11/21/2023    1:34 PM 03/27/2023    8:50 AM 03/24/2022    8:21 AM 11/15/2020    9:02 AM 08/16/2018    6:38 AM  Advanced Directives  Does Patient Have a Medical Advance Directive? No No No No No No  Would patient like information on creating a medical advance directive? Yes (MAU/Ambulatory/Procedural Areas - Information given) Yes (MAU/Ambulatory/Procedural Areas - Information given) No - Patient declined No - Patient declined No - Patient declined Yes (ED - Information included in AVS)    Current Medications (verified) Outpatient Encounter Medications as of 03/31/2024  Medication Sig   ACCU-CHEK AVIVA PLUS  test strip TEST BLOOD SUGAR ONE TIME DAILY   Accu-Chek Softclix Lancets lancets TEST BLOOD SUGAR ONE TIME DAILY   Alcohol  Swabs  (DROPSAFE ALCOHOL  PREP) 70 % PADS USE TO BLOOD SUGAR EVERY DAY   allopurinol  (ZYLOPRIM ) 100 MG tablet TAKE 1 TABLET EVERY DAY   aspirin EC 81 MG tablet Take 81 mg by mouth daily.   Blood Glucose Calibration (ACCU-CHEK AVIVA) SOLN Test blood sugar once daily. Dx: E11.9   Blood Glucose Monitoring Suppl (ACCU-CHEK AVIVA PLUS) w/Device KIT Test blood sugar once daily. Dx: E11.9   carboxymethylcellulose (REFRESH PLUS) 0.5 % SOLN 1 drop daily as needed.   colchicine  0.6 MG tablet Take 1 tablet (0.6 mg total) by mouth daily as needed (gout or psuedogout pain).   empagliflozin  (JARDIANCE ) 10 MG TABS tablet Take 1 tablet (10 mg total) by mouth daily before breakfast.   famotidine  (PEPCID ) 40 MG tablet TAKE 1 TABLET (40 MG TOTAL) BY MOUTH 2  TIMES DAILY AS NEEDED FOR HEARTBURN OR INDIGESTION.   Latanoprost PF (IYUZEH) 0.005 % SOLN Apply to eye at bedtime. Drop in each eye at bedtime   omeprazole  (PRILOSEC) 40 MG capsule TAKE 1 CAPSULE EVERY DAY 30 TO 60 MINUTES BEFORE BREAKFAST   rosuvastatin  (CRESTOR ) 40 MG tablet TAKE 1 TABLET EVERY DAY   Semaglutide  (RYBELSUS ) 7 MG TABS Take 1 tablet (7 mg total) by mouth daily.   valsartan  (DIOVAN ) 80 MG tablet TAKE 1 TABLET EVERY DAY   [DISCONTINUED] baclofen  (LIORESAL ) 10 MG tablet Take 1 tablet (10 mg total) by mouth 3 (three) times daily.   [DISCONTINUED] meloxicam  (MOBIC ) 15 MG tablet TAKE 1  TABLET (15 MG TOTAL) BY MOUTH DAILY.   No facility-administered encounter medications on file as of 03/31/2024.    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)    Past Surgical History:  Procedure Laterality Date   ABDOMINAL HYSTERECTOMY N/A    [facility] 11/14/2020   APPENDECTOMY     c-section     x 2   CATARACT EXTRACTION     bilateral   CESAREAN SECTION N/A    [facility] 11/14/2020   COLONOSCOPY     EYE SURGERY N/A    [facility] 11/14/2020   SCLERAL BUCKLE     right   TONSILLECTOMY     TOTAL ABDOMINAL HYSTERECTOMY     TUBAL LIGATION     VITRECTOMY     bilateral   Family History  Problem Relation Age of Onset   Colon polyps Mother        brothers x2   Clotting disorder Mother        brother x 2, MGM   Heart disease Brother        mother, MGM   Diabetes Brother        x 2, MGM   Stroke Maternal Grandmother    Colon cancer Neg Hx    Breast cancer Neg Hx    Esophageal cancer Neg Hx    Rectal cancer Neg Hx    Stomach cancer Neg Hx    Social History   Socioeconomic History   Marital status: Married    Spouse name: Not on file   Number of children: 2   Years of education: Not on file   Highest education level: Bachelor's degree (e.g., BA, AB, BS)  Occupational History   Occupation: behavioral health  Tobacco Use   Smoking status: Former    Current packs/day: 0.00    Average packs/day: 0.2 packs/day for 1 year (0.2 ttl pk-yrs)    Types: Cigarettes    Start date: 12/12/1967    Quit date: 12/11/1968    Years since quitting: 55.3    Passive exposure: Past   Smokeless tobacco: Never  Vaping Use   Vaping status: Never Used  Substance and Sexual Activity   Alcohol  use: No   Drug use: No   Sexual activity: Not on file  Other Topics Concern   Not on file  Social History Narrative   Married   Social Drivers of  Health   Financial Resource Strain: Low Risk  (03/31/2024)   Overall Financial Resource Strain (CARDIA)    Difficulty of Paying Living Expenses: Not hard at all  Food Insecurity: No Food Insecurity (03/31/2024)   Hunger Vital Sign    Worried About Running Out of Food in the Last Year: Never true    Ran Out of Food in the Last Year: Never true  Transportation Needs: No Transportation Needs (03/31/2024)   PRAPARE - Transportation  Lack of Transportation (Medical): No    Lack of Transportation (Non-Medical): No  Physical Activity: Insufficiently Active (03/31/2024)   Exercise Vital Sign    Days of Exercise per Week: 1 day    Minutes of Exercise per Session: 20 min  Stress: No Stress Concern Present (03/31/2024)   [employer] of Occupational Health - Occupational Stress Questionnaire    Feeling of Stress : Not at all  Social Connections: Socially Integrated (03/31/2024)   Social Connection and Isolation Panel [NHANES]    Frequency of Communication with Friends and Family: More than three times a week    Frequency of Social Gatherings with Friends and Family: More than three times a week    Attends Religious Services: More than 4 times per year    Active Member of [employer] or Organizations: Yes    Attends Engineer, structural: More than 4 times per year    Marital Status: Married    Tobacco Counseling Counseling given: No    Clinical Intake:  Pre-visit preparation completed: Yes  Pain : No/denies pain     BMI - recorded: 38.26 Nutritional Status: BMI > 30  Obese Nutritional Risks: None Diabetes: Yes CBG done?: No Did pt. bring in CBG monitor from home?: No  Lab Results  Component Value Date   HGBA1C 6.2 (A) 12/25/2023   HGBA1C 6.2 (A) 09/24/2023   HGBA1C 6.7 (H) 03/14/2022     How often do you need to have someone help you when you read instructions, pamphlets, or other written materials from your doctor or pharmacy?: 1 - Never  Interpreter Needed?:  No  Information entered by :: Kandy Orris, CMA   Activities of Daily Living     03/31/2024    8:17 AM  In your present state of health, do you have any difficulty performing the following activities:  Hearing? 0  Vision? 0  Difficulty concentrating or making decisions? 0  Walking or climbing stairs? 0  Dressing or bathing? 0  Doing errands, shopping? 0  Preparing Food and eating ? N  Using the Toilet? N  In the past six months, have you accidently leaked urine? N  Do you have problems with loss of bowel control? N  Managing your Medications? N  Managing your Finances? N  Housekeeping or managing your Housekeeping? N    Patient Care Team: Elvira Hammersmith, MD as PCP - General (Internal Medicine) Wilder Handy, MD (Inactive) as Referring Physician (Pulmonary Disease) Ben Bracken, MD as Consulting Physician (Ophthalmology) Tobin Forts, MD as Consulting Physician (Gastroenterology) Seward Dao Alleen Arbour, MD as Consulting Physician (Ophthalmology)  Indicate any recent Medical Services you may have received from other than [facility] providers in the past year (date may be approximate).     Assessment:   This is a routine wellness examination for [place].  Hearing/Vision screen Hearing Screening - Comments:: Denies hearing difficulties   Vision Screening - Comments:: Wears rx glasses - up to date with routine eye exams with Dr Gwendalyn Lemma and Dr Seward Dao   Goals Addressed               This Visit's Progress     Patient Stated (pt-stated)        Patient stated she plans to walk more.         Depression Screen     03/31/2024    8:21 AM 12/25/2023    9:15 AM 11/06/2023    9:14 AM 10/16/2023    2:27 PM 09/24/2023  10:13 AM 03/27/2023    8:55 AM 05/30/2022    3:07 PM  PHQ 2/9 Scores  PHQ - 2 Score 0 0 0 0 0 0 0  PHQ- 9 Score 0 0 1    2    Fall Risk     03/31/2024    8:18 AM 12/25/2023    9:15 AM 11/06/2023    9:10 AM 10/16/2023    2:27 PM 09/24/2023   10:13 AM   Fall Risk   Falls in the past year? 0 0 0 0 0  Number falls in past yr: 0 0 0 0 0  Injury with Fall? 0 0 0 0 0  Risk for fall due to : No Fall Risks No Fall Risks No Fall Risks No Fall Risks No Fall Risks  Follow up Falls prevention discussed;Falls evaluation completed Falls evaluation completed Falls evaluation completed Falls evaluation completed Falls evaluation completed    MEDICARE RISK AT HOME:  Medicare Risk at Home Any stairs in or around the home?: No If so, are there any without handrails?: No Home free of loose throw rugs in walkways, pet beds, electrical cords, etc?: Yes Adequate lighting in your home to reduce risk of falls?: Yes Life alert?: No Use of a cane, walker or w/c?: No Grab bars in the bathroom?: Yes Shower chair or bench in shower?: No Elevated toilet seat or a handicapped toilet?: Yes  TIMED UP AND GO:  Was the test performed?  No  Cognitive Function: 6CIT completed        03/31/2024    8:18 AM 03/27/2023    8:53 AM 11/15/2020    9:03 AM  6CIT Screen  What Year? 0 points 0 points 0 points  What month? 0 points 0 points 0 points  What time? 0 points 0 points 0 points  Count back from 20 0 points 0 points 0 points  Months in reverse 0 points 0 points 0 points  Repeat phrase 0 points 0 points 0 points  Total Score 0 points 0 points 0 points    Immunizations Immunization History  Administered Date(s) Administered   Fluad Quad(high Dose 65+) 09/22/2019, 09/18/2022, 09/10/2023   Influenza, High Dose Seasonal PF 11/23/2018   Influenza,inj,Quad PF,6+ Mos 10/02/2013, 01/11/2015, 01/20/2016, 01/11/2017, 02/13/2018   Influenza-Unspecified 09/19/2020, 09/06/2021   PFIZER(Purple Top)SARS-COV-2 Vaccination 01/02/2020, 01/23/2020, 09/19/2020, 02/17/2021   Pfizer Covid-19 Vaccine Bivalent Booster 79yrs & up 09/06/2021   Pfizer(Comirnaty)Fall Seasonal Vaccine 12 years and older 09/18/2022   Pneumococcal Conjugate-13 11/23/2018   Pneumococcal  Polysaccharide-23 10/02/2013, 04/26/2020   Tdap 01/11/2015   Unspecified SARS-COV-2 Vaccination 09/06/2023    Screening Tests Health Maintenance  Topic Date Due   Zoster Vaccines- Shingrix (1 of 2) Never done   FOOT EXAM  04/26/2021   COVID-19 Vaccine (8 - 2024-25 season) 03/05/2024   HEMOGLOBIN A1C  06/23/2024   INFLUENZA VACCINE  07/11/2024   Diabetic kidney evaluation - eGFR measurement  09/23/2024   Diabetic kidney evaluation - Urine ACR  09/23/2024   MAMMOGRAM  12/13/2024   DTaP/Tdap/Td (2 - Td or Tdap) 01/11/2025   OPHTHALMOLOGY EXAM  03/03/2025   Medicare Annual Wellness (AWV)  03/31/2025   Colonoscopy  12/23/2031   Pneumonia Vaccine 101+ Years old  Completed   DEXA SCAN  Completed   Hepatitis C Screening  Completed   HPV VACCINES  Aged Out   Meningococcal B Vaccine  Aged Out    Health Maintenance  Health Maintenance Due  Topic Date Due  Zoster Vaccines- Shingrix (1 of 2) Never done   FOOT EXAM  04/26/2021   COVID-19 Vaccine (8 - 2024-25 season) 03/05/2024   Health Maintenance Items Addressed:03/31/2024   Additional Screening:  Vision Screening: Recommended annual ophthalmology exams for early detection of glaucoma and other disorders of the eye.  Dental Screening: Recommended annual dental exams for proper oral hygiene  Community Resource Referral / Chronic Care Management: CRR required this visit?  No   CCM required this visit?  No     Plan:     I have personally reviewed and noted the following in the patient's chart:   Medical and social history Use of alcohol , tobacco or illicit drugs  Current medications and supplements including opioid prescriptions. Patient is not currently taking opioid prescriptions. Functional ability and status Nutritional status Physical activity Advanced directives List of other physicians Hospitalizations, surgeries, and ER visits in previous 12 months Vitals Screenings to include cognitive, depression, and  falls Referrals and appointments  In addition, I have reviewed and discussed with patient certain preventive protocols, quality metrics, and best practice recommendations. A written personalized care plan for preventive services as well as general preventive health recommendations were provided to patient.     Patria Bookbinder, CMA   03/31/2024   After Visit Summary: (Mail) Due to this being a telephonic visit, the after visit summary with patients personalized plan was offered to patient via mail   Notes: Nothing significant to report at this time.

## 2024-03-31 NOTE — Patient Instructions (Signed)
 Start oral steroids tomorrow.  Do not take Xyzal  with your Claritin.   Cool compresses/showers.

## 2024-03-31 NOTE — Progress Notes (Signed)
 Assessment & Plan:  1. Dermatitis (Primary) Education provided on rashes. She will start oral steroids tomorrow. Advised not to take Xyzal  and Claritin together as she does have PRN Claritin at home. Recommended cool compresses and/or cooler showers.  - methylPREDNISolone  acetate (DEPO-MEDROL ) injection 80 mg - predniSONE  (STERAPRED UNI-PAK 21 TAB) 10 MG (21) TBPK tablet; As directed x 6 days starting tomorrow.  Dispense: 21 tablet; Refill: 0 - levocetirizine (XYZAL ) 5 MG tablet; Take 1 tablet (5 mg total) by mouth every evening.  Dispense: 30 tablet; Refill: 0   Follow up plan: Return if symptoms worsen or fail to improve.  Hershel Los, MSN, APRN, FNP-C  Subjective:  HPI: Eileen Jefferson is a 73 y.o. female presenting on 03/31/2024 for Rash (All over - blister-like rash - first noticed Friday. Unknown of any reason for rash )  Patient reports a rash that started three days ago. Started on both arms and has spread all over. It does itch. She has been applying Ivarest which has not helped. Denies any new medications, foods, shampoo, conditioner, laundry detergent, fabric softeners, lotions, and body wash. She has not been exposed to plants outdoors or to pets. She did visit her sister and reports secondhand some exposure.    ROS: Negative unless specifically indicated above in HPI.   Relevant past medical history reviewed and updated as indicated.   Allergies and medications reviewed and updated.   Current Outpatient Medications:    ACCU-CHEK AVIVA PLUS test strip, TEST BLOOD SUGAR ONE TIME DAILY, Disp: 100 strip, Rfl: 3   Accu-Chek Softclix Lancets lancets, TEST BLOOD SUGAR ONE TIME DAILY, Disp: 100 each, Rfl: 3   Alcohol  Swabs  (DROPSAFE ALCOHOL  PREP) 70 % PADS, USE TO BLOOD SUGAR EVERY DAY, Disp: 100 each, Rfl: 10   allopurinol  (ZYLOPRIM ) 100 MG tablet, TAKE 1 TABLET EVERY DAY, Disp: 90 tablet, Rfl: 3   aspirin EC 81 MG tablet, Take 81 mg by mouth daily., Disp: , Rfl:     Blood Glucose Calibration (ACCU-CHEK AVIVA) SOLN, Test blood sugar once daily. Dx: E11.9, Disp: 1 each, Rfl: 2   Blood Glucose Monitoring Suppl (ACCU-CHEK AVIVA PLUS) w/Device KIT, Test blood sugar once daily. Dx: E11.9, Disp: 1 kit, Rfl: 0   carboxymethylcellulose (REFRESH PLUS) 0.5 % SOLN, 1 drop daily as needed., Disp: , Rfl:    colchicine  0.6 MG tablet, Take 1 tablet (0.6 mg total) by mouth daily as needed (gout or psuedogout pain)., Disp: 30 tablet, Rfl: 2   famotidine  (PEPCID ) 40 MG tablet, TAKE 1 TABLET (40 MG TOTAL) BY MOUTH 2  TIMES DAILY AS NEEDED FOR HEARTBURN OR INDIGESTION., Disp: 180 tablet, Rfl: 2   Latanoprost PF (IYUZEH) 0.005 % SOLN, Apply to eye at bedtime. Drop in each eye at bedtime, Disp: , Rfl:    NON FORMULARY, Eye injections - Dr Seward Dao - 03/03/24 done, sch'd next one is 04/03/24., Disp: , Rfl:    omeprazole  (PRILOSEC) 40 MG capsule, TAKE 1 CAPSULE EVERY DAY 30 TO 60 MINUTES BEFORE BREAKFAST, Disp: 90 capsule, Rfl: 3   rosuvastatin  (CRESTOR ) 40 MG tablet, TAKE 1 TABLET EVERY DAY, Disp: 90 tablet, Rfl: 3   Semaglutide  (RYBELSUS ) 7 MG TABS, Take 1 tablet (7 mg total) by mouth daily., Disp: 90 tablet, Rfl: 3   valsartan  (DIOVAN ) 80 MG tablet, TAKE 1 TABLET EVERY DAY, Disp: 90 tablet, Rfl: 3   empagliflozin  (JARDIANCE ) 10 MG TABS tablet, Take 1 tablet (10 mg total) by mouth daily before breakfast. (Patient not taking:  Reported on 03/31/2024), Disp: 90 tablet, Rfl: 3  Allergies  Allergen Reactions   Other     Symbrinza - eyes red and runny    Contrast Media [Iodinated Contrast Media]    Iohexol Other (See Comments)    " made me feel like I was burning inside"    Objective:   BP (!) 142/82   Pulse 82   Temp 98.3 F (36.8 C)   Resp 18   Ht 5\' 3"  (1.6 m)   Wt 219 lb (99.3 kg)   BMI 38.79 kg/m    Physical Exam Vitals reviewed.  Constitutional:      General: She is not in acute distress.    Appearance: Normal appearance. She is not ill-appearing, toxic-appearing or  diaphoretic.  HENT:     Head: Normocephalic and atraumatic.  Eyes:     General: No scleral icterus.       Right eye: No discharge.        Left eye: No discharge.     Conjunctiva/sclera: Conjunctivae normal.  Cardiovascular:     Rate and Rhythm: Normal rate.  Pulmonary:     Effort: Pulmonary effort is normal. No respiratory distress.  Musculoskeletal:        General: Normal range of motion.     Cervical back: Normal range of motion.  Skin:    General: Skin is warm and dry.     Capillary Refill: Capillary refill takes less than 2 seconds.     Findings: Rash (no drainage or surrounding erythema) present. Rash is pustular and urticarial.  Neurological:     General: No focal deficit present.     Mental Status: She is alert and oriented to person, place, and time. Mental status is at baseline.  Psychiatric:        Mood and Affect: Mood normal.        Behavior: Behavior normal.        Thought Content: Thought content normal.        Judgment: Judgment normal.

## 2024-04-03 DIAGNOSIS — H35351 Cystoid macular degeneration, right eye: Secondary | ICD-10-CM | POA: Diagnosis not present

## 2024-04-03 DIAGNOSIS — G4733 Obstructive sleep apnea (adult) (pediatric): Secondary | ICD-10-CM | POA: Diagnosis not present

## 2024-04-03 DIAGNOSIS — H35372 Puckering of macula, left eye: Secondary | ICD-10-CM | POA: Diagnosis not present

## 2024-04-03 DIAGNOSIS — H35073 Retinal telangiectasis, bilateral: Secondary | ICD-10-CM | POA: Diagnosis not present

## 2024-04-03 DIAGNOSIS — H3523 Other non-diabetic proliferative retinopathy, bilateral: Secondary | ICD-10-CM | POA: Diagnosis not present

## 2024-04-03 DIAGNOSIS — E113511 Type 2 diabetes mellitus with proliferative diabetic retinopathy with macular edema, right eye: Secondary | ICD-10-CM | POA: Diagnosis not present

## 2024-04-03 DIAGNOSIS — H401131 Primary open-angle glaucoma, bilateral, mild stage: Secondary | ICD-10-CM | POA: Diagnosis not present

## 2024-04-23 ENCOUNTER — Ambulatory Visit: Admitting: Emergency Medicine

## 2024-04-28 ENCOUNTER — Encounter: Payer: Self-pay | Admitting: Emergency Medicine

## 2024-04-28 ENCOUNTER — Ambulatory Visit: Admitting: Emergency Medicine

## 2024-04-28 VITALS — BP 134/82 | HR 78 | Temp 98.6°F | Ht 63.0 in | Wt 215.0 lb

## 2024-04-28 DIAGNOSIS — E11319 Type 2 diabetes mellitus with unspecified diabetic retinopathy without macular edema: Secondary | ICD-10-CM

## 2024-04-28 DIAGNOSIS — I152 Hypertension secondary to endocrine disorders: Secondary | ICD-10-CM | POA: Diagnosis not present

## 2024-04-28 DIAGNOSIS — E785 Hyperlipidemia, unspecified: Secondary | ICD-10-CM | POA: Diagnosis not present

## 2024-04-28 DIAGNOSIS — E1159 Type 2 diabetes mellitus with other circulatory complications: Secondary | ICD-10-CM | POA: Diagnosis not present

## 2024-04-28 DIAGNOSIS — N1831 Chronic kidney disease, stage 3a: Secondary | ICD-10-CM | POA: Diagnosis not present

## 2024-04-28 DIAGNOSIS — Z7984 Long term (current) use of oral hypoglycemic drugs: Secondary | ICD-10-CM | POA: Diagnosis not present

## 2024-04-28 DIAGNOSIS — M150 Primary generalized (osteo)arthritis: Secondary | ICD-10-CM | POA: Diagnosis not present

## 2024-04-28 DIAGNOSIS — E1169 Type 2 diabetes mellitus with other specified complication: Secondary | ICD-10-CM

## 2024-04-28 LAB — POCT GLYCOSYLATED HEMOGLOBIN (HGB A1C): Hemoglobin A1C: 6.5 % — AB (ref 4.0–5.6)

## 2024-04-28 MED ORDER — DAPAGLIFLOZIN PROPANEDIOL 10 MG PO TABS
10.0000 mg | ORAL_TABLET | Freq: Every day | ORAL | 3 refills | Status: AC
Start: 2024-04-28 — End: ?

## 2024-04-28 MED ORDER — DAPAGLIFLOZIN PROPANEDIOL 10 MG PO TABS: 10.0000 mg | ORAL_TABLET | Freq: Every day | ORAL | 3 refills | Status: DC

## 2024-04-28 NOTE — Assessment & Plan Note (Signed)
 Getting intraocular injections on a regular basis.

## 2024-04-28 NOTE — Assessment & Plan Note (Signed)
 Well-controlled pain Advised to avoid NSAIDs as much as possible Tylenol for pain as needed

## 2024-04-28 NOTE — Assessment & Plan Note (Addendum)
 BP Readings from Last 3 Encounters:  04/28/24 134/82  03/31/24 (!) 142/82  12/25/23 (!) 136/90  Continue valsartan  80 mg daily Well-controlled diabetes with hemoglobin A1c of 6.5 Intolerant to Rybelsus  due to side effects Cardiovascular risks associated with hypertension and diabetes discussed Diet and nutrition discussed Prefers to be on Farxiga .  Will send in a prescription to pharmacy of record today

## 2024-04-28 NOTE — Progress Notes (Signed)
 Eileen Jefferson 73 y.o.   Chief Complaint  Patient presents with   Follow-up    4 month f/u for HTN / DM. Patient mentions the jardiance  was too expensive and continued with her rybelsus  and was to know if she can just go back to farxiga .    HISTORY OF PRESENT ILLNESS: This is a 73 y.o. female here for 59-month follow-up of diabetes and hypertension and dyslipidemia Overall doing well.  However cost of medication is challenging No other complaints or medical concerns today.  HPI   Prior to Admission medications   Medication Sig Start Date End Date Taking? Authorizing Provider  ACCU-CHEK AVIVA PLUS test strip TEST BLOOD SUGAR ONE TIME DAILY 10/20/23  Yes Elleana Stillson, Isidro Margo, MD  Accu-Chek Softclix Lancets lancets TEST BLOOD SUGAR ONE TIME DAILY 10/20/23  Yes Elvira Hammersmith, MD  Alcohol  Swabs  (DROPSAFE ALCOHOL  PREP) 70 % PADS USE TO BLOOD SUGAR EVERY DAY 10/19/22  Yes Anevay Campanella Jose, MD  allopurinol  (ZYLOPRIM ) 100 MG tablet TAKE 1 TABLET EVERY DAY 01/03/24  Yes Manilla Strieter Jose, MD  aspirin EC 81 MG tablet Take 81 mg by mouth daily.   Yes [provider]  Blood Glucose Calibration (ACCU-CHEK AVIVA) SOLN Test blood sugar once daily. Dx: E11.9 10/25/16  Yes Cranston Dk, MD  Blood Glucose Monitoring Suppl (ACCU-CHEK AVIVA PLUS) w/Device KIT Test blood sugar once daily. Dx: E11.9 10/25/16  Yes Cranston Dk, MD  carboxymethylcellulose (REFRESH PLUS) 0.5 % SOLN 1 drop daily as needed.   Yes [provider]  colchicine  0.6 MG tablet Take 1 tablet (0.6 mg total) by mouth daily as needed (gout or psuedogout pain). 10/18/21  Yes Syliva Even, MD  famotidine  (PEPCID ) 40 MG tablet TAKE 1 TABLET (40 MG TOTAL) BY MOUTH 2  TIMES DAILY AS NEEDED FOR HEARTBURN OR INDIGESTION. 12/25/23  Yes Tallis Soledad, Isidro Margo, MD  Latanoprost PF (IYUZEH) 0.005 % SOLN Apply to eye at bedtime. Drop in each eye at bedtime   Yes [provider]  levocetirizine (XYZAL ) 5 MG  tablet Take 1 tablet (5 mg total) by mouth every evening. 03/31/24  Yes Zorita Hiss, FNP  NON FORMULARY Eye injections - Dr Seward Dao - 03/03/24 done, sch'd next one is 04/03/24.   Yes [provider]  omeprazole  (PRILOSEC) 40 MG capsule TAKE 1 CAPSULE EVERY DAY 30 TO 60 MINUTES BEFORE BREAKFAST 11/07/23  Yes Charlesetta Milliron, Isidro Margo, MD  rosuvastatin  (CRESTOR ) 40 MG tablet TAKE 1 TABLET EVERY DAY 12/08/23  Yes Mac Dowdell, Isidro Margo, MD  Semaglutide  (RYBELSUS ) 7 MG TABS Take 1 tablet (7 mg total) by mouth daily. 09/24/23  Yes Elvira Hammersmith, MD  valsartan  (DIOVAN ) 80 MG tablet TAKE 1 TABLET EVERY DAY 10/20/23  Yes Nereida Schepp Jose, MD  empagliflozin  (JARDIANCE ) 10 MG TABS tablet Take 1 tablet (10 mg total) by mouth daily before breakfast. Patient not taking: Reported on 04/28/2024 12/25/23   Donnae Michels Jose, MD    Allergies  Allergen Reactions   Other     Symbrinza - eyes red and runny    Contrast Media [Iodinated Contrast Media]    Iohexol Other (See Comments)    " made me feel like I was burning inside"    Patient Active Problem List   Diagnosis Date Noted   Primary osteoarthritis involving multiple joints 11/06/2023   Cervical radiculopathy 10/16/2023   Diabetes (HCC) 09/25/2021   Stage 3a chronic kidney disease (HCC) 11/08/2020   Retinal telangiectasia of right  eye 05/18/2020   Retinal telangiectasia of left eye 05/18/2020   Cystoid macular edema of left eye 05/18/2020   Hyperuricemia 04/27/2020   Musculoskeletal pain 12/23/2018   Gastroesophageal reflux disease 11/23/2018   DDD (degenerative disc disease), cervical 07/11/2017   Localized osteoarthrosis of right shoulder region 07/11/2017   Anemia 07/11/2017   Hyperlipidemia 06/28/2016   Thyroid  disease 06/28/2016   Diabetic retinopathy (HCC) 05/05/2015   Class 2 severe obesity due to excess calories with serious comorbidity and body mass index (BMI) of 38.0 to 38.9 in adult [facility]) 01/11/2015   Glaucoma  04/06/2014   Macular degeneration 04/06/2014   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, 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   Hypertension associated with diabetes (HCC) 10/02/2013   Sarcoidosis 10/02/2013   Dyslipidemia associated with type 2 diabetes mellitus (HCC) 10/02/2013   OSA (obstructive sleep apnea) 10/23/2012    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   Iron deficiency anemia    Pulmonary sarcoidosis (HCC)    Sleep apnea    uses CPAP   Thyroid  disease    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)     Past Surgical History:  Procedure Laterality Date   ABDOMINAL HYSTERECTOMY N/A    [facility] 11/14/2020   APPENDECTOMY     c-section     x 2   CATARACT EXTRACTION     bilateral   CESAREAN SECTION N/A    [facility] 11/14/2020   COLONOSCOPY     EYE SURGERY N/A    [facility] 11/14/2020   SCLERAL BUCKLE     right   TONSILLECTOMY     TOTAL ABDOMINAL HYSTERECTOMY     TUBAL LIGATION     VITRECTOMY     bilateral    Social History   Socioeconomic History   Marital status: Married    Spouse name: Not on file   Number of children: 2   Years of education: Not on file   Highest education level: Bachelor's degree (e.g., BA, AB, BS)  Occupational History   Occupation: behavioral health  Tobacco Use   Smoking status: Former    Current packs/day: 0.00    Average packs/day: 0.2 packs/day for 1 year (0.2 ttl pk-yrs)    Types: Cigarettes    Start date: 12/12/1967    Quit date: 12/11/1968    Years since quitting: 55.4    Passive exposure: Past   Smokeless tobacco: Never  Vaping Use   Vaping status: Never Used  Substance and Sexual  Activity   Alcohol  use: No   Drug use: No   Sexual activity: Not on file  Other Topics Concern   Not on file  Social History Narrative   Married   Social Drivers of Health   Financial Resource Strain: Low Risk  (03/31/2024)   Overall Financial Resource Strain (CARDIA)    Difficulty of Paying Living Expenses: Not hard at all  Food Insecurity: No Food Insecurity (03/31/2024)   Hunger Vital Sign    Worried About Running Out of Food in the Last Year: Never true    Ran Out of Food in the Last Year: Never true  Transportation Needs: No Transportation  Needs (03/31/2024)   PRAPARE - Administrator, Civil Service (Medical): No    Lack of Transportation (Non-Medical): No  Physical Activity: Insufficiently Active (03/31/2024)   Exercise Vital Sign    Days of Exercise per Week: 1 day    Minutes of Exercise per Session: 20 min  Stress: No Stress Concern Present (03/31/2024)   [employer] of Occupational Health - Occupational Stress Questionnaire    Feeling of Stress : Not at all  Social Connections: Socially Integrated (03/31/2024)   Social Connection and Isolation Panel [NHANES]    Frequency of Communication with Friends and Family: More than three times a week    Frequency of Social Gatherings with Friends and Family: More than three times a week    Attends Religious Services: More than 4 times per year    Active Member of [employer] or Organizations: Yes    Attends Engineer, structural: More than 4 times per year    Marital Status: Married  Catering manager Violence: Not At Risk (03/31/2024)   Humiliation, Afraid, Rape, and Kick questionnaire    Fear of Current or Ex-Partner: No    Emotionally Abused: No    Physically Abused: No    Sexually Abused: No    Family History  Problem Relation Age of Onset   Colon polyps Mother        brothers x2   Clotting disorder Mother        brother x 2, MGM   Heart disease Brother        mother, MGM   Diabetes Brother        x  2, MGM   Stroke Maternal Grandmother    Colon cancer Neg Hx    Breast cancer Neg Hx    Esophageal cancer Neg Hx    Rectal cancer Neg Hx    Stomach cancer Neg Hx      Review of Systems  Constitutional: Negative.  Negative for chills and fever.  HENT: Negative.  Negative for congestion and sore throat.   Respiratory: Negative.  Negative for cough and shortness of breath.   Cardiovascular: Negative.  Negative for chest pain and palpitations.  Gastrointestinal:  Negative for abdominal pain, diarrhea, nausea and vomiting.  Genitourinary: Negative.  Negative for dysuria and hematuria.  Skin: Negative.  Negative for rash.  Neurological: Negative.  Negative for dizziness and headaches.  All other systems reviewed and are negative.   Vitals:   04/28/24 1414  BP: 134/82  Pulse: 78  Temp: 98.6 F (37 C)  SpO2: 94%    Physical Exam Vitals reviewed.  Constitutional:      Appearance: Normal appearance.  HENT:     Head: Normocephalic.  Eyes:     Extraocular Movements: Extraocular movements intact.  Cardiovascular:     Rate and Rhythm: Normal rate and regular rhythm.     Pulses: Normal pulses.     Heart sounds: Normal heart sounds.  Pulmonary:     Effort: Pulmonary effort is normal.     Breath sounds: Normal breath sounds.  Skin:    General: Skin is warm and dry.     Capillary Refill: Capillary refill takes less than 2 seconds.  Neurological:     General: No focal deficit present.     Mental Status: She is alert and oriented to person, place, and time.  Psychiatric:        Mood and Affect: Mood normal.        Behavior: Behavior  normal.     Results for orders placed or performed in visit on 04/28/24 (from the past 24 hours)  POCT HgB A1C     Status: Abnormal   Collection Time: 04/28/24  2:28 PM  Result Value Ref Range   Hemoglobin A1C 6.5 (A) 4.0 - 5.6 %   HbA1c POC (<> result, manual entry)     HbA1c, POC (prediabetic range)     HbA1c, POC (controlled diabetic range)       ASSESSMENT & PLAN: A total of 42 minutes was spent with the patient and counseling/coordination of care regarding preparing for this visit, review of most recent office visit notes, review of multiple chronic medical conditions and their management, cardiovascular risks associated with hypertension and diabetes, review of all medications and changes made, review of most recent bloodwork results including interpretation of today's hemoglobin A1c, review of health maintenance items, education on nutrition, prognosis, documentation, and need for follow up.   Problem List Items Addressed This Visit       Cardiovascular and Mediastinum   Hypertension associated with diabetes (HCC) - Primary   BP Readings from Last 3 Encounters:  04/28/24 134/82  03/31/24 (!) 142/82  12/25/23 (!) 136/90  Continue valsartan  80 mg daily Well-controlled diabetes with hemoglobin A1c of 6.5 Intolerant to Rybelsus  due to side effects Cardiovascular risks associated with hypertension and diabetes discussed Diet and nutrition discussed Prefers to be on Farxiga .  Will send in a prescription to pharmacy of record today       Relevant Medications   dapagliflozin  propanediol (FARXIGA ) 10 MG TABS tablet   Other Relevant Orders   AMB Referral [facility] Care Management     Endocrine   Dyslipidemia associated with type 2 diabetes mellitus (HCC)   Stable chronic condition Hemoglobin A1c is 6.5 Continues rosuvastatin  40 mg daily Diet and nutrition discussed      Relevant Medications   dapagliflozin  propanediol (FARXIGA ) 10 MG TABS tablet   Other Relevant Orders   POCT HgB A1C (Completed)   AMB Referral [facility] Care Management   Diabetic retinopathy (HCC)   Getting intraocular injections on a regular basis.      Relevant Medications   dapagliflozin  propanediol (FARXIGA ) 10 MG TABS tablet     Musculoskeletal and Integument   Primary osteoarthritis involving multiple joints   Well-controlled pain Advised to  avoid NSAIDs as much as possible Tylenol for pain as needed        Genitourinary   Stage 3a chronic kidney disease (HCC)   Advised to stay well-hydrated and avoid NSAIDs Diet and nutrition discussed Recommend to start Farxiga  10 mg daily      Patient Instructions  Diabetes Mellitus and Nutrition, Adult When you have diabetes, or diabetes mellitus, it is very important to have healthy eating habits because your blood sugar (glucose) levels are greatly affected by what you eat and drink. Eating healthy foods in the right amounts, at about the same times every day, can help you: Manage your blood glucose. Lower your risk of heart disease. Improve your blood pressure. Reach or maintain a healthy weight. What can affect my meal plan? Every person with diabetes is different, and each person has different needs for a meal plan. Your health care provider may recommend that you work with a dietitian to make a meal plan that is best for you. Your meal plan may vary depending on factors such as: The calories you need. The medicines you take. Your weight. Your blood glucose, blood pressure,  and cholesterol levels. Your activity level. Other health conditions you have, such as heart or kidney disease. How do carbohydrates affect me? Carbohydrates, also called carbs, affect your blood glucose level more than any other type of food. Eating carbs raises the amount of glucose in your blood. It is important to know how many carbs you can safely have in each meal. This is different for every person. Your dietitian can help you calculate how many carbs you should have at each meal and for each snack. How does alcohol  affect me? Alcohol  can cause a decrease in blood glucose (hypoglycemia), especially if you use insulin or take certain diabetes medicines by mouth. Hypoglycemia can be a life-threatening condition. Symptoms of hypoglycemia, such as sleepiness, dizziness, and confusion, are similar to  symptoms of having too much alcohol . Do not drink alcohol  if: Your health care provider tells you not to drink. You are pregnant, may be pregnant, or are planning to become pregnant. If you drink alcohol : Limit how much you have to: 0-1 drink a day for women. 0-2 drinks a day for men. Know how much alcohol  is in your drink. In the [place] one drink equals one 12 oz bottle of beer (355 mL), one 5 oz glass of wine (148 mL), or one 1 oz glass of hard liquor (44 mL). Keep yourself hydrated with water, diet soda, or unsweetened iced tea. Keep in mind that regular soda, juice, and other mixers may contain a lot of sugar and must be counted as carbs. What are tips for following this plan?  Reading food labels Start by checking the serving size on the Nutrition Facts label of packaged foods and drinks. The number of calories and the amount of carbs, fats, and other nutrients listed on the label are based on one serving of the item. Many items contain more than one serving per package. Check the total grams (g) of carbs in one serving. Check the number of grams of saturated fats and trans fats in one serving. Choose foods that have a low amount or none of these fats. Check the number of milligrams (mg) of salt (sodium) in one serving. Most people should limit total sodium intake to less than 2,300 mg per day. Always check the nutrition information of foods labeled as "low-fat" or "nonfat." These foods may be higher in added sugar or refined carbs and should be avoided. Talk to your dietitian to identify your daily goals for nutrients listed on the label. Shopping Avoid buying canned, pre-made, or processed foods. These foods tend to be high in fat, sodium, and added sugar. Shop around the outside edge of the grocery store. This is where you will most often find fresh fruits and vegetables, bulk grains, fresh meats, and fresh dairy products. Cooking Use low-heat cooking methods, such as baking, instead  of high-heat cooking methods, such as deep frying. Cook using healthy oils, such as olive, canola, or sunflower oil. Avoid cooking with butter, cream, or high-fat meats. Meal planning Eat meals and snacks regularly, preferably at the same times every day. Avoid going long periods of time without eating. Eat foods that are high in fiber, such as fresh fruits, vegetables, beans, and whole grains. Eat 4-6 oz (112-168 g) of lean protein each day, such as lean meat, chicken, fish, eggs, or tofu. One ounce (oz) (28 g) of lean protein is equal to: 1 oz (28 g) of meat, chicken, or fish. 1 egg.  cup (62 g) of tofu. Eat some foods each  day that contain healthy fats, such as avocado, nuts, seeds, and fish. What foods should I eat? Fruits Berries. Apples. Oranges. Peaches. Apricots. Plums. Grapes. Mangoes. Papayas. Pomegranates. Kiwi. Cherries. Vegetables Leafy greens, including lettuce, spinach, kale, chard, collard greens, mustard greens, and cabbage. Beets. Cauliflower. Broccoli. Carrots. Green beans. Tomatoes. Peppers. Onions. Cucumbers. Brussels sprouts. Grains Whole grains, such as whole-wheat or whole-grain bread, crackers, tortillas, cereal, and pasta. Unsweetened oatmeal. Quinoa. Brown or wild rice. Meats and other proteins Seafood. Poultry without skin. Lean cuts of poultry and beef. Tofu. Nuts. Seeds. Dairy Low-fat or fat-free dairy products such as milk, yogurt, and cheese. The items listed above may not be a complete list of foods and beverages you can eat and drink. Contact a dietitian for more information. What foods should I avoid? Fruits Fruits canned with syrup. Vegetables Canned vegetables. Frozen vegetables with butter or cream sauce. Grains Refined white flour and flour products such as bread, pasta, snack foods, and cereals. Avoid all processed foods. Meats and other proteins Fatty cuts of meat. Poultry with skin. Breaded or fried meats. Processed meat. Avoid saturated  fats. Dairy Full-fat yogurt, cheese, or milk. Beverages Sweetened drinks, such as soda or iced tea. The items listed above may not be a complete list of foods and beverages you should avoid. Contact a dietitian for more information. Questions to ask a health care provider Do I need to meet with a certified diabetes care and education specialist? Do I need to meet with a dietitian? What number can I call if I have questions? When are the best times to check my blood glucose? Where to find more information: American Diabetes Association: diabetes.org Academy of Nutrition and Dietetics: eatright.[employer] of Diabetes and Digestive and Kidney Diseases: StageSync.si Association of Diabetes Care & Education Specialists: diabeteseducator.org Summary It is important to have healthy eating habits because your blood sugar (glucose) levels are greatly affected by what you eat and drink. It is important to use alcohol  carefully. A healthy meal plan will help you manage your blood glucose and lower your risk of heart disease. Your health care provider may recommend that you work with a dietitian to make a meal plan that is best for you. This information is not intended to replace advice given to you by your health care provider. Make sure you discuss any questions you have with your health care provider. Document Revised: 06/29/2020 Document Reviewed: 06/30/2020 Elsevier Patient Education  2024 Elsevier Inc.     Maryagnes Small, MD [facility]

## 2024-04-28 NOTE — Patient Instructions (Signed)

## 2024-04-28 NOTE — Assessment & Plan Note (Signed)
Stable chronic condition.  No complications

## 2024-04-28 NOTE — Assessment & Plan Note (Signed)
 Advised to stay well-hydrated and avoid NSAIDs Diet and nutrition discussed Recommend to start Farxiga  10 mg daily

## 2024-04-28 NOTE — Assessment & Plan Note (Signed)
 Diet and nutrition discussed Benefits of exercise discussed Advised to decrease amount of daily carbohydrate intake and daily calories and increase amount of plant based protein in her diet.

## 2024-04-28 NOTE — Assessment & Plan Note (Signed)
 Stable chronic condition Hemoglobin A1c is 6.5 Continues rosuvastatin  40 mg daily Diet and nutrition discussed

## 2024-04-29 ENCOUNTER — Telehealth: Payer: Self-pay | Admitting: *Deleted

## 2024-04-29 NOTE — Progress Notes (Signed)
 Care Guide Pharmacy Note  04/29/2024 Name: [name] MRN: [id number] DOB: [birth year]  Referred By: Elvira Hammersmith, MD Reason for referral: Complex Care Management (Outreach to schedule referral with pharmacist )   Eileen Jefferson is a 73 y.o. year old female who is a primary care patient of Sagardia, Miguel Jose, MD.  Venus Gilles was referred to the pharmacist for assistance related to: DMII  Successful contact was made with the patient to discuss pharmacy services including being ready for the pharmacist to call at least 5 minutes before the scheduled appointment time and to have medication bottles and any blood pressure readings ready for review. The patient agreed to meet with the pharmacist via telephone visit on 05/12/2024  Kandis Ormond, CMA [place]  [facility], [place] [facility] Guide Direct Dial: [phone]  Fax: [phone] Website: [email or web address]

## 2024-05-12 ENCOUNTER — Telehealth: Payer: Self-pay

## 2024-05-12 ENCOUNTER — Other Ambulatory Visit (INDEPENDENT_AMBULATORY_CARE_PROVIDER_SITE_OTHER): Admitting: Pharmacist

## 2024-05-12 DIAGNOSIS — E1165 Type 2 diabetes mellitus with hyperglycemia: Secondary | ICD-10-CM

## 2024-05-12 DIAGNOSIS — E1159 Type 2 diabetes mellitus with other circulatory complications: Secondary | ICD-10-CM

## 2024-05-12 DIAGNOSIS — E1169 Type 2 diabetes mellitus with other specified complication: Secondary | ICD-10-CM

## 2024-05-12 NOTE — Patient Instructions (Signed)
 It was a pleasure speaking with you today!  We will begin patient assistance application for Farxiga . Come to the office to sign the paperwork at your earliest convenience.  Feel free to call with any questions or concerns!  Rainelle Bur, PharmD, BCPS, CPP Clinical Pharmacist Practitioner [facility] at [facility] [phone]

## 2024-05-12 NOTE — Telephone Encounter (Signed)
 Fill and fax PAP AZ&ME Westley Hammers to rph Cristy pt is coming into office tomorrow.

## 2024-05-12 NOTE — Progress Notes (Signed)
 05/12/2024 Name: Eileen Jefferson MRN: [id number] DOB: [birth year]  Chief Complaint  Patient presents with   Medication Access    Eileen Jefferson is a 73 y.o. year old female who presented for a telephone visit.   They were referred to the pharmacist by their PCP for assistance in managing medication access.    Subjective:  Care Team: Primary Care Provider: Elvira Hammersmith, MD ; Next Scheduled Visit: 09/25/2024   Medication Access/Adherence  Current Pharmacy:  [facility] - [place], [place] - [address] [place] [address] Phone: [phone] Fax: [phone]  [id number] [place], [place] - [address] [place] [address] Phone: [phone] Fax: [phone]   Patient reports affordability concerns with their medications: Yes  Patient reports access/transportation concerns to their pharmacy: No  Patient reports adherence concerns with their medications:  No     Diabetes:  Current medications: none Medications tried in the past: Rybelsus   *Pt has been unable to get Farxiga  because it was ~$400. She also has high cost eye injections and eye drops, making it difficult to afford more high copay meds  Current medication access support: none   Objective:  Lab Results  Component Value Date   HGBA1C 6.5 (A) 04/28/2024    Lab Results  Component Value Date   CREATININE 1.14 09/24/2023   BUN 18 09/24/2023   NA 141 09/24/2023   K 3.6 09/24/2023   CL 106 09/24/2023   CO2 29 09/24/2023    Lab Results  Component Value Date   CHOL 188 09/24/2023   HDL 50.70 09/24/2023   LDLCALC 127 (H) 09/24/2023   TRIG 50.0 09/24/2023   CHOLHDL 4 09/24/2023    Medications Reviewed Today     Reviewed by Dion Frankel, RPH (Pharmacist) on 05/12/24 at 1455  Med List Status: <None>   Medication Order Taking? Sig Documenting Provider Last  Dose Status Informant  ACCU-CHEK AVIVA PLUS test strip [id number]  TEST BLOOD SUGAR ONE TIME DAILY Elvira Hammersmith, MD  Active   Accu-Chek Softclix Lancets lancets [id number]  TEST BLOOD SUGAR ONE TIME DAILY Elvira Hammersmith, MD  Active   Alcohol  Swabs  (DROPSAFE ALCOHOL  PREP) 70 % PADS [id number]  USE TO BLOOD SUGAR EVERY DAY Elvira Hammersmith, MD  Active   allopurinol  (ZYLOPRIM ) 100 MG tablet [id number]  TAKE 1 TABLET EVERY DAY Elvira Hammersmith, MD  Active   aspirin EC 81 MG tablet [id number]  Take 81 mg by mouth daily. [provider]  Active Self  Blood Glucose Calibration (ACCU-CHEK AVIVA) SOLN [id number]  Test blood sugar once daily. Dx: E11.9 Cranston Dk, MD  Active Self           Med Note Yolande Hench May 16, 2021  2:58 PM)    Blood Glucose Monitoring Suppl (ACCU-CHEK AVIVA PLUS) w/Device KIT [id number]  Test blood sugar once daily. Dx: E11.9 Cranston Dk, MD  Active Self           Med Note Yolande Hench May 16, 2021  2:58 PM)    carboxymethylcellulose (REFRESH PLUS) 0.5 % SOLN [id number]  1 drop daily as needed. [provider]  Active   colchicine  0.6 MG tablet [id number]  Take 1 tablet (0.6 mg total) by mouth daily as needed (gout or psuedogout pain). Syliva Even, MD  Active  dapagliflozin  propanediol (FARXIGA ) 10 MG TABS tablet [id number] No Take 1 tablet (10 mg total) by mouth daily before breakfast.  Patient not taking: Reported on 05/12/2024   Sagardia, Miguel Jose, MD Not Taking Active   famotidine  (PEPCID ) 40 MG tablet [id number]  TAKE 1 TABLET (40 MG TOTAL) BY MOUTH 2  TIMES DAILY AS NEEDED FOR HEARTBURN OR INDIGESTION. Sagardia, Miguel Jose, MD  Active   Latanoprost PF (IYUZEH) 0.005 % SOLN [id number]  Apply to eye at bedtime. Drop in each eye at bedtime [provider]  Active   levocetirizine (XYZAL ) 5 MG tablet [id number]  Take 1 tablet (5 mg total) by mouth every evening. Zorita Hiss, FNP  Active   NON FORMULARY  [id number]  Eye injections - Dr Seward Dao - 03/03/24 done, sch'd next one is 04/03/24. [provider]  Active   omeprazole  (PRILOSEC) 40 MG capsule [id number]  TAKE 1 CAPSULE EVERY DAY 30 TO 60 MINUTES BEFORE BREAKFAST Elvira Hammersmith, MD  Active   rosuvastatin  (CRESTOR ) 40 MG tablet [id number]  TAKE 1 TABLET EVERY DAY Elvira Hammersmith, MD  Active   Semaglutide  (RYBELSUS ) 7 MG TABS [id number] No Take 1 tablet (7 mg total) by mouth daily.  Patient not taking: Reported on 05/12/2024   Elvira Hammersmith, MD Not Taking Active   valsartan  (DIOVAN ) 80 MG tablet [id number]  TAKE 1 TABLET EVERY DAY Sagardia, Isidro Margo, MD  Active               Assessment/Plan:   Diabetes: - Currently controlled, A1c goal <7% - Jardiance  is actually the preferred SGLT2 inhibitor with her insurance plan. Price would be $131 for 90 DS after $250 drug deductible. - Farxiga  is non-preferred brand which is 45% coinsurance, making it more expensive than Jardiance   - Other option is to apply for PAP for Farxiga . Pt does believe she meets income criteria. Will get PAP application filled out for her to come sign   Follow Up Plan: await signature on PAP  Rainelle Bur, PharmD, BCPS, CPP Clinical Pharmacist Practitioner [facility] [phone]

## 2024-05-14 DIAGNOSIS — E113511 Type 2 diabetes mellitus with proliferative diabetic retinopathy with macular edema, right eye: Secondary | ICD-10-CM | POA: Diagnosis not present

## 2024-05-14 DIAGNOSIS — H35351 Cystoid macular degeneration, right eye: Secondary | ICD-10-CM | POA: Diagnosis not present

## 2024-05-14 DIAGNOSIS — H3523 Other non-diabetic proliferative retinopathy, bilateral: Secondary | ICD-10-CM | POA: Diagnosis not present

## 2024-05-14 DIAGNOSIS — H35372 Puckering of macula, left eye: Secondary | ICD-10-CM | POA: Diagnosis not present

## 2024-05-14 DIAGNOSIS — H35073 Retinal telangiectasis, bilateral: Secondary | ICD-10-CM | POA: Diagnosis not present

## 2024-05-14 DIAGNOSIS — H401131 Primary open-angle glaucoma, bilateral, mild stage: Secondary | ICD-10-CM | POA: Diagnosis not present

## 2024-05-21 NOTE — Telephone Encounter (Signed)
 Per [employer] pt has been APPROVED from 05/21/2024-12/10/2024 APPROVAL letter is index,spoke with pt and is aware she will be receiving it with in 7-10 day.

## 2024-05-27 ENCOUNTER — Telehealth: Payer: Self-pay

## 2024-05-27 DIAGNOSIS — H35072 Retinal telangiectasis, left eye: Secondary | ICD-10-CM | POA: Diagnosis not present

## 2024-05-27 DIAGNOSIS — H47233 Glaucomatous optic atrophy, bilateral: Secondary | ICD-10-CM | POA: Diagnosis not present

## 2024-05-27 DIAGNOSIS — H50111 Monocular exotropia, right eye: Secondary | ICD-10-CM | POA: Diagnosis not present

## 2024-05-27 DIAGNOSIS — H35071 Retinal telangiectasis, right eye: Secondary | ICD-10-CM | POA: Diagnosis not present

## 2024-05-27 DIAGNOSIS — H2512 Age-related nuclear cataract, left eye: Secondary | ICD-10-CM | POA: Diagnosis not present

## 2024-05-27 DIAGNOSIS — H16223 Keratoconjunctivitis sicca, not specified as Sjogren's, bilateral: Secondary | ICD-10-CM | POA: Diagnosis not present

## 2024-05-27 DIAGNOSIS — H401132 Primary open-angle glaucoma, bilateral, moderate stage: Secondary | ICD-10-CM | POA: Diagnosis not present

## 2024-05-27 DIAGNOSIS — H54413A Blindness right eye category 3, normal vision left eye: Secondary | ICD-10-CM | POA: Diagnosis not present

## 2024-05-27 DIAGNOSIS — G4733 Obstructive sleep apnea (adult) (pediatric): Secondary | ICD-10-CM | POA: Diagnosis not present

## 2024-05-27 NOTE — Telephone Encounter (Signed)
 Received request from [employer] requesting a new rx to be fax to [facility] fax to [phone].

## 2024-05-28 MED ORDER — DAPAGLIFLOZIN PROPANEDIOL 10 MG PO TABS: 10.0000 mg | ORAL_TABLET | Freq: Every day | ORAL | 3 refills | Status: AC

## 2024-05-28 NOTE — Addendum Note (Signed)
 Addended by: Dion Frankel on: 05/28/2024 09:58 AM   Modules accepted: Orders

## 2024-06-16 DIAGNOSIS — E113511 Type 2 diabetes mellitus with proliferative diabetic retinopathy with macular edema, right eye: Secondary | ICD-10-CM | POA: Diagnosis not present

## 2024-06-16 DIAGNOSIS — H401131 Primary open-angle glaucoma, bilateral, mild stage: Secondary | ICD-10-CM | POA: Diagnosis not present

## 2024-06-16 DIAGNOSIS — H35071 Retinal telangiectasis, right eye: Secondary | ICD-10-CM | POA: Diagnosis not present

## 2024-06-16 DIAGNOSIS — H35372 Puckering of macula, left eye: Secondary | ICD-10-CM | POA: Diagnosis not present

## 2024-06-16 DIAGNOSIS — H35351 Cystoid macular degeneration, right eye: Secondary | ICD-10-CM | POA: Diagnosis not present

## 2024-06-16 DIAGNOSIS — H3523 Other non-diabetic proliferative retinopathy, bilateral: Secondary | ICD-10-CM | POA: Diagnosis not present

## 2024-07-03 NOTE — Progress Notes (Unsigned)
 HPI F former smoker followed for OSA, complicated by HBP, GERD, DM2 with retinopathy, Macular degeneration, Cervical DDD, Sarcoid/ Iridocyclitis, Glaucoma, Obesity, PSG 10/11/04>>AHI 16.7, SpO2 low 85% PSG 10/31/12>>AHI 12.9, REM 44.8, SpO2 low 59%, PLMI 0.  HST 01/08/20- AHI 17.1/ hr, desaturation to 70%, body weight 211 lbs  -----------------------------------------------------------------------------------------------   07/05/23- 72 yoF former smoker followed for OSA, complicated by HTN, GERD, DM2 with retinopathy, Macular degeneration, Cervical DDD, Sarcoid/ Iridocyclitis, Glaucoma, Obesity, CPAP auto 5-20/ [facility]  Airsense 10 Download-compliance 97%, AHI 0.5/ hr Body weight today-211 lbs Download reviewed.  She reports doing very well with no specific concerns.  07/04/24- 40 yoF former smoker followed for OSA, complicated by HTN, GERD, DM2 with retinopathy, Macular degeneration, Cervical DDD, Sarcoid/ Iridocyclitis, Glaucoma, Obesity, CPAP auto 5-20/ [facility]  Airsense 10 Download-compliance 90%, AHI 0.4/hr Body weight today-217 lbs Discussed the use of AI scribe software for clinical note transcription with the patient, who gave verbal consent to proceed.  History of Present Illness   Eileen Jefferson is a 73 year old female with sleep apnea who presents for CPAP machine replacement.  She has been using her current CPAP machine for approximately five years. The humidifier reservoir's metal part gets hot, posing a potential safety risk. Her breathing is stable, and her sleep has improved with the current CPAP settings, which range between five and twenty centimeters of water pressure. No new symptoms are present. Download reviewed.     Assessment and Plan:    Obstructive sleep apnea CPAP download showed excellent control with current settings. She preferred switching CPAP supply provider to [facility]. - Maintain current CPAP settings between 5-20 cm H2O pressure. - Order  replacement CPAP machine through [facility].  Obesity -Continue efforts  Follow-up Provider retiring at end of December, assured her of continued care with other sleep doctors.      ROS-see HPI   + = positive Constitutional:    weight loss, night sweats, fevers, chills, fatigue, lassitude. HEENT:    headaches, difficulty swallowing, tooth/dental problems, sore throat,       sneezing, itching, ear ache, nasal congestion, post nasal drip, snoring CV:    chest pain, orthopnea, PND, swelling in lower extremities, anasarca,                                   dizziness, palpitations Resp:   shortness of breath with exertion or at rest.                productive cough,   non-productive cough, coughing up of blood.              change in color of mucus.  wheezing.   Skin:    rash or lesions. GI:    +heartburn, indigestion, abdominal pain, nausea, vomiting, diarrhea,                 change in bowel habits, loss of appetite GU: dysuria, change in color of urine, no urgency or frequency.   flank pain. MS:   joint pain, stiffness, decreased range of motion, back pain. Neuro-     nothing unusual Psych:  change in mood or affect.  depression or anxiety.   memory loss.  OBJ- Physical Exam General- Alert, Oriented, Affect-appropriate, Distress- none acute, + obese Skin- rash-none, lesions- none, excoriation- none Lymphadenopathy- none Head- atraumatic            Eyes- Gross vision intact,  PERRLA, conjunctivae and secretions clear            Ears- Hearing, canals-normal            Nose- Clear, no-Septal dev, mucus, polyps, erosion, perforation             Throat- Mallampati IV , mucosa clear , drainage- none, tonsils- atrophic,  + teeth Neck- flexible , trachea midline, no stridor , thyroid  nl, carotid no bruit Chest - symmetrical excursion , unlabored           Heart/CV- RRR , no murmur , no gallop  , no rub, nl s1 s2                           - JVD- none , edema- none, stasis changes- none,  varices- none           Lung- clear to P&A, wheeze- none, cough- none , dullness-none, rub- none           Chest wall-  Abd-  Br/ Gen/ Rectal- Not done, not indicated Extrem- cyanosis- none, clubbing, none, atrophy- none, strength- nl Neuro- grossly intact to observation

## 2024-07-04 ENCOUNTER — Encounter: Payer: Self-pay | Admitting: Internal Medicine

## 2024-07-04 ENCOUNTER — Ambulatory Visit: Payer: Medicare HMO | Admitting: Internal Medicine

## 2024-07-04 VITALS — BP 134/84 | HR 83 | Temp 98.2°F | Ht 63.0 in | Wt 217.8 lb

## 2024-07-04 DIAGNOSIS — G4733 Obstructive sleep apnea (adult) (pediatric): Secondary | ICD-10-CM

## 2024-07-04 NOTE — Addendum Note (Signed)
 Addended by: Audrie Kuri M on: 07/04/2024 10:25 AM   Modules accepted: Orders

## 2024-07-04 NOTE — Patient Instructions (Signed)
 Order- patient requests change to [facility]. She is due to replace old machine, 73 yrs old and humidifier tray overheating. Continue auto 5-20, mask of choice, heated humidifier, supplies and download capability AirView/ SD card

## 2024-07-17 DIAGNOSIS — H3523 Other non-diabetic proliferative retinopathy, bilateral: Secondary | ICD-10-CM | POA: Diagnosis not present

## 2024-07-17 DIAGNOSIS — H35372 Puckering of macula, left eye: Secondary | ICD-10-CM | POA: Diagnosis not present

## 2024-07-17 DIAGNOSIS — H35351 Cystoid macular degeneration, right eye: Secondary | ICD-10-CM | POA: Diagnosis not present

## 2024-07-17 DIAGNOSIS — H35073 Retinal telangiectasis, bilateral: Secondary | ICD-10-CM | POA: Diagnosis not present

## 2024-07-17 DIAGNOSIS — E113511 Type 2 diabetes mellitus with proliferative diabetic retinopathy with macular edema, right eye: Secondary | ICD-10-CM | POA: Diagnosis not present

## 2024-07-17 DIAGNOSIS — H401131 Primary open-angle glaucoma, bilateral, mild stage: Secondary | ICD-10-CM | POA: Diagnosis not present

## 2024-08-09 ENCOUNTER — Other Ambulatory Visit: Payer: Self-pay | Admitting: Emergency Medicine

## 2024-08-09 DIAGNOSIS — I152 Hypertension secondary to endocrine disorders: Secondary | ICD-10-CM

## 2024-08-09 DIAGNOSIS — E1165 Type 2 diabetes mellitus with hyperglycemia: Secondary | ICD-10-CM

## 2024-08-09 DIAGNOSIS — E785 Hyperlipidemia, unspecified: Secondary | ICD-10-CM

## 2024-08-25 DIAGNOSIS — H35351 Cystoid macular degeneration, right eye: Secondary | ICD-10-CM | POA: Diagnosis not present

## 2024-08-25 DIAGNOSIS — E113511 Type 2 diabetes mellitus with proliferative diabetic retinopathy with macular edema, right eye: Secondary | ICD-10-CM | POA: Diagnosis not present

## 2024-08-25 DIAGNOSIS — H35372 Puckering of macula, left eye: Secondary | ICD-10-CM | POA: Diagnosis not present

## 2024-08-25 DIAGNOSIS — H3523 Other non-diabetic proliferative retinopathy, bilateral: Secondary | ICD-10-CM | POA: Diagnosis not present

## 2024-08-25 DIAGNOSIS — H35073 Retinal telangiectasis, bilateral: Secondary | ICD-10-CM | POA: Diagnosis not present

## 2024-08-25 DIAGNOSIS — H401131 Primary open-angle glaucoma, bilateral, mild stage: Secondary | ICD-10-CM | POA: Diagnosis not present

## 2024-08-27 ENCOUNTER — Other Ambulatory Visit: Payer: Self-pay | Admitting: Emergency Medicine

## 2024-08-27 DIAGNOSIS — K219 Gastro-esophageal reflux disease without esophagitis: Secondary | ICD-10-CM

## 2024-09-23 ENCOUNTER — Telehealth: Payer: Self-pay

## 2024-09-23 NOTE — Telephone Encounter (Signed)
 Gave pt a call pt has been pre-approve for [employer] Farxiga ,pt has to call [employer] and give consent to re-enrollment 2026,spoke with pt gave [employer] number to call,said she will be calling today to give consent.

## 2024-09-25 ENCOUNTER — Encounter: Admitting: Emergency Medicine

## 2024-09-27 ENCOUNTER — Other Ambulatory Visit: Payer: Self-pay | Admitting: Emergency Medicine

## 2024-09-27 DIAGNOSIS — E1169 Type 2 diabetes mellitus with other specified complication: Secondary | ICD-10-CM

## 2024-09-30 ENCOUNTER — Ambulatory Visit: Payer: Self-pay | Admitting: Emergency Medicine

## 2024-09-30 ENCOUNTER — Ambulatory Visit: Admitting: Emergency Medicine

## 2024-09-30 VITALS — BP 130/80 | HR 69 | Temp 98.5°F | Ht 63.0 in | Wt 213.8 lb

## 2024-09-30 DIAGNOSIS — N1831 Chronic kidney disease, stage 3a: Secondary | ICD-10-CM | POA: Diagnosis not present

## 2024-09-30 DIAGNOSIS — K219 Gastro-esophageal reflux disease without esophagitis: Secondary | ICD-10-CM | POA: Diagnosis not present

## 2024-09-30 DIAGNOSIS — E1159 Type 2 diabetes mellitus with other circulatory complications: Secondary | ICD-10-CM | POA: Diagnosis not present

## 2024-09-30 DIAGNOSIS — E113511 Type 2 diabetes mellitus with proliferative diabetic retinopathy with macular edema, right eye: Secondary | ICD-10-CM

## 2024-09-30 DIAGNOSIS — D869 Sarcoidosis, unspecified: Secondary | ICD-10-CM

## 2024-09-30 DIAGNOSIS — Z0000 Encounter for general adult medical examination without abnormal findings: Secondary | ICD-10-CM

## 2024-09-30 DIAGNOSIS — E1169 Type 2 diabetes mellitus with other specified complication: Secondary | ICD-10-CM

## 2024-09-30 DIAGNOSIS — I152 Hypertension secondary to endocrine disorders: Secondary | ICD-10-CM

## 2024-09-30 DIAGNOSIS — Z130 Encounter for screening for diseases of the blood and blood-forming organs and certain disorders involving the immune mechanism: Secondary | ICD-10-CM

## 2024-09-30 DIAGNOSIS — E785 Hyperlipidemia, unspecified: Secondary | ICD-10-CM | POA: Diagnosis not present

## 2024-09-30 DIAGNOSIS — Z13228 Encounter for screening for other metabolic disorders: Secondary | ICD-10-CM | POA: Diagnosis not present

## 2024-09-30 DIAGNOSIS — Z0001 Encounter for general adult medical examination with abnormal findings: Secondary | ICD-10-CM

## 2024-09-30 DIAGNOSIS — Z1329 Encounter for screening for other suspected endocrine disorder: Secondary | ICD-10-CM | POA: Diagnosis not present

## 2024-09-30 DIAGNOSIS — M150 Primary generalized (osteo)arthritis: Secondary | ICD-10-CM

## 2024-09-30 LAB — CBC WITH DIFFERENTIAL/PLATELET
Basophils Absolute: 0.1 K/uL (ref 0.0–0.1)
Basophils Relative: 0.9 % (ref 0.0–3.0)
Eosinophils Absolute: 0.2 K/uL (ref 0.0–0.7)
Eosinophils Relative: 2.5 % (ref 0.0–5.0)
HCT: 35.1 % — ABNORMAL LOW (ref 36.0–46.0)
Hemoglobin: 10.7 g/dL — ABNORMAL LOW (ref 12.0–15.0)
Lymphocytes Relative: 26.1 % (ref 12.0–46.0)
Lymphs Abs: 1.6 K/uL (ref 0.7–4.0)
MCHC: 30.6 g/dL (ref 30.0–36.0)
MCV: 68.1 fl — ABNORMAL LOW (ref 78.0–100.0)
Monocytes Absolute: 0.2 K/uL (ref 0.1–1.0)
Monocytes Relative: 3.7 % (ref 3.0–12.0)
Neutro Abs: 4.1 K/uL (ref 1.4–7.7)
Neutrophils Relative %: 66.8 % (ref 43.0–77.0)
Platelets: 314 K/uL (ref 150.0–400.0)
RBC: 5.16 Mil/uL — ABNORMAL HIGH (ref 3.87–5.11)
RDW: 17.4 % — ABNORMAL HIGH (ref 11.5–15.5)
WBC: 6.2 K/uL (ref 4.0–10.5)

## 2024-09-30 LAB — HEMOGLOBIN A1C: Hgb A1c MFr Bld: 6.7 % — ABNORMAL HIGH (ref 4.6–6.5)

## 2024-09-30 LAB — MICROALBUMIN / CREATININE URINE RATIO
Creatinine,U: 237.5 mg/dL
Microalb Creat Ratio: 78.3 mg/g — ABNORMAL HIGH (ref 0.0–30.0)
Microalb, Ur: 18.6 mg/dL — ABNORMAL HIGH (ref 0.0–1.9)

## 2024-09-30 LAB — LIPID PANEL
Cholesterol: 167 mg/dL (ref 0–200)
HDL: 41.9 mg/dL (ref >39.00)
LDL Cholesterol: 108 mg/dL — ABNORMAL HIGH (ref 0–99)
NonHDL: 124.97
Total CHOL/HDL Ratio: 4
Triglycerides: 87 mg/dL (ref 0.0–149.0)
VLDL: 17.4 mg/dL (ref 0.0–40.0)

## 2024-09-30 LAB — COMPREHENSIVE METABOLIC PANEL WITH GFR
ALT: 11 U/L (ref 0–35)
AST: 13 U/L (ref 0–37)
Albumin: 4.1 g/dL (ref 3.5–5.2)
Alkaline Phosphatase: 102 U/L (ref 39–117)
BUN: 17 mg/dL (ref 6–23)
CO2: 29 meq/L (ref 19–32)
Calcium: 9.4 mg/dL (ref 8.4–10.5)
Chloride: 106 meq/L (ref 96–112)
Creatinine, Ser: 1.04 mg/dL (ref 0.40–1.20)
GFR: 54.11 mL/min — ABNORMAL LOW (ref >60.00)
Glucose, Bld: 98 mg/dL (ref 70–99)
Potassium: 3.8 meq/L (ref 3.5–5.1)
Sodium: 141 meq/L (ref 135–145)
Total Bilirubin: 0.3 mg/dL (ref 0.2–1.2)
Total Protein: 7.7 g/dL (ref 6.0–8.3)

## 2024-09-30 NOTE — Assessment & Plan Note (Signed)
 Advised to stay well-hydrated and avoid NSAIDs Diet and nutrition discussed Continue Farxiga  10 mg daily

## 2024-09-30 NOTE — Assessment & Plan Note (Signed)
Stable chronic condition.  No complications

## 2024-09-30 NOTE — Progress Notes (Signed)
 Eileen Jefferson 73 y.o.   Chief Complaint  Patient presents with   Annual Exam    Fasting CPE. Flu vaccine completed at [facility]    HISTORY OF PRESENT ILLNESS: This is a 73 y.o. female here for annual exam and follow-up on chronic medical conditions including hypertension, diabetes, and dyslipidemia Overall doing well. Has no complaints or any other medical concerns. Lab Results  Component Value Date   HGBA1C 6.5 (A) 04/28/2024   BP Readings from Last 3 Encounters:  09/30/24 130/80  07/04/24 134/84  04/28/24 134/82   Wt Readings from Last 3 Encounters:  09/30/24 213 lb 12.8 oz (97 kg)  07/04/24 217 lb 12.8 oz (98.8 kg)  04/28/24 215 lb (97.5 kg)     HPI   Prior to Admission medications   Medication Sig Start Date End Date Taking? Authorizing Provider  ACCU-CHEK AVIVA PLUS test strip TEST BLOOD SUGAR ONE TIME DAILY 08/09/24  Yes Purcell Emil Schanz, MD  Accu-Chek Softclix Lancets lancets TEST BLOOD SUGAR ONE TIME DAILY 08/09/24  Yes Soraida Vickers Jose, MD  Alcohol  Swabs  (DROPSAFE ALCOHOL  PREP) 70 % PADS USE TO BLOOD SUGAR EVERY DAY 10/19/22  Yes Maleke Feria Jose, MD  allopurinol  (ZYLOPRIM ) 100 MG tablet TAKE 1 TABLET EVERY DAY 01/03/24  Yes Lyndsey Demos Jose, MD  aspirin EC 81 MG tablet Take 81 mg by mouth daily.   Yes [provider]  Blood Glucose Calibration (ACCU-CHEK AVIVA) SOLN Test blood sugar once daily. Dx: E11.9 10/25/16  Yes Loreli Elyn SAILOR, MD  Blood Glucose Monitoring Suppl (ACCU-CHEK AVIVA PLUS) w/Device KIT Test blood sugar once daily. Dx: E11.9 10/25/16  Yes Loreli Elyn SAILOR, MD  carboxymethylcellulose (REFRESH PLUS) 0.5 % SOLN 1 drop daily as needed.   Yes [provider]  colchicine  0.6 MG tablet Take 1 tablet (0.6 mg total) by mouth daily as needed (gout or psuedogout pain). 10/18/21  Yes Corey, Evan S, MD  dapagliflozin  propanediol (FARXIGA ) 10 MG TABS tablet Take 1 tablet (10 mg total) by mouth daily before breakfast. 05/28/24  Yes  Jaiden Wahab, Emil Schanz, MD  famotidine  (PEPCID ) 40 MG tablet TAKE 1 TABLET (40 MG TOTAL) BY MOUTH 2  TIMES DAILY AS NEEDED FOR HEARTBURN OR INDIGESTION. 12/25/23  Yes Meiko Stranahan, Emil Schanz, MD  ketorolac (ACULAR) 0.4 % SOLN Place 1 drop into the right eye 4 (four) times daily. 05/12/24  Yes [provider]  Latanoprost PF (IYUZEH) 0.005 % SOLN Apply to eye at bedtime. Drop in each eye at bedtime   Yes [provider]  levocetirizine (XYZAL ) 5 MG tablet Take 1 tablet (5 mg total) by mouth every evening. 03/31/24  Yes Merlynn Niki FALCON, FNP  NON FORMULARY Eye injections - Dr Elner - 03/03/24 done, sch'd next one is 04/03/24.   Yes [provider]  omeprazole  (PRILOSEC) 40 MG capsule TAKE 1 CAPSULE EVERY DAY 30 TO 60 MINUTES BEFORE BREAKFAST 08/27/24  Yes Jeniffer Culliver, Emil Schanz, MD  rosuvastatin  (CRESTOR ) 40 MG tablet TAKE 1 TABLET EVERY DAY 09/27/24  Yes Meighan Treto Jose, MD  valsartan  (DIOVAN ) 80 MG tablet TAKE 1 TABLET EVERY DAY 08/09/24  Yes Stassi Fadely Jose, MD    Allergies  Allergen Reactions   Other     Symbrinza - eyes red and runny    Contrast Media [Iodinated Contrast Media]    Iohexol Other (See Comments)     made me feel like I was burning inside    Patient Active Problem List   Diagnosis Date  Noted   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, without long-term current use of insulin (HCC) 09/30/2024   Primary osteoarthritis involving multiple joints 11/06/2023   Cervical radiculopathy 10/16/2023   Diabetes (HCC) 09/25/2021   Stage 3a chronic kidney disease (HCC) 11/08/2020   Retinal telangiectasia of right eye 05/18/2020   Retinal telangiectasia of left eye 05/18/2020   Cystoid macular edema of left eye 05/18/2020   Hyperuricemia 04/27/2020   Gastroesophageal reflux disease 11/23/2018   DDD (degenerative disc disease), cervical 07/11/2017   Localized osteoarthrosis of right shoulder region  07/11/2017   Anemia 07/11/2017   Hyperlipidemia 06/28/2016   Thyroid  disease 06/28/2016   Diabetic retinopathy (HCC) 05/05/2015   Class 2 severe obesity due to excess calories with serious comorbidity and body mass index (BMI) of 38.0 to 38.9 in adult 01/11/2015   Glaucoma 04/06/2014   Macular degeneration 04/06/2014   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, 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   Hypertension associated with diabetes (HCC) 10/02/2013   Sarcoidosis 10/02/2013   Dyslipidemia associated with type 2 diabetes mellitus (HCC) 10/02/2013   OSA (obstructive sleep apnea) 10/23/2012    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   Iron deficiency anemia    Pulmonary sarcoidosis    Sleep apnea    uses CPAP   Thyroid  disease    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)     Past Surgical History:  Procedure Laterality Date   ABDOMINAL HYSTERECTOMY N/A    [facility] 11/14/2020   APPENDECTOMY     c-section     x 2   CATARACT EXTRACTION     bilateral   CESAREAN SECTION N/A    [facility] 11/14/2020   COLONOSCOPY     EYE SURGERY N/A    [facility] 11/14/2020   SCLERAL BUCKLE     right   TONSILLECTOMY     TOTAL ABDOMINAL HYSTERECTOMY     TUBAL LIGATION     VITRECTOMY     bilateral    Social History   Socioeconomic History   Marital status: Married    Spouse name: Not on file   Number of children: 2   Years of education: Not on file   Highest education level: Master's degree (e.g., MA, MS, MEng, MEd, MSW, MBA)  Occupational History   Occupation: behavioral health  Tobacco Use   Smoking status: Former    Current packs/day: 0.00     Average packs/day: 0.2 packs/day for 1 year (0.2 ttl pk-yrs)    Types: Cigarettes    Start date: 12/12/1967    Quit date: 12/11/1968    Years since quitting: 55.8    Passive exposure: Past   Smokeless tobacco: Never  Vaping Use   Vaping status: Never Used  Substance and Sexual Activity   Alcohol  use: No   Drug use: No   Sexual activity: Not on file  Other Topics Concern   Not on file  Social History Narrative   Married   Social Drivers of Health   Financial Resource Strain: High Risk (09/30/2024)   Overall Financial Resource Strain (CARDIA)    Difficulty  of Paying Living Expenses: Hard  Food Insecurity: No Food Insecurity (09/30/2024)   Hunger Vital Sign    Worried About Running Out of Food in the Last Year: Never true    Ran Out of Food in the Last Year: Never true  Transportation Needs: No Transportation Needs (09/30/2024)   PRAPARE - Administrator, Civil Service (Medical): No    Lack of Transportation (Non-Medical): No  Physical Activity: Inactive (09/30/2024)   Exercise Vital Sign    Days of Exercise per Week: 0 days    Minutes of Exercise per Session: Not on file  Stress: No Stress Concern Present (09/30/2024)   [employer] of Occupational Health - Occupational Stress Questionnaire    Feeling of Stress: Only a little  Social Connections: Socially Integrated (09/30/2024)   Social Connection and Isolation Panel    Frequency of Communication with Friends and Family: More than three times a week    Frequency of Social Gatherings with Friends and Family: Three times a week    Attends Religious Services: More than 4 times per year    Active Member of Clubs or Organizations: Yes    Attends Banker Meetings: More than 4 times per year    Marital Status: Married  Catering manager Violence: Not At Risk (03/31/2024)   Humiliation, Afraid, Rape, and Kick questionnaire    Fear of Current or Ex-Partner: No    Emotionally Abused: No    Physically  Abused: No    Sexually Abused: No    Family History  Problem Relation Age of Onset   Colon polyps Mother        brothers x2   Clotting disorder Mother        brother x 2, MGM   Heart disease Brother        mother, MGM   Diabetes Brother        x 2, MGM   Stroke Maternal Grandmother    Colon cancer Neg Hx    Breast cancer Neg Hx    Esophageal cancer Neg Hx    Rectal cancer Neg Hx    Stomach cancer Neg Hx      Review of Systems  Constitutional: Negative.  Negative for chills and fever.  HENT: Negative.  Negative for congestion and sore throat.   Respiratory: Negative.  Negative for cough and shortness of breath.   Cardiovascular: Negative.  Negative for chest pain and palpitations.  Gastrointestinal:  Negative for abdominal pain, diarrhea, nausea and vomiting.  Genitourinary: Negative.  Negative for dysuria and hematuria.  Skin: Negative.  Negative for rash.  Neurological: Negative.  Negative for dizziness and headaches.  All other systems reviewed and are negative.   Vitals:   09/30/24 1018  BP: 130/80  Pulse: 69  Temp: 98.5 F (36.9 C)  SpO2: 98%    Physical Exam Vitals reviewed.  Constitutional:      Appearance: Normal appearance.  HENT:     Head: Normocephalic.     Mouth/Throat:     Mouth: Mucous membranes are moist.     Pharynx: Oropharynx is clear.  Eyes:     Extraocular Movements: Extraocular movements intact.     Pupils: Pupils are equal, round, and reactive to light.  Cardiovascular:     Rate and Rhythm: Normal rate and regular rhythm.     Pulses: Normal pulses.     Heart sounds: Normal heart sounds.  Pulmonary:     Effort: Pulmonary effort is normal.  Breath sounds: Normal breath sounds.  Abdominal:     Palpations: Abdomen is soft.     Tenderness: There is no abdominal tenderness.  Musculoskeletal:     Cervical back: No tenderness.  Lymphadenopathy:     Cervical: No cervical adenopathy.  Skin:    General: Skin is warm and dry.      Capillary Refill: Capillary refill takes less than 2 seconds.  Neurological:     General: No focal deficit present.     Mental Status: She is alert and oriented to person, place, and time.  Psychiatric:        Mood and Affect: Mood normal.        Behavior: Behavior normal.      ASSESSMENT & PLAN: Problem List Items Addressed This Visit       Cardiovascular and Mediastinum   Hypertension associated with diabetes (HCC)   BP Readings from Last 3 Encounters:  09/30/24 130/80  07/04/24 134/84  04/28/24 134/82   Lab Results  Component Value Date   HGBA1C 6.5 (A) 04/28/2024  Continue valsartan  80 mg daily Well-controlled diabetes with hemoglobin A1c of 6.5 Intolerant to Rybelsus  due to side effects Cardiovascular risks associated with hypertension and diabetes discussed Diet and nutrition discussed Continue Farxiga  10 mg daily       Relevant Orders   Microalbumin / creatinine urine ratio   CBC with Differential/Platelet   Comprehensive metabolic panel with GFR   Hemoglobin A1c   Lipid panel     Digestive   Gastroesophageal reflux disease   Clinically stable and asymptomatic Continues omeprazole  40 mg daily as needed        Endocrine   Dyslipidemia associated with type 2 diabetes mellitus (HCC)   Stable chronic condition Hemoglobin A1c is 6.5 Continues rosuvastatin  40 mg daily Diet and nutrition discussed      Relevant Orders   Microalbumin / creatinine urine ratio   CBC with Differential/Platelet   Comprehensive metabolic panel with GFR   Hemoglobin A1c   Lipid panel   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  Office notes reviewed. Clinically stable.  Getting intraocular injections which are helping      Relevant Orders   Hemoglobin A1c     Musculoskeletal and Integument   Primary osteoarthritis involving multiple  joints   Well-controlled pain Advised to avoid NSAIDs as much as possible Tylenol for pain as needed        Genitourinary   Stage 3a chronic kidney disease (HCC)   Advised to stay well-hydrated and avoid NSAIDs Diet and nutrition discussed Continue Farxiga  10 mg daily      Relevant Orders   Microalbumin / creatinine urine ratio   Comprehensive metabolic panel with GFR     Other   Sarcoidosis   Stable chronic condition.  No complications.      Other Visit Diagnoses       Encounter for general adult medical examination with abnormal findings    -  Primary   Relevant Orders   Microalbumin / creatinine urine ratio   CBC with Differential/Platelet   Comprehensive metabolic panel with GFR   Hemoglobin A1c   Lipid panel     Screening for deficiency anemia       Relevant Orders   CBC with Differential/Platelet     Screening for endocrine, metabolic and immunity disorder  Relevant Orders   Comprehensive metabolic panel with GFR      Modifiable risk factors discussed with patient. Anticipatory guidance according to age provided. The following topics were also discussed: Social Determinants of Health Smoking.  Non-smoker Diet and nutrition Benefits of exercise Cancer screening and review of colonoscopy report from 2023 and mammogram report 2025 Vaccinations review and recommendations Cardiovascular risk assessment and need for blood work today The 10-year ASCVD risk score (Arnett DK, et al., 2019) is: 26.6%   Values used to calculate the score:     Age: 61 years     Clincally relevant sex: Female     Is Non-Hispanic African American: Yes     Diabetic: Yes     Tobacco smoker: No     Systolic Blood Pressure: 130 mmHg     Is BP treated: Yes     HDL Cholesterol: 50.7 mg/dL     Total Cholesterol: 188 mg/dL Review of multiple chronic medical conditions under management Review of all medications Mental health including depression and anxiety Fall and accident  prevention  Patient Instructions  Health Maintenance, Female Adopting a healthy lifestyle and getting preventive care are important in promoting health and wellness. Ask your health care provider about: The right schedule for you to have regular tests and exams. Things you can do on your own to prevent diseases and keep yourself healthy. What should I know about diet, weight, and exercise? Eat a healthy diet  Eat a diet that includes plenty of vegetables, fruits, low-fat dairy products, and lean protein. Do not eat a lot of foods that are high in solid fats, added sugars, or sodium. Maintain a healthy weight Body mass index (BMI) is used to identify weight problems. It estimates body fat based on height and weight. Your health care provider can help determine your BMI and help you achieve or maintain a healthy weight. Get regular exercise Get regular exercise. This is one of the most important things you can do for your health. Most adults should: Exercise for at least 150 minutes each week. The exercise should increase your heart rate and make you sweat (moderate-intensity exercise). Do strengthening exercises at least twice a week. This is in addition to the moderate-intensity exercise. Spend less time sitting. Even light physical activity can be beneficial. Watch cholesterol and blood lipids Have your blood tested for lipids and cholesterol at 73 years of age, then have this test every 5 years. Have your cholesterol levels checked more often if: Your lipid or cholesterol levels are high. You are older than 71 years of age. You are at high risk for heart disease. What should I know about cancer screening? Depending on your health history and family history, you may need to have cancer screening at various ages. This may include screening for: Breast cancer. Cervical cancer. Colorectal cancer. Skin cancer. Lung cancer. What should I know about heart disease, diabetes, and high blood  pressure? Blood pressure and heart disease High blood pressure causes heart disease and increases the risk of stroke. This is more likely to develop in people who have high blood pressure readings or are overweight. Have your blood pressure checked: Every 3-5 years if you are 74-110 years of age. Every year if you are 70 years old or older. Diabetes Have regular diabetes screenings. This checks your fasting blood sugar level. Have the screening done: Once every three years after age 39 if you are at a normal weight and have a low risk for diabetes.  More often and at a younger age if you are overweight or have a high risk for diabetes. What should I know about preventing infection? Hepatitis B If you have a higher risk for hepatitis B, you should be screened for this virus. Talk with your health care provider to find out if you are at risk for hepatitis B infection. Hepatitis C Testing is recommended for: Everyone born from [birth year] through 1963. Anyone with known risk factors for hepatitis C. Sexually transmitted infections (STIs) Get screened for STIs, including gonorrhea and chlamydia, if: You are sexually active and are younger than 71 years of age. You are older than 71 years of age and your health care provider tells you that you are at risk for this type of infection. Your sexual activity has changed since you were last screened, and you are at increased risk for chlamydia or gonorrhea. Ask your health care provider if you are at risk. Ask your health care provider about whether you are at high risk for HIV. Your health care provider may recommend a prescription medicine to help prevent HIV infection. If you choose to take medicine to prevent HIV, you should first get tested for HIV. You should then be tested every 3 months for as long as you are taking the medicine. Pregnancy If you are about to stop having your period (premenopausal) and you may become pregnant, seek counseling before you  get pregnant. Take 400 to 800 micrograms (mcg) of folic acid every day if you become pregnant. Ask for birth control (contraception) if you want to prevent pregnancy. Osteoporosis and menopause Osteoporosis is a disease in which the bones lose minerals and strength with aging. This can result in bone fractures. If you are 14 years old or older, or if you are at risk for osteoporosis and fractures, ask your health care provider if you should: Be screened for bone loss. Take a calcium  or vitamin D supplement to lower your risk of fractures. Be given hormone replacement therapy (HRT) to treat symptoms of menopause. Follow these instructions at home: Alcohol  use Do not drink alcohol  if: Your health care provider tells you not to drink. You are pregnant, may be pregnant, or are planning to become pregnant. If you drink alcohol : Limit how much you have to: 0-1 drink a day. Know how much alcohol  is in your drink. In the [place] one drink equals one 12 oz bottle of beer (355 mL), one 5 oz glass of wine (148 mL), or one 1 oz glass of hard liquor (44 mL). Lifestyle Do not use any products that contain nicotine or tobacco. These products include cigarettes, chewing tobacco, and vaping devices, such as e-cigarettes. If you need help quitting, ask your health care provider. Do not use street drugs. Do not share needles. Ask your health care provider for help if you need support or information about quitting drugs. General instructions Schedule regular health, dental, and eye exams. Stay current with your vaccines. Tell your health care provider if: You often feel depressed. You have ever been abused or do not feel safe at home. Summary Adopting a healthy lifestyle and getting preventive care are important in promoting health and wellness. Follow your health care provider's instructions about healthy diet, exercising, and getting tested or screened for diseases. Follow your health care provider's  instructions on monitoring your cholesterol and blood pressure. This information is not intended to replace advice given to you by your health care provider. Make sure you discuss any questions you  have with your health care provider. Document Revised: 04/18/2021 Document Reviewed: 04/18/2021 Elsevier Patient Education  2024 Elsevier Inc.      Emil Schaumann, MD [facility]

## 2024-09-30 NOTE — Assessment & Plan Note (Addendum)
 Last ophthalmology visit 07/17/2024.  Office notes reviewed. Clinically stable.  Getting intraocular injections which are helping

## 2024-09-30 NOTE — Assessment & Plan Note (Signed)
Clinically stable and asymptomatic Continues omeprazole 40 mg daily as needed

## 2024-09-30 NOTE — Assessment & Plan Note (Signed)
 Stable chronic condition Hemoglobin A1c is 6.5 Continues rosuvastatin  40 mg daily Diet and nutrition discussed

## 2024-09-30 NOTE — Assessment & Plan Note (Signed)
 Well-controlled pain Advised to avoid NSAIDs as much as possible Tylenol for pain as needed

## 2024-09-30 NOTE — Patient Instructions (Signed)

## 2024-09-30 NOTE — Assessment & Plan Note (Signed)
 BP Readings from Last 3 Encounters:  09/30/24 130/80  07/04/24 134/84  04/28/24 134/82   Lab Results  Component Value Date   HGBA1C 6.5 (A) 04/28/2024  Continue valsartan  80 mg daily Well-controlled diabetes with hemoglobin A1c of 6.5 Intolerant to Rybelsus  due to side effects Cardiovascular risks associated with hypertension and diabetes discussed Diet and nutrition discussed Continue Farxiga  10 mg daily

## 2024-10-01 ENCOUNTER — Encounter: Payer: Self-pay | Admitting: Ophthalmology

## 2024-10-01 DIAGNOSIS — H35071 Retinal telangiectasis, right eye: Secondary | ICD-10-CM | POA: Diagnosis not present

## 2024-10-01 DIAGNOSIS — H35072 Retinal telangiectasis, left eye: Secondary | ICD-10-CM | POA: Diagnosis not present

## 2024-10-01 DIAGNOSIS — H35372 Puckering of macula, left eye: Secondary | ICD-10-CM | POA: Diagnosis not present

## 2024-10-01 DIAGNOSIS — H401131 Primary open-angle glaucoma, bilateral, mild stage: Secondary | ICD-10-CM | POA: Diagnosis not present

## 2024-10-01 DIAGNOSIS — E113511 Type 2 diabetes mellitus with proliferative diabetic retinopathy with macular edema, right eye: Secondary | ICD-10-CM | POA: Diagnosis not present

## 2024-10-01 DIAGNOSIS — H3523 Other non-diabetic proliferative retinopathy, bilateral: Secondary | ICD-10-CM | POA: Diagnosis not present

## 2024-10-01 DIAGNOSIS — Z961 Presence of intraocular lens: Secondary | ICD-10-CM | POA: Diagnosis not present

## 2024-10-01 DIAGNOSIS — H35351 Cystoid macular degeneration, right eye: Secondary | ICD-10-CM | POA: Diagnosis not present

## 2024-10-01 LAB — OPHTHALMOLOGY REPORT-SCANNED

## 2024-10-08 ENCOUNTER — Other Ambulatory Visit (HOSPITAL_COMMUNITY): Payer: Self-pay

## 2024-10-24 ENCOUNTER — Other Ambulatory Visit: Payer: Self-pay | Admitting: Emergency Medicine

## 2024-10-24 DIAGNOSIS — E790 Hyperuricemia without signs of inflammatory arthritis and tophaceous disease: Secondary | ICD-10-CM

## 2024-10-29 ENCOUNTER — Ambulatory Visit: Admitting: Emergency Medicine

## 2024-11-05 DIAGNOSIS — H35372 Puckering of macula, left eye: Secondary | ICD-10-CM | POA: Diagnosis not present

## 2024-11-05 DIAGNOSIS — H3523 Other non-diabetic proliferative retinopathy, bilateral: Secondary | ICD-10-CM | POA: Diagnosis not present

## 2024-11-05 DIAGNOSIS — H35351 Cystoid macular degeneration, right eye: Secondary | ICD-10-CM | POA: Diagnosis not present

## 2024-11-05 DIAGNOSIS — E113511 Type 2 diabetes mellitus with proliferative diabetic retinopathy with macular edema, right eye: Secondary | ICD-10-CM | POA: Diagnosis not present

## 2024-11-05 DIAGNOSIS — H35073 Retinal telangiectasis, bilateral: Secondary | ICD-10-CM | POA: Diagnosis not present

## 2024-11-05 DIAGNOSIS — H401131 Primary open-angle glaucoma, bilateral, mild stage: Secondary | ICD-10-CM | POA: Diagnosis not present

## 2024-12-16 LAB — OPHTHALMOLOGY REPORT-SCANNED

## 2024-12-22 ENCOUNTER — Other Ambulatory Visit: Payer: Self-pay | Admitting: Emergency Medicine

## 2024-12-22 DIAGNOSIS — Z1231 Encounter for screening mammogram for malignant neoplasm of breast: Secondary | ICD-10-CM

## 2024-12-23 ENCOUNTER — Inpatient Hospital Stay: Admission: RE | Admit: 2024-12-23 | Discharge: 2024-12-23 | Attending: Emergency Medicine

## 2024-12-23 DIAGNOSIS — Z1231 Encounter for screening mammogram for malignant neoplasm of breast: Secondary | ICD-10-CM

## 2025-04-02 ENCOUNTER — Ambulatory Visit

## 2025-10-01 ENCOUNTER — Encounter: Admitting: Emergency Medicine
